# Patient Record
Sex: Female | Born: 1945
Health system: Southern US, Community
[De-identification: ages and names within clinical notes are randomized; demographics above are authoritative.]

## PROBLEM LIST (undated history)

## (undated) DIAGNOSIS — Z803 Family history of malignant neoplasm of breast: Secondary | ICD-10-CM

## (undated) DIAGNOSIS — A599 Trichomoniasis, unspecified: Secondary | ICD-10-CM

## (undated) DIAGNOSIS — N882 Stricture and stenosis of cervix uteri: Secondary | ICD-10-CM

## (undated) DIAGNOSIS — Z8619 Personal history of other infectious and parasitic diseases: Secondary | ICD-10-CM

## (undated) DIAGNOSIS — Z87898 Personal history of other specified conditions: Secondary | ICD-10-CM

## (undated) DIAGNOSIS — B379 Candidiasis, unspecified: Secondary | ICD-10-CM

## (undated) DIAGNOSIS — Z8042 Family history of malignant neoplasm of prostate: Secondary | ICD-10-CM

## (undated) DIAGNOSIS — F439 Reaction to severe stress, unspecified: Secondary | ICD-10-CM

## (undated) DIAGNOSIS — I10 Essential (primary) hypertension: Secondary | ICD-10-CM

## (undated) DIAGNOSIS — Z800 Family history of malignant neoplasm of digestive organs: Secondary | ICD-10-CM

## (undated) DIAGNOSIS — C801 Malignant (primary) neoplasm, unspecified: Secondary | ICD-10-CM

## (undated) DIAGNOSIS — D219 Benign neoplasm of connective and other soft tissue, unspecified: Secondary | ICD-10-CM

## (undated) DIAGNOSIS — N950 Postmenopausal bleeding: Secondary | ICD-10-CM

## (undated) HISTORY — PX: WISDOM TOOTH EXTRACTION: SHX21

## (undated) HISTORY — DX: Benign neoplasm of connective and other soft tissue, unspecified: D21.9

## (undated) HISTORY — DX: Postmenopausal bleeding: N95.0

## (undated) HISTORY — DX: Family history of malignant neoplasm of prostate: Z80.42

## (undated) HISTORY — DX: Malignant (primary) neoplasm, unspecified: C80.1

## (undated) HISTORY — DX: Reaction to severe stress, unspecified: F43.9

## (undated) HISTORY — DX: Family history of malignant neoplasm of breast: Z80.3

## (undated) HISTORY — DX: Personal history of other specified conditions: Z87.898

## (undated) HISTORY — DX: Candidiasis, unspecified: B37.9

## (undated) HISTORY — DX: Stricture and stenosis of cervix uteri: N88.2

## (undated) HISTORY — DX: Personal history of other infectious and parasitic diseases: Z86.19

## (undated) HISTORY — DX: Family history of malignant neoplasm of digestive organs: Z80.0

## (undated) HISTORY — PX: BREAST SURGERY: SHX581

## (undated) HISTORY — DX: Essential (primary) hypertension: I10

## (undated) HISTORY — DX: Trichomoniasis, unspecified: A59.9

---

## 1977-01-13 HISTORY — PX: TUBAL LIGATION: SHX77

## 1994-01-13 HISTORY — PX: INGUINAL HERNIA REPAIR: SUR1180

## 1999-05-15 ENCOUNTER — Other Ambulatory Visit: Admission: RE | Admit: 1999-05-15 | Discharge: 1999-05-15 | Payer: Self-pay | Admitting: Gynecology

## 2001-11-08 ENCOUNTER — Other Ambulatory Visit: Admission: RE | Admit: 2001-11-08 | Discharge: 2001-11-08 | Payer: Self-pay | Admitting: Gynecology

## 2002-06-08 ENCOUNTER — Encounter: Payer: Self-pay | Admitting: Obstetrics and Gynecology

## 2002-06-08 ENCOUNTER — Ambulatory Visit (HOSPITAL_COMMUNITY): Admission: RE | Admit: 2002-06-08 | Discharge: 2002-06-08 | Payer: Self-pay | Admitting: Obstetrics and Gynecology

## 2003-01-14 DIAGNOSIS — N950 Postmenopausal bleeding: Secondary | ICD-10-CM

## 2003-01-14 HISTORY — DX: Postmenopausal bleeding: N95.0

## 2003-04-18 ENCOUNTER — Other Ambulatory Visit: Admission: RE | Admit: 2003-04-18 | Discharge: 2003-04-18 | Payer: Self-pay | Admitting: Gynecology

## 2003-05-08 DIAGNOSIS — A599 Trichomoniasis, unspecified: Secondary | ICD-10-CM

## 2003-05-08 HISTORY — DX: Trichomoniasis, unspecified: A59.9

## 2003-07-08 ENCOUNTER — Encounter (INDEPENDENT_AMBULATORY_CARE_PROVIDER_SITE_OTHER): Payer: Self-pay | Admitting: Specialist

## 2003-07-08 ENCOUNTER — Ambulatory Visit (HOSPITAL_COMMUNITY): Admission: RE | Admit: 2003-07-08 | Discharge: 2003-07-08 | Payer: Self-pay | Admitting: Obstetrics and Gynecology

## 2003-07-08 DIAGNOSIS — N882 Stricture and stenosis of cervix uteri: Secondary | ICD-10-CM

## 2003-07-08 HISTORY — PX: HYSTEROSCOPY: SHX211

## 2003-07-08 HISTORY — PX: DILATION AND CURETTAGE OF UTERUS: SHX78

## 2003-07-08 HISTORY — DX: Stricture and stenosis of cervix uteri: N88.2

## 2004-12-03 ENCOUNTER — Other Ambulatory Visit: Admission: RE | Admit: 2004-12-03 | Discharge: 2004-12-03 | Payer: Self-pay | Admitting: Obstetrics and Gynecology

## 2004-12-09 ENCOUNTER — Ambulatory Visit (HOSPITAL_COMMUNITY): Admission: RE | Admit: 2004-12-09 | Discharge: 2004-12-09 | Payer: Self-pay | Admitting: Gastroenterology

## 2005-12-09 ENCOUNTER — Other Ambulatory Visit: Admission: RE | Admit: 2005-12-09 | Discharge: 2005-12-09 | Payer: Self-pay | Admitting: Obstetrics and Gynecology

## 2006-12-15 ENCOUNTER — Telehealth: Payer: Self-pay | Admitting: Family Medicine

## 2006-12-23 ENCOUNTER — Ambulatory Visit: Payer: Self-pay | Admitting: Family Medicine

## 2006-12-23 DIAGNOSIS — I10 Essential (primary) hypertension: Secondary | ICD-10-CM | POA: Insufficient documentation

## 2006-12-23 DIAGNOSIS — Z9189 Other specified personal risk factors, not elsewhere classified: Secondary | ICD-10-CM

## 2008-05-18 ENCOUNTER — Telehealth (INDEPENDENT_AMBULATORY_CARE_PROVIDER_SITE_OTHER): Payer: Self-pay | Admitting: *Deleted

## 2008-05-22 ENCOUNTER — Ambulatory Visit: Payer: Self-pay | Admitting: Family Medicine

## 2009-06-20 DIAGNOSIS — F439 Reaction to severe stress, unspecified: Secondary | ICD-10-CM

## 2009-06-20 HISTORY — DX: Reaction to severe stress, unspecified: F43.9

## 2010-02-14 NOTE — Assessment & Plan Note (Signed)
Summary: blood pressure/mhf/NEW ACUTE/CCM   Vital Signs:  Patient Profile:   65 Years Old Female Weight:      183 pounds Temp:     98.6 degrees F oral Pulse rate:   100 / minute BP sitting:   134 / 88  (left arm) Cuff size:   large  Vitals Entered By: Yolanda Green, CMA (December 23, 2006 4:17 PM)                 Chief Complaint:  bp check and renew meds.  History of Present Illness: 65 yr old female here to re- establish with [place] and to check her BP. Feels fine. Last BP check at work last month had asystolic reading in 140's. Sees Dr. Stefano Green for gyn exams.  Current Allergies: No known allergies   Past Medical History:    Reviewed history and no changes required:       Hypertension  Past Surgical History:    Reviewed history and no changes required:       umbilical hernia repair       colonoscopy 2008, clear per Dr. Elsie Green   Family History:    Reviewed history and no changes required:       Family History of Arthritis       Family History Hypertension  Social History:    Reviewed history and no changes required:       Married       Never Smoked       Alcohol use-yes   Risk Factors:  Tobacco use:  never Alcohol use:  yes    Physical Exam  General:     overweight-appearing.   Neck:     No deformities, masses, or tenderness noted. Lungs:     Normal respiratory effort, chest expands symmetrically. Lungs are clear to auscultation, no crackles or wheezes. Heart:     Normal rate and regular rhythm. S1 and S2 normal without gallop, murmur, click, rub or other extra sounds.    Impression & Recommendations:  Problem # 1:  HYPERTENSION (ICD-401.9)  Her updated medication list for this problem includes:    Hydrochlorothiazide 25 Mg Tabs (Hydrochlorothiazide) ..... Once daily   Complete Medication List: 1)  Hydrochlorothiazide 25 Mg Tabs (Hydrochlorothiazide) .... Once daily   Patient Instructions: 1)  After the holidays she will schedule a  full cpx, get fasting labs, etc.    Prescriptions: HYDROCHLOROTHIAZIDE 25 MG  TABS (HYDROCHLOROTHIAZIDE) once daily  #30 x 11   Entered and Authorized by:   Yolanda Salisbury MD   Signed by:   Yolanda Salisbury MD on 12/23/2006   Method used:   Elect[facility] ...       [id number]       [address]       [place], [place]  [address]       Ph: [phone] or [phone]       Fax: [phone]   RxID:   [id number]  ]

## 2010-02-14 NOTE — Progress Notes (Signed)
Summary: refill  Phone Note Call from Patient   Caller: Patient Call For: fry Summary of Call: patient has appt for Monday 12 8  please call in some  hydrochlorithizide [facility] [address] [phone] Initial call taken by: Roselle Locus,  December 15, 2006 4:05 PM  Follow-up for Phone Call        Phone Call Completed, Rx Called In Follow-up by: Alfred Levins, CMA,  December 16, 2006 1:15 PM    New/Updated Medications: HYDROCHLOROTHIAZIDE 25 MG  TABS (HYDROCHLOROTHIAZIDE) Take 1 tab by mouth every morning needs ov   Prescriptions: HYDROCHLOROTHIAZIDE 25 MG  TABS (HYDROCHLOROTHIAZIDE) Take 1 tab by mouth every morning needs ov  #30 x 0   Entered by:   Alfred Levins, CMA   Authorized by:   Nelwyn Salisbury MD   Signed by:   Alfred Levins, CMA on 12/16/2006   Method used:   Electronically sent to ...       [facility]  [address] [id number]       [address]       [place], [place]  [address]       Ph: [phone] or [phone]       Fax: [phone]   RxID:   [id number]

## 2010-02-14 NOTE — Letter (Signed)
Summary: patient history  patient history   Imported By: Kassie Mends 01/05/2007 08:24:44  _____________________________________________________________________  External Attachment:    Type:   Image     Comment:   patient history

## 2010-02-14 NOTE — Assessment & Plan Note (Signed)
Summary: fu on bp/nrj   Vital Signs:  Patient profile:   65 year old female Height:      66 inches Weight:      179 pounds BMI:     29.00 Temp:     97.7 degrees F oral Pulse rate:   95 / minute BP sitting:   142 / 72  (left arm) Cuff size:   regular  Vitals Entered By: Alfred Levins, CMA (May 22, 2008 11:09 AM) CC: bp check   History of Present Illness: Here to recheck HTN. Feels good, but has not seen [place] for over 18 months. She has been busy with family issues. Her husband just had a bone marrow transplant for multiple myeloma, and she is helping her sister care for their mother with dementia. Now she wants to focus more on herself again.   Allergies: No Known Drug Allergies  Past History:  Past Medical History:    Hypertension    sees Dr. Stefano Gaul for GYN exams  Past Surgical History:    Reviewed history from 12/23/2006 and no changes required:    umbilical hernia repair    colonoscopy 2008, clear per Dr. Elsie Amis  Review of Systems  The patient denies anorexia, fever, weight loss, weight gain, vision loss, decreased hearing, hoarseness, chest pain, syncope, dyspnea on exertion, peripheral edema, prolonged cough, headaches, hemoptysis, abdominal pain, melena, hematochezia, severe indigestion/heartburn, hematuria, incontinence, genital sores, muscle weakness, suspicious skin lesions, transient blindness, difficulty walking, depression, unusual weight change, abnormal bleeding, enlarged lymph nodes, angioedema, breast masses, and testicular masses.    Physical Exam  General:  Well-developed,well-nourished,in no acute distress; alert,appropriate and cooperative throughout examination Neck:  No deformities, masses, or tenderness noted. Lungs:  Normal respiratory effort, chest expands symmetrically. Lungs are clear to auscultation, no crackles or wheezes. Heart:  Normal rate and regular rhythm. S1 and S2 normal without gallop, murmur, click, rub or other extra  sounds.   Impression & Recommendations:  Problem # 1:  HYPERTENSION (ICD-401.9)  Her updated medication list for this problem includes:    Hydrochlorothiazide 25 Mg Tabs (Hydrochlorothiazide) ..... Once daily  Complete Medication List: 1)  Hydrochlorothiazide 25 Mg Tabs (Hydrochlorothiazide) .... Once daily  Patient Instructions: 1)  Refilled her meds. She will set up a cpx soon.  Prescriptions: HYDROCHLOROTHIAZIDE 25 MG  TABS (HYDROCHLOROTHIAZIDE) once daily  #30 x 11   Entered and Authorized by:   Nelwyn Salisbury MD   Signed by:   Nelwyn Salisbury MD on 05/22/2008   Method used:   [facility]ronically to        [phone][facility] (retail)       [address]       [place], [place]  [address]       Ph: [phone] or [phone]       Fax: [phone]   RxID:   [id number]

## 2010-02-14 NOTE — Progress Notes (Signed)
Summary: med refill today  Phone Note Call from Patient Call back at Home Phone [phone]   Caller: Patient Call For: Nelwyn Salisbury MD Summary of Call: pt needs refill on hctz [facility] [phone]. pt has ov sch for 05-22-2008 pt has been out of meds for 2 wk. Initial call taken by: Heron Sabins,  May 18, 2008 11:56 AM  Follow-up for Phone Call        Phone Call Completed, Rx Called In Follow-up by: Alfred Levins, CMA,  May 18, 2008 12:13 PM      Prescriptions: HYDROCHLOROTHIAZIDE 25 MG  TABS (HYDROCHLOROTHIAZIDE) once daily  #30 x 0   Entered by:   Alfred Levins, CMA   Authorized by:   Nelwyn Salisbury MD   Signed by:   Alfred Levins, CMA on 05/18/2008   Method used:   Electronically to        [facility] [phone]* (retail)       [address]       [place], [place]  [address]       Ph: [phone] or [phone]       Fax: [phone]   RxID:   [id number]

## 2010-04-26 ENCOUNTER — Other Ambulatory Visit: Payer: Self-pay

## 2010-04-26 NOTE — Telephone Encounter (Signed)
Refill hctz 25 mg Last OV 2010

## 2010-04-30 NOTE — Telephone Encounter (Signed)
rx request faxed back to [facility]

## 2010-04-30 NOTE — Telephone Encounter (Signed)
No, she needs an OV

## 2010-05-31 NOTE — H&P (Signed)
NAMEHARUE, PRIBBLE                      ACCOUNT NO.:  [id number]   MEDICAL RECORD NO.:  [id number]                   PATIENT TYPE:  AMB   LOCATION:  SDC                                  FACILITY:  [facility]   PHYSICIAN:  Janine Limbo, M.D.            DATE OF BIRTH:  1946/01/14   DATE OF ADMISSION:  07/09/2003  DATE OF DISCHARGE:                                HISTORY & PHYSICAL   HISTORY OF PRESENT ILLNESS:  Yolanda Green is a 65 year old female, para 2-0-  0-1, who presents for hysteroscopy with dilatation and curettage because of  postmenopausal bleeding.  The patient had an attempted hydrosonogram in the  office where she was found to have a stenotic cervix. The patient has a  known history of fibroids.   DRUG ALLERGIES:  None known.   PAST OBSTETRICAL HISTORY:  The patient has had two term vaginal deliveries.   PAST MEDICAL HISTORY:  1. History of hypertension, and she takes hydrochlorothiazide.  2. She has had wisdom teeth removed.   SOCIAL HISTORY:  The patient drinks alcohol socially.  She denies cigarette  use and recreational drug use.  She is married, and she works as a Public librarian.   REVIEW OF SYSTEMS:  Noncontributory.   FAMILY HISTORY:  The patient has a family history of heart disease, cancer,  hypertension, and diabetes.   PHYSICAL EXAMINATION:  VITAL SIGNS:  Weight 169 pounds.  HEENT:  Within normal limits.  CHEST: Clear.  HEART:  Regular rate and rhythm.  BREASTS:  Without masses.  ABDOMEN:  Nontender.  EXTREMITIES:  Within normal limits.  NEUROLOGIC:  Grossly normal.  PELVIC:  External genitalia is normal.  Vagina is normal except for  relaxation.  The cervix is nontender, but the os is stenotic.  The uterus is  normal size, shape, and consistency. Adnexa: No masses.  Rectovaginal exam  confirms.   IMPRESSION:  1. Postmenopausal bleeding.  2. Stenotic cervix.   PLAN:  The patient will undergo a hysteroscopy with  dilatation and  curettage.  She understands the indications for her procedure, and she  accepts the risks of, but not limited to, anesthetic complications,  bleeding, infection, and possible damage to the surrounding organs.                                               Janine Limbo, M.D.    AVS/MEDQ  D:  07/06/2003  T:  07/06/2003  Job:  [id number]

## 2010-05-31 NOTE — Op Note (Signed)
Yolanda Green, Yolanda Green            ACCOUNT NO.:  [id number]   MEDICAL RECORD NO.:  [id number]          PATIENT TYPE:  AMB   LOCATION:  ENDO                         FACILITY:  [facility]   PHYSICIAN:  Anselmo Rod, M.D.  DATE OF BIRTH:  [birth year]   DATE OF PROCEDURE:  12/09/2004  DATE OF DISCHARGE:                                 OPERATIVE REPORT   PROCEDURE PERFORMED:  Screening colonoscopy.   ENDOSCOPIST:  Anselmo Rod, M.D.   INSTRUMENT USED:  Olympus video colonoscope.   INDICATION FOR PROCEDURE:  [age]-year-old African American female  undergoing screening colonoscopy to rule out colon polyps, masses, etc.   PREPROCEDURE PREPARATION:  Informed consent was procured from the patient.  The patient was fasted for 4 hours prior to the patient and prepped with  Osmoprep as well as the night and the morning of the procedure.  Risks and  benefits of the procedure including a 10% missed rate of cancer in polyps  were discussed with the patient as well.   PREPROCEDURE PHYSICAL:  VITAL SIGNS:  The patient had stable vital signs.  NECK:  Supple.  CHEST:  Clear to auscultation.  CARDIAC:  S1 and S2 regular.  ABDOMEN:  Soft with normal bowel sounds.   DESCRIPTION OF PROCEDURE:  The patient was placed in the left lateral  decubitus position and sedated with 75 mg of Demerol and 6 mg of Versed in  slow incremental doses.  Once the patient was adequately sedated and  maintained on low-flow oxygen and continuous cardiac monitoring, the Olympus  video colonoscope was advanced from the rectum to the cecum.  The  appendiceal orifice and the ileocecal valve were clearly visualized and  photographed.  Small internal hemorrhoid were seen on retroflexion in the  rectum.  There were a couple of cecal diverticula present with stool in  them; the rest of the exam was unremarkable.  The patient tolerated the  procedure well without immediate complications.   IMPRESSION:  1.  Two small cecal  diverticula with stool noted, otherwise normal      colonoscopy up to the cecum.  2.  Small internal hemorrhoids seen on retroflexion.   RECOMMENDATION:  1.  Continue a high-fiber diet with liberal fluid intake.  Brochures on      diverticulosis have been given to the patient for education.  2.  Repeat colonoscopy has been recommended in the next 10 years, unless the      patient develops any abnormal symptoms in the interim.  3.  Outpatient followup has been advanced if needed.      Anselmo Rod, M.D.  Electronically Signed     JNM/MEDQ  D:  12/09/2004  T:  12/10/2004  Job:  [id number]   cc:   Janine Limbo, M.D.  Fax: [phone]

## 2010-05-31 NOTE — Op Note (Signed)
Yolanda Green, Yolanda Green                      ACCOUNT NO.:  [id number]   MEDICAL RECORD NO.:  [id number]                   PATIENT TYPE:  AMB   LOCATION:  SDC                                  [facility]   PHYSICIAN:  Janine Limbo, M.D.            DATE OF BIRTH:  1945-01-26   DATE OF PROCEDURE:  07/08/2003  DATE OF DISCHARGE:                                 OPERATIVE REPORT   PREOPERATIVE DIAGNOSES:  1. Postmenopausal bleeding.  2. Stenotic cervix.   POSTOPERATIVE DIAGNOSES:  1. Postmenopausal bleeding.  2. Stenotic cervix.   PROCEDURE:  1. Hysteroscopy.  2. Dilatation and curettage.   SURGEON:  Janine Limbo, M.D.   ANESTHESIA:  General.   DISPOSITION:  [name] is a 65 year old female, para 2-0-0-2, who  presents with postmenopausal bleeding.  Hydrosonogram and endometrial biopsy  were attempted in the office but we were unsuccessful because of a stenotic  cervix. The patient elected to proceed with operative hysteroscopy and  dilatation and curettage. The patient understands the indications for her  procedure and she accepts the associated risks.   FINDINGS:  The uterus was normal size.  No adnexal masses were appreciated  on exam.  On hysteroscopy, the endometrial cavity was noted to be clean and  no lesions were noted. The uterus sounded to 7.5 cm.   DESCRIPTION OF PROCEDURE:  The patient was taken to the operating room where  a general anesthetic was given. The patient's perineum and vagina were  prepped with multiple layers of Betadine.  The bladder was drained of urine.  The patient was then sterilely draped. A paracervical block was placed using  10 mL of 0.5% Marcaine with epinephrine.  An endocervical curettage was then  performed. The uterus was sounded to 7.5 cm. The cervix was gradually  dilated.  The uterine cavity was then evaluated using the diagnostic  hysteroscope.  No lesions were noted. The hysteroscope was removed and the  cavity  was sharply curetted.  The cavity was thought to be clean at the end  of our procedure.  Hemostasis was adequate.  The hysteroscope was reinserted  and the cavity was inspected. Again no lesions were noted.  No pathology or  injury was noted.  Pictures were taken.  All instruments were removed. The  patient was given 30 mg of IV Toradol.  The patient had her exam repeated  and the uterus was noted to be firm.  The patient was returned to the supine  position and then awakened from her anesthetic.  Sponge, needle and  instrument counts were correct on two occasions.  The estimated blood loss  was 5 mL.  The estimated fluid deficit was 20 mL.  The patient was taken to  the recovery room in stable condition.   FOLLOW UP:  The patient was given a prescription for Darvocet and she will  take 1 or 2 tablets every 4 hours as needed  for pain.  She will return to  see Dr. Stefano Gaul in 2-3 weeks for followup examination. She will call for  questions or concerns. She was given a copy of the postoperative instruction  sheet as prepared by the [facility] for patients who  have undergone a dilatation and curettage.                                               Janine Limbo, M.D.    AVS/MEDQ  D:  07/08/2003  T:  07/08/2003  Job:  [id number]

## 2010-07-12 ENCOUNTER — Other Ambulatory Visit: Payer: Self-pay | Admitting: Family Medicine

## 2011-06-13 ENCOUNTER — Other Ambulatory Visit: Payer: Self-pay | Admitting: Family Medicine

## 2011-06-16 NOTE — Telephone Encounter (Signed)
Can we refill this? 

## 2011-07-07 ENCOUNTER — Other Ambulatory Visit: Payer: Self-pay | Admitting: Family Medicine

## 2011-07-07 ENCOUNTER — Telehealth: Payer: Self-pay | Admitting: Family Medicine

## 2011-07-07 MED ORDER — HYDROCHLOROTHIAZIDE 25 MG PO TABS: 25.0000 mg | ORAL_TABLET | Freq: Every day | ORAL | Status: DC

## 2011-07-07 NOTE — Telephone Encounter (Signed)
Call in #30 only. She needs an OV soon  

## 2011-07-07 NOTE — Telephone Encounter (Signed)
Patient called stating that her pharmacy [facility], has been trying to get a refill of her hctz and she has been without her htn meds. Please assist.

## 2011-07-07 NOTE — Telephone Encounter (Signed)
I sent script e-scribe. 

## 2011-07-07 NOTE — Telephone Encounter (Signed)
Can we refill this? 

## 2011-07-21 ENCOUNTER — Encounter: Payer: Self-pay | Admitting: Obstetrics and Gynecology

## 2011-08-15 ENCOUNTER — Other Ambulatory Visit: Payer: Self-pay | Admitting: Family Medicine

## 2011-08-25 ENCOUNTER — Encounter: Payer: Self-pay | Admitting: Obstetrics and Gynecology

## 2011-08-25 ENCOUNTER — Ambulatory Visit (INDEPENDENT_AMBULATORY_CARE_PROVIDER_SITE_OTHER): Payer: Commercial Indemnity | Admitting: Obstetrics and Gynecology

## 2011-08-25 VITALS — BP 130/82 | HR 70 | Ht 66.0 in | Wt 178.0 lb

## 2011-08-25 DIAGNOSIS — Z124 Encounter for screening for malignant neoplasm of cervix: Secondary | ICD-10-CM

## 2011-08-25 DIAGNOSIS — I10 Essential (primary) hypertension: Secondary | ICD-10-CM

## 2011-08-25 DIAGNOSIS — Z01419 Encounter for gynecological examination (general) (routine) without abnormal findings: Secondary | ICD-10-CM

## 2011-08-25 NOTE — Progress Notes (Signed)
  Regular Periods: no Mammogram: yes  Monthly Breast Ex.: no Exercise: yes  Tetanus < 10 years: yes Seatbelts: yes  NI. Bladder Functn.: yes Abuse at home: no  Daily BM's: yes Stressful Work: no  Healthy Diet: yes Sigmoid-Colonoscopy: 5 years ago  Calcium: yes Medical problems this year: none   LAST PAP:06/20/09  Contraception: postmenopausal  Mammogram:  June 2013  PCP: none  PMH: none  FMH: none  Last Bone Scan: n/a  Pt requests Pap, needs refill for HCTZ. Pt is concerned about dense breast letter.  Subjective:    Yolanda Green is a 66 y.o. female G2P2002 who presents for annual exam. The patient has no complaints today. Her mammogram showed dense breast. The patient has a history of hypertension.  The following portions of the patient's history were reviewed and updated as appropriate: allergies, current medications, past family history, past medical history, past social history, past surgical history and problem list. See above.  Review of Systems Pertinent items are noted in HPI. Gastrointestinal:No change in bowel habits, no abdominal pain, no rectal bleeding Genitourinary:negative for dysuria, frequency, hematuria, nocturia and urinary incontinence    Objective:     BP 130/82  Pulse 70  Ht 5\' 6"  (1.676 m)  Wt 178 lb (80.74 kg)  BMI 28.73 kg/m2  Weight:  Wt Readings from Last 1 Encounters:  08/25/11 178 lb (80.74 kg)     BMI: Body mass index is 28.73 kg/(m^2). General Appearance: Alert, appropriate appearance for age. No acute distress HEENT: Grossly normal Neck / Thyroid: Supple, no masses, nodes or enlargement Lungs: clear to auscultation bilaterally Back: No CVA tenderness Breast Exam: No masses or nodes.No dimpling, nipple retraction or discharge. Cardiovascular: Regular rate and rhythm. S1, S2, no murmur Gastrointestinal: Soft, non-tender, no masses or organomegaly  ++++++++++++++++++++++++++++++++++++++++++++++++++++++++  Pelvic Exam: External  genitalia: normal general appearance Vaginal: normal without tenderness, induration or masses and relaxation noted Cervix: normal appearance Adnexa: normal bimanual exam Uterus: normal size shape and consistency Rectovaginal: normal rectal, no masses  ++++++++++++++++++++++++++++++++++++++++++++++++++++++++  Lymphatic Exam: Non-palpable nodes in neck, clavicular, axillary, or inguinal regions  Psychiatric: Alert and oriented, appropriate affect.    Urinalysis:Not done      Assessment:    Normal gyn exam   Overweight or obese: Yes  Pelvic relaxation: Yes  Menopausal symptoms: No. Severe: No.  Hypertension    Plan:    Pap smear.   Follow-up:  for annual exam  STD screen request: none  Dondra Spry model shows a 1.6% (normal) risk of breast cancer over the next 5 years.  I do not recommend ultrasound or MRI.  HCTZ 25 mg each day.  #60.  No refills.  The patient needs to see her family physician.  The updated Pap smear screening guidelines were discussed with the patient. The patient requested that I obtain a Pap smear: Yes.  Kegel exercises discussed: Yes.  Proper diet and regular exercise were reviewed.  Annual mammograms recommended starting at age 75. Proper breast care was discussed.  Screening colonoscopy is recommended beginning at age 40.  Regular health maintenance was reviewed.  Sleep hygiene was discussed.  Adequate calcium and vitamin D intake was emphasized.  Mylinda Latina.D.

## 2011-08-26 LAB — PAP IG W/ RFLX HPV ASCU

## 2011-08-26 MED ORDER — HYDROCHLOROTHIAZIDE 25 MG PO TABS: 25.0000 mg | ORAL_TABLET | Freq: Every day | ORAL | Status: DC

## 2012-06-19 ENCOUNTER — Ambulatory Visit (INDEPENDENT_AMBULATORY_CARE_PROVIDER_SITE_OTHER): Payer: Managed Care, Other (non HMO) | Admitting: Family Medicine

## 2012-06-19 VITALS — BP 154/80 | HR 98 | Temp 98.0°F | Resp 18 | Ht 66.5 in | Wt 177.0 lb

## 2012-06-19 DIAGNOSIS — R059 Cough, unspecified: Secondary | ICD-10-CM

## 2012-06-19 DIAGNOSIS — I10 Essential (primary) hypertension: Secondary | ICD-10-CM

## 2012-06-19 DIAGNOSIS — J069 Acute upper respiratory infection, unspecified: Secondary | ICD-10-CM

## 2012-06-19 DIAGNOSIS — R05 Cough: Secondary | ICD-10-CM

## 2012-06-19 DIAGNOSIS — J309 Allergic rhinitis, unspecified: Secondary | ICD-10-CM

## 2012-06-19 LAB — BASIC METABOLIC PANEL
BUN: 16 mg/dL (ref 6–23)
CO2: 25 mEq/L (ref 19–32)
Calcium: 9.6 mg/dL (ref 8.4–10.5)
Chloride: 103 mEq/L (ref 96–112)
Creat: 0.95 mg/dL (ref 0.50–1.10)
Glucose, Bld: 105 mg/dL — ABNORMAL HIGH (ref 70–99)
Potassium: 4.4 mEq/L (ref 3.5–5.3)
Sodium: 137 mEq/L (ref 135–145)

## 2012-06-19 MED ORDER — HYDROCODONE-HOMATROPINE 5-1.5 MG/5ML PO SYRP: 5.0000 mL | ORAL_SOLUTION | Freq: Every evening | ORAL | Status: DC | PRN

## 2012-06-19 MED ORDER — BENZONATATE 100 MG PO CAPS: 200.0000 mg | ORAL_CAPSULE | Freq: Two times a day (BID) | ORAL | Status: DC | PRN

## 2012-06-19 MED ORDER — HYDROCHLOROTHIAZIDE 25 MG PO TABS: 25.0000 mg | ORAL_TABLET | Freq: Every day | ORAL | Status: DC

## 2012-06-19 MED ORDER — FLUTICASONE PROPIONATE 50 MCG/ACT NA SUSP: 2.0000 | Freq: Every day | NASAL | Status: DC

## 2012-06-19 NOTE — Patient Instructions (Addendum)

## 2012-06-19 NOTE — Progress Notes (Signed)
[facility]:  Office Visit  Chief Complaint:  Chief Complaint  Patient presents with  . Cough    started last week- yellow sputum  . Sore Throat    HPI: Yolanda Green is a 67 y.o. female who complains of  1 week history of sore , scratchy throat, took Emergency-c without relief, was feelign better then Thursday night sxs came back. She has sore throat, yellow mucus, this AM coughing. No h/o allergies or asthma. No fevers or chills. She denies facial pain, Ear pain. Caught it from her mother. She was told that her mom had bronchitis. Does not have SOB, wheezing. Not as bad as last night.   HTN-has not been on meds for 1 year. She did not have any SEs from the medicine. She is "so so scared of needles". She does not come in for HTN checks because of blood work.   Past Medical History  Diagnosis Date  . Hypertension   . PMB (postmenopausal bleeding) 2005  . Stenotic cervical os 07/08/03  . Fibroids   . H/O varicella   . History of measles, mumps, or rubella   . Yeast infection   . Trichomonas 05/08/03  . Stress 06/20/09   Past Surgical History  Procedure Laterality Date  . Inguinal hernia repair  1996  . Tubal ligation  1979  . Dilation and curettage of uterus  07/08/03  . Hysteroscopy  07/08/03  . Wisdom tooth extraction     History   Social History  . Marital Status: Married    Spouse Name: N/A    Number of Children: N/A  . Years of Education: N/A   Social History Main Topics  . Smoking status: Never Smoker   . Smokeless tobacco: Never Used  . Alcohol Use: Yes     Comment: occasional glass of wine  . Drug Use: No  . Sexually Active: None     Comment: BTL   Other Topics Concern  . None   Social History Narrative  . None   Family History  Problem Relation Age of Onset  . Cancer Father     Prostate  . Diabetes Sister   . Hearing loss Son   . Cancer Paternal Aunt     3 aunts with breast cancer   No Known Allergies Prior to Admission  medications   Medication Sig Start Date End Date Taking? Authorizing Provider  hydrochlorothiazide (HYDRODIURIL) 25 MG tablet Take 1 tablet (25 mg total) by mouth daily. 08/26/11  Yes Kirkland Hun, MD  Multiple Vitamins-Minerals (MULTIVITAMIN PO) Take by mouth.   Yes Historical Provider, MD     ROS: The patient denies fevers, chills, night sweats, unintentional weight loss, chest pain, palpitations, wheezing, dyspnea on exertion, nausea, vomiting, abdominal pain, dysuria, hematuria, melena, numbness, weakness, or tingling.   All other systems have been reviewed and were otherwise negative with the exception of those mentioned in the HPI and as above.    PHYSICAL EXAM: Filed Vitals:   06/19/12 0923  BP: 154/80  Pulse: 98  Temp: 98 F (36.7 C)  Resp: 18   Filed Vitals:   06/19/12 0923  Height: 5' 6.5" (1.689 m)  Weight: 177 lb (80.287 kg)   Body mass index is 28.14 kg/(m^2).  General: Alert, no acute distress HEENT:  Normocephalic, atraumatic, oropharynx patent.  Cardiovascular:  Regular rate and rhythm, no rubs murmurs or gallops.  No Carotid bruits, radial pulse intact. No pedal edema.  Respiratory: Clear to auscultation  bilaterally.  No wheezes, rales, or rhonchi.  No cyanosis, no use of accessory musculature GI: No organomegaly, abdomen is soft and non-tender, positive bowel sounds.  No masses. Skin: No rashes. Neurologic: Facial musculature symmetric. Psychiatric: Patient is appropriate throughout our interaction. Lymphatic: No cervical lymphadenopathy Musculoskeletal: Gait intact.   LABS: Results for orders placed in visit on 08/25/11  PAP IG W/ RFLX HPV ASCU      Result Value Range   Specimen adequacy:       FINAL DIAGNOSIS:       COMMENTS:       Cytotechnologist:         EKG/XRAY:   Primary read interpreted by Dr. Conley Rolls at [facility].   ASSESSMENT/PLAN: Encounter Diagnoses  Name Primary?  . HTN (hypertension) Yes  . Allergic rhinitis   . Cough   . Viral URI     Refilled HCTZ for HTN Patient has been noncompliant with meds. Has not had them in 1 year. She is afraid of needles. We discussed changing her meds so she does not have to get bloodwork as frequently, she declined since she knows the HCTZ works. Viral cold. Sxs treatment for now Rx flonase, tussionex, and hycodan  F/u prn   LE, THAO PHUONG, DO 06/19/2012 11:00 AM

## 2012-12-30 DIAGNOSIS — Z1231 Encounter for screening mammogram for malignant neoplasm of breast: Secondary | ICD-10-CM | POA: Diagnosis not present

## 2013-05-07 ENCOUNTER — Other Ambulatory Visit: Payer: Self-pay | Admitting: Family Medicine

## 2013-08-19 ENCOUNTER — Ambulatory Visit (INDEPENDENT_AMBULATORY_CARE_PROVIDER_SITE_OTHER): Payer: Medicare Other | Admitting: Internal Medicine

## 2013-08-19 ENCOUNTER — Ambulatory Visit (INDEPENDENT_AMBULATORY_CARE_PROVIDER_SITE_OTHER): Payer: Medicare Other

## 2013-08-19 VITALS — BP 138/82 | HR 71 | Temp 98.0°F | Resp 18 | Ht 65.0 in | Wt 179.0 lb

## 2013-08-19 DIAGNOSIS — M25519 Pain in unspecified shoulder: Secondary | ICD-10-CM | POA: Diagnosis not present

## 2013-08-19 DIAGNOSIS — M25522 Pain in left elbow: Secondary | ICD-10-CM

## 2013-08-19 DIAGNOSIS — M25529 Pain in unspecified elbow: Secondary | ICD-10-CM | POA: Diagnosis not present

## 2013-08-19 DIAGNOSIS — M25512 Pain in left shoulder: Secondary | ICD-10-CM

## 2013-08-19 DIAGNOSIS — W010XXA Fall on same level from slipping, tripping and stumbling without subsequent striking against object, initial encounter: Secondary | ICD-10-CM

## 2013-08-19 DIAGNOSIS — M25469 Effusion, unspecified knee: Secondary | ICD-10-CM | POA: Diagnosis not present

## 2013-08-19 MED ORDER — HYDROCODONE-ACETAMINOPHEN 5-325 MG PO TABS: 1.0000 | ORAL_TABLET | Freq: Four times a day (QID) | ORAL | Status: DC | PRN

## 2013-08-19 NOTE — Patient Instructions (Addendum)
Humerus Fracture, Treated with Immobilization The humerus is the large bone in your upper arm. You have a broken (fractured) humerus. These fractures are easily diagnosed with X-rays. TREATMENT  Simple fractures which will heal without disability are treated with simple immobilization. Immobilization means you will wear a cast, splint, or sling. You have a fracture which will do well with immobilization. The fracture will heal well simply by being held in a good position until it is stable enough to begin range of motion exercises. Do not take part in activities which would further injure your arm.  HOME CARE INSTRUCTIONS   Put ice on the injured area.  Put ice in a plastic bag.  Place a towel between your skin and the bag.  Leave the ice on for 15-20 minutes, 03-04 times a day.  If you have a cast:  Do not scratch the skin under the cast using sharp or pointed objects.  Check the skin around the cast every day. You may put lotion on any red or sore areas.  Keep your cast dry and clean.  If you have a splint:  Wear the splint as directed.  Keep your splint dry and clean.  You may loosen the elastic around the splint if your fingers become numb, tingle, or turn cold or blue.  If you have a sling:  Wear the sling as directed.  Do not put pressure on any part of your cast or splint until it is fully hardened.  Your cast or splint can be protected during bathing with a plastic bag. Do not lower the cast or splint into water.  Only take over-the-counter or prescription medicines for pain, discomfort, or fever as directed by your caregiver.  Do range of motion exercises as instructed by your caregiver.  Follow up as directed by your caregiver. This is very important in order to avoid permanent injury or disability and chronic pain. SEEK IMMEDIATE MEDICAL CARE IF:   Your skin or nails in the injured arm turn blue or gray.  Your arm feels cold or numb.  You develop severe  pain in the injured arm.  You are having problems with the medicines you were given. MAKE SURE YOU:   Understand these instructions.  Will watch your condition.  Will get help right away if you are not doing well or get worse. Document Released: 04/07/2000 Document Revised: 03/24/2011 Document Reviewed: 02/13/2010 [facility] Patient Information 2015 [place], [place]. This information is not intended to replace advice given to you by your health care provider. Make sure you discuss any questions you have with your health care provider. Fall Prevention and Home Safety Falls cause injuries and can affect all age groups. It is possible to use preventive measures to significantly decrease the likelihood of falls. There are many simple measures which can make your home safer and prevent falls. OUTDOORS  Repair cracks and edges of walkways and driveways.  Remove high doorway thresholds.  Trim shrubbery on the main path into your home.  Have good outside lighting.  Clear walkways of tools, rocks, debris, and clutter.  Check that handrails are not broken and are securely fastened. Both sides of steps should have handrails.  Have leaves, snow, and ice cleared regularly.  Use sand or salt on walkways during winter months.  In the garage, clean up grease or oil spills. BATHROOM  Install night lights.  Install grab bars by the toilet and in the tub and shower.  Use non-skid mats or decals in the  tub or shower.  Place a plastic non-slip stool in the shower to sit on, if needed.  Keep floors dry and clean up all water on the floor immediately.  Remove soap buildup in the tub or shower on a regular basis.  Secure bath mats with non-slip, double-sided rug tape.  Remove throw rugs and tripping hazards from the floors. BEDROOMS  Install night lights.  Make sure a bedside light is easy to reach.  Do not use oversized bedding.  Keep a telephone by your bedside.  Have a firm chair  with side arms to use for getting dressed.  Remove throw rugs and tripping hazards from the floor. KITCHEN  Keep handles on pots and pans turned toward the center of the stove. Use back burners when possible.  Clean up spills quickly and allow time for drying.  Avoid walking on wet floors.  Avoid hot utensils and knives.  Position shelves so they are not too high or low.  Place commonly used objects within easy reach.  If necessary, use a sturdy step stool with a grab bar when reaching.  Keep electrical cables out of the way.  Do not use floor polish or wax that makes floors slippery. If you must use wax, use non-skid floor wax.  Remove throw rugs and tripping hazards from the floor. STAIRWAYS  Never leave objects on stairs.  Place handrails on both sides of stairways and use them. Fix any loose handrails. Make sure handrails on both sides of the stairways are as long as the stairs.  Check carpeting to make sure it is firmly attached along stairs. Make repairs to worn or loose carpet promptly.  Avoid placing throw rugs at the top or bottom of stairways, or properly secure the rug with carpet tape to prevent slippage. Get rid of throw rugs, if possible.  Have an electrician put in a light switch at the top and bottom of the stairs. OTHER FALL PREVENTION TIPS  Wear low-heel or rubber-soled shoes that are supportive and fit well. Wear closed toe shoes.  When using a stepladder, make sure it is fully opened and both spreaders are firmly locked. Do not climb a closed stepladder.  Add color or contrast paint or tape to grab bars and handrails in your home. Place contrasting color strips on first and last steps.  Learn and use mobility aids as needed. Install an electrical emergency response system.  Turn on lights to avoid dark areas. Replace light bulbs that burn out immediately. Get light switches that glow.  Arrange furniture to create clear pathways. Keep furniture in the  same place.  Firmly attach carpet with non-skid or double-sided tape.  Eliminate uneven floor surfaces.  Select a carpet pattern that does not visually hide the edge of steps.  Be aware of all pets. OTHER HOME SAFETY TIPS  Set the water temperature for 120 F (48.8 C).  Keep emergency numbers on or near the telephone.  Keep smoke detectors on every level of the home and near sleeping areas. Document Released: 12/20/2001 Document Revised: 07/01/2011 Document Reviewed: 03/21/2011 [facility] Patient Information 2015 [place], [place]. This information is not intended to replace advice given to you by your health care provider. Make sure you discuss any questions you have with your health care provider.   Your appointment is at [facility] We have given you a sheet with the directions attached to this notice.

## 2013-08-19 NOTE — Progress Notes (Signed)
   Subjective:    Patient ID: Yolanda Green, female    DOB: April 08, 1945, 68 y.o.   MRN: [id number]  HPI 68 year old female. Pt complains of left shoulder and upper arm pain. She fall getting out the shower trying to catch herself she used her arms to break her fall. This happened on 08/17/2013. Still no relief. She cannot lift or extend arm/shoulder. She has normal function in her hand and wrist. No previous in jury to this side and no phx of falls or fxs.    Review of Systems htn not taking her meds.    Objective:   Physical Exam  Vitals reviewed. Constitutional: She is oriented to person, place, and time. She appears well-developed and well-nourished. No distress.  HENT:  Head: Normocephalic and atraumatic.  Eyes: EOM are normal. Pupils are equal, round, and reactive to light.  Neck: Normal range of motion. Neck supple.  Pulmonary/Chest: Effort normal.  Musculoskeletal: She exhibits tenderness.       Left shoulder: She exhibits decreased range of motion, tenderness, bony tenderness, pain and spasm. She exhibits no swelling, no effusion, no crepitus, no deformity, no laceration and normal pulse.       Arms: Most painful, unable to test strength due to pain  Neurological: She is alert and oriented to person, place, and time. She exhibits normal muscle tone. Coordination normal.  Psychiatric: She has a normal mood and affect. Her behavior is normal. Thought content normal.  Unable to passively abduct or extend shoulder arm NMSV intact   [facility] reading (PRIMARY) by  Dr.Guest possible fx impacted humerus proximal, no dislocation seen  BP 138/82 much better     Assessment & Plan:  See your doctor about your BP and medicine Possible fx humerus impacted/or subluxed shoulder Refer to ortho/Sling/RICE/vicodinprn

## 2013-08-25 ENCOUNTER — Ambulatory Visit (INDEPENDENT_AMBULATORY_CARE_PROVIDER_SITE_OTHER): Payer: Medicare Other | Admitting: Family Medicine

## 2013-08-25 VITALS — BP 160/90 | HR 99 | Temp 98.0°F | Resp 16 | Ht 66.5 in | Wt 179.0 lb

## 2013-08-25 DIAGNOSIS — I10 Essential (primary) hypertension: Secondary | ICD-10-CM

## 2013-08-25 MED ORDER — AMLODIPINE BESYLATE 5 MG PO TABS: 5.0000 mg | ORAL_TABLET | Freq: Every day | ORAL | Status: DC

## 2013-08-25 NOTE — Patient Instructions (Signed)
DASH Eating Plan °DASH stands for "Dietary Approaches to Stop Hypertension." The DASH eating plan is a healthy eating plan that has been shown to reduce high blood pressure (hypertension). Additional health benefits may include reducing the risk of type 2 diabetes mellitus, heart disease, and stroke. The DASH eating plan may also help with weight loss. °WHAT DO I NEED TO KNOW ABOUT THE DASH EATING PLAN? °For the DASH eating plan, you will follow these general guidelines: °· Choose foods with a percent daily value for sodium of less than 5% (as listed on the food label). °· Use salt-free seasonings or herbs instead of table salt or sea salt. °· Check with your health care provider or pharmacist before using salt substitutes. °· Eat lower-sodium products, often labeled as "lower sodium" or "no salt added." °· Eat fresh foods. °· Eat more vegetables, fruits, and low-fat dairy products. °· Choose whole grains. Look for the word "whole" as the first word in the ingredient list. °· Choose fish and skinless chicken or turkey more often than red meat. Limit fish, poultry, and meat to 6 oz (170 g) each day. °· Limit sweets, desserts, sugars, and sugary drinks. °· Choose heart-healthy fats. °· Limit cheese to 1 oz (28 g) per day. °· Eat more home-cooked food and less restaurant, buffet, and fast food. °· Limit fried foods. °· Cook foods using methods other than frying. °· Limit canned vegetables. If you do use them, rinse them well to decrease the sodium. °· When eating at a restaurant, ask that your food be prepared with less salt, or no salt if possible. °WHAT FOODS CAN I EAT? °Seek help from a dietitian for individual calorie needs. °Grains °Whole grain or whole wheat bread. Brown rice. Whole grain or whole wheat pasta. Quinoa, bulgur, and whole grain cereals. Low-sodium cereals. Corn or whole wheat flour tortillas. Whole grain cornbread. Whole grain crackers. Low-sodium crackers. °Vegetables °Fresh or frozen vegetables  (raw, steamed, roasted, or grilled). Low-sodium or reduced-sodium tomato and vegetable juices. Low-sodium or reduced-sodium tomato sauce and paste. Low-sodium or reduced-sodium canned vegetables.  °Fruits °All fresh, canned (in natural juice), or frozen fruits. °Meat and Other Protein Products °Ground beef (85% or leaner), grass-fed beef, or beef trimmed of fat. Skinless chicken or turkey. Ground chicken or turkey. Pork trimmed of fat. All fish and seafood. Eggs. Dried beans, peas, or lentils. Unsalted nuts and seeds. Unsalted canned beans. °Dairy °Low-fat dairy products, such as skim or 1% milk, 2% or reduced-fat cheeses, low-fat ricotta or cottage cheese, or plain low-fat yogurt. Low-sodium or reduced-sodium cheeses. °Fats and Oils °Tub margarines without trans fats. Light or reduced-fat mayonnaise and salad dressings (reduced sodium). Avocado. Safflower, olive, or canola oils. Natural peanut or almond butter. °Other °Unsalted popcorn and pretzels. °The items listed above may not be a complete list of recommended foods or beverages. Contact your dietitian for more options. °WHAT FOODS ARE NOT RECOMMENDED? °Grains °White bread. White pasta. White rice. Refined cornbread. Bagels and croissants. Crackers that contain trans fat. °Vegetables °Creamed or fried vegetables. Vegetables in a cheese sauce. Regular canned vegetables. Regular canned tomato sauce and paste. Regular tomato and vegetable juices. °Fruits °Dried fruits. Canned fruit in light or heavy syrup. Fruit juice. °Meat and Other Protein Products °Fatty cuts of meat. Ribs, chicken wings, bacon, sausage, bologna, salami, chitterlings, fatback, hot dogs, bratwurst, and packaged luncheon meats. Salted nuts and seeds. Canned beans with salt. °Dairy °Whole or 2% milk, cream, half-and-half, and cream cheese. Whole-fat or sweetened yogurt. Full-fat   cheeses or blue cheese. Nondairy creamers and whipped toppings. Processed cheese, cheese spreads, or cheese  curds. °Condiments °Onion and garlic salt, seasoned salt, table salt, and sea salt. Canned and packaged gravies. Worcestershire sauce. Tartar sauce. Barbecue sauce. Teriyaki sauce. Soy sauce, including reduced sodium. Steak sauce. Fish sauce. Oyster sauce. Cocktail sauce. Horseradish. Ketchup and mustard. Meat flavorings and tenderizers. Bouillon cubes. Hot sauce. Tabasco sauce. Marinades. Taco seasonings. Relishes. °Fats and Oils °Butter, stick margarine, lard, shortening, ghee, and bacon fat. Coconut, palm kernel, or palm oils. Regular salad dressings. °Other °Pickles and olives. Salted popcorn and pretzels. °The items listed above may not be a complete list of foods and beverages to avoid. Contact your dietitian for more information. °WHERE CAN I FIND MORE INFORMATION? °National Heart, Lung, and Blood Institute: www.nhlbi.nih.gov/health/health-topics/topics/dash/ °Document Released: 12/19/2010 Document Revised: 05/16/2013 Document Reviewed: 11/03/2012 °Exit[employer]® Patient Information ©2015 [employer]. This information is not intended to replace advice given to you by your health care provider. Make sure you discuss any questions you have with your health care provider. ° °

## 2013-08-25 NOTE — Progress Notes (Signed)
Chief Complaint:  Chief Complaint  Patient presents with  . Medication Refill    hydrochlorothiazide    HPI: Yolanda Green is a 68 y.o. female who is here for  HTN meds  But she is afraid of blood draws HAs not had her meds for months, over 2 months She has no dizziness, no CP, SOB  Past Medical History  Diagnosis Date  . Hypertension   . PMB (postmenopausal bleeding) 2005  . Stenotic cervical os 07/08/03  . Fibroids   . H/O varicella   . History of measles, mumps, or rubella   . Yeast infection   . Trichomonas 05/08/03  . Stress 06/20/09   Past Surgical History  Procedure Laterality Date  . Inguinal hernia repair  1996  . Tubal ligation  1979  . Dilation and curettage of uterus  07/08/03  . Hysteroscopy  07/08/03  . Wisdom tooth extraction     History   Social History  . Marital Status: Married    Spouse Name: N/A    Number of Children: N/A  . Years of Education: N/A   Social History Main Topics  . Smoking status: Never Smoker   . Smokeless tobacco: Never Used  . Alcohol Use: Yes     Comment: occasional glass of wine  . Drug Use: No  . Sexual Activity: None     Comment: BTL   Other Topics Concern  . None   Social History Narrative  . None   Family History  Problem Relation Age of Onset  . Cancer Father     Prostate  . Diabetes Sister   . Hearing loss Son   . Cancer Paternal Aunt     3 aunts with breast cancer   No Known Allergies Prior to Admission medications   Medication Sig Start Date End Date Taking? Authorizing Provider  amLODipine (NORVASC) 5 MG tablet Take 1 tablet (5 mg total) by mouth daily. BP goals is less than 150/90. Follow-up in 2 weeks 08/25/13   Thao P Le, DO  HYDROcodone-acetaminophen (NORCO/VICODIN) 5-325 MG per tablet Take 1 tablet by mouth every 6 (six) hours as needed. 08/19/13   Orma Flaming, MD     ROS: The patient denies fevers, chills, night sweats, unintentional weight loss, chest pain, palpitations, wheezing,  dyspnea on exertion, nausea, vomiting, abdominal pain, dysuria, hematuria, melena, numbness, weakness, or tingling.   All other systems have been reviewed and were otherwise negative with the exception of those mentioned in the HPI and as above.    PHYSICAL EXAM: Filed Vitals:   08/25/13 1819  BP: 160/90  Pulse: 99  Temp: 98 F (36.7 C)  Resp: 16   Filed Vitals:   08/25/13 1819  Height: 5' 6.5" (1.689 m)  Weight: 179 lb (81.194 kg)   Body mass index is 28.46 kg/(m^2).  General: Alert, no acute distress HEENT:  Normocephalic, atraumatic, oropharynx patent. EOMI, PERRLA, fundo exam normal Cardiovascular:  Regular rate and rhythm, no rubs murmurs or gallops.  No Carotid bruits, radial pulse intact. No pedal edema.  Respiratory: Clear to auscultation bilaterally.  No wheezes, rales, or rhonchi.  No cyanosis, no use of accessory musculature GI: No organomegaly, abdomen is soft and non-tender, positive bowel sounds.  No masses. Skin: No rashes. Neurologic: Facial musculature symmetric. Psychiatric: Patient is appropriate throughout our interaction. Lymphatic: No cervical lymphadenopathy Musculoskeletal: Gait intact.   LABS: Results for orders placed in visit on 63/81/77  BASIC METABOLIC PANEL  Result Value Ref Range   Sodium 137  135 - 145 mEq/L   Potassium 4.4  3.5 - 5.3 mEq/L   Chloride 103  96 - 112 mEq/L   CO2 25  19 - 32 mEq/L   Glucose, Bld 105 (*) 70 - 99 mg/dL   BUN 16  6 - 23 mg/dL   Creat 0.95  0.50 - 1.10 mg/dL   Calcium 9.6  8.4 - 10.5 mg/dL     EKG/XRAY:   Primary read interpreted by Dr. Marin Comment at [facility]   ASSESSMENT/PLAN: Encounter Diagnosis  Name Primary?  . Essential hypertension Yes   Norvasc 5 mg daily She is afraid of blood draws and does not want to have to do it regular Currently asymptomatic : no cp, sob , palpitations, dizziness, HA, vision changes.  F/u in 2 weeks with BP logs, BP goal is less than 150/90   Gross sideeffects, risk and  benefits, and alternatives of medications d/w patient. Patient is aware that all medications have potential sideeffects and we are unable to predict every sideeffect or drug-drug interaction that may occur.  LE, [place], DO 08/25/2013 7:17 PM

## 2013-09-05 DIAGNOSIS — M25519 Pain in unspecified shoulder: Secondary | ICD-10-CM | POA: Diagnosis not present

## 2013-09-12 DIAGNOSIS — M25519 Pain in unspecified shoulder: Secondary | ICD-10-CM | POA: Diagnosis not present

## 2013-09-13 DIAGNOSIS — M25519 Pain in unspecified shoulder: Secondary | ICD-10-CM | POA: Diagnosis not present

## 2013-09-19 ENCOUNTER — Other Ambulatory Visit: Payer: Self-pay | Admitting: Family Medicine

## 2013-10-03 DIAGNOSIS — M25519 Pain in unspecified shoulder: Secondary | ICD-10-CM | POA: Diagnosis not present

## 2013-10-04 DIAGNOSIS — M25519 Pain in unspecified shoulder: Secondary | ICD-10-CM | POA: Diagnosis not present

## 2013-10-11 DIAGNOSIS — M25519 Pain in unspecified shoulder: Secondary | ICD-10-CM | POA: Diagnosis not present

## 2013-10-20 DIAGNOSIS — S40012D Contusion of left shoulder, subsequent encounter: Secondary | ICD-10-CM | POA: Diagnosis not present

## 2013-10-20 DIAGNOSIS — M25512 Pain in left shoulder: Secondary | ICD-10-CM | POA: Diagnosis not present

## 2013-10-20 DIAGNOSIS — M25612 Stiffness of left shoulder, not elsewhere classified: Secondary | ICD-10-CM | POA: Diagnosis not present

## 2013-10-21 DIAGNOSIS — M25512 Pain in left shoulder: Secondary | ICD-10-CM | POA: Diagnosis not present

## 2013-10-21 DIAGNOSIS — S40012D Contusion of left shoulder, subsequent encounter: Secondary | ICD-10-CM | POA: Diagnosis not present

## 2013-10-21 DIAGNOSIS — M25612 Stiffness of left shoulder, not elsewhere classified: Secondary | ICD-10-CM | POA: Diagnosis not present

## 2013-10-25 DIAGNOSIS — S40012D Contusion of left shoulder, subsequent encounter: Secondary | ICD-10-CM | POA: Diagnosis not present

## 2013-10-25 DIAGNOSIS — M25512 Pain in left shoulder: Secondary | ICD-10-CM | POA: Diagnosis not present

## 2013-10-25 DIAGNOSIS — M25612 Stiffness of left shoulder, not elsewhere classified: Secondary | ICD-10-CM | POA: Diagnosis not present

## 2013-10-26 DIAGNOSIS — M25512 Pain in left shoulder: Secondary | ICD-10-CM | POA: Diagnosis not present

## 2013-10-26 DIAGNOSIS — S40012D Contusion of left shoulder, subsequent encounter: Secondary | ICD-10-CM | POA: Diagnosis not present

## 2013-10-26 DIAGNOSIS — M25612 Stiffness of left shoulder, not elsewhere classified: Secondary | ICD-10-CM | POA: Diagnosis not present

## 2013-10-31 DIAGNOSIS — M25512 Pain in left shoulder: Secondary | ICD-10-CM | POA: Diagnosis not present

## 2013-10-31 DIAGNOSIS — M25612 Stiffness of left shoulder, not elsewhere classified: Secondary | ICD-10-CM | POA: Diagnosis not present

## 2013-10-31 DIAGNOSIS — S40012D Contusion of left shoulder, subsequent encounter: Secondary | ICD-10-CM | POA: Diagnosis not present

## 2013-11-01 ENCOUNTER — Other Ambulatory Visit: Payer: Self-pay | Admitting: Family Medicine

## 2013-11-02 DIAGNOSIS — M25512 Pain in left shoulder: Secondary | ICD-10-CM | POA: Diagnosis not present

## 2013-11-02 DIAGNOSIS — M25612 Stiffness of left shoulder, not elsewhere classified: Secondary | ICD-10-CM | POA: Diagnosis not present

## 2013-11-02 DIAGNOSIS — S40012D Contusion of left shoulder, subsequent encounter: Secondary | ICD-10-CM | POA: Diagnosis not present

## 2013-11-07 DIAGNOSIS — M25612 Stiffness of left shoulder, not elsewhere classified: Secondary | ICD-10-CM | POA: Diagnosis not present

## 2013-11-07 DIAGNOSIS — M25512 Pain in left shoulder: Secondary | ICD-10-CM | POA: Diagnosis not present

## 2013-11-07 DIAGNOSIS — S40012D Contusion of left shoulder, subsequent encounter: Secondary | ICD-10-CM | POA: Diagnosis not present

## 2013-11-10 DIAGNOSIS — M25512 Pain in left shoulder: Secondary | ICD-10-CM | POA: Diagnosis not present

## 2013-11-10 DIAGNOSIS — S40012D Contusion of left shoulder, subsequent encounter: Secondary | ICD-10-CM | POA: Diagnosis not present

## 2013-11-10 DIAGNOSIS — M25612 Stiffness of left shoulder, not elsewhere classified: Secondary | ICD-10-CM | POA: Diagnosis not present

## 2013-11-16 DIAGNOSIS — S40012D Contusion of left shoulder, subsequent encounter: Secondary | ICD-10-CM | POA: Diagnosis not present

## 2013-11-16 DIAGNOSIS — M25512 Pain in left shoulder: Secondary | ICD-10-CM | POA: Diagnosis not present

## 2013-11-16 DIAGNOSIS — M25612 Stiffness of left shoulder, not elsewhere classified: Secondary | ICD-10-CM | POA: Diagnosis not present

## 2013-11-18 DIAGNOSIS — M25512 Pain in left shoulder: Secondary | ICD-10-CM | POA: Diagnosis not present

## 2013-11-18 DIAGNOSIS — M25612 Stiffness of left shoulder, not elsewhere classified: Secondary | ICD-10-CM | POA: Diagnosis not present

## 2013-11-18 DIAGNOSIS — S40012D Contusion of left shoulder, subsequent encounter: Secondary | ICD-10-CM | POA: Diagnosis not present

## 2013-11-19 ENCOUNTER — Other Ambulatory Visit: Payer: Self-pay | Admitting: Family Medicine

## 2013-11-21 NOTE — Telephone Encounter (Signed)
LMOM for CB. Needs f/up plan. Pt was to have RTC for re-check 2 wks after Aug OV w/ BP log. What is status of BP?

## 2013-11-23 DIAGNOSIS — M25612 Stiffness of left shoulder, not elsewhere classified: Secondary | ICD-10-CM | POA: Diagnosis not present

## 2013-11-23 DIAGNOSIS — M25512 Pain in left shoulder: Secondary | ICD-10-CM | POA: Diagnosis not present

## 2013-11-23 DIAGNOSIS — S40012D Contusion of left shoulder, subsequent encounter: Secondary | ICD-10-CM | POA: Diagnosis not present

## 2013-11-24 DIAGNOSIS — M25512 Pain in left shoulder: Secondary | ICD-10-CM | POA: Diagnosis not present

## 2013-11-24 DIAGNOSIS — M25612 Stiffness of left shoulder, not elsewhere classified: Secondary | ICD-10-CM | POA: Diagnosis not present

## 2013-11-24 DIAGNOSIS — S40012D Contusion of left shoulder, subsequent encounter: Secondary | ICD-10-CM | POA: Diagnosis not present

## 2013-11-24 NOTE — Telephone Encounter (Signed)
Pharmacist called and I advised that pt needs to call us back w/f/up plan, and we can try to send her in some to give her a few days to get in. He stated that he will let the pt know.

## 2013-11-26 NOTE — Telephone Encounter (Signed)
Needs office visit.

## 2013-12-19 ENCOUNTER — Other Ambulatory Visit: Payer: Self-pay | Admitting: Family Medicine

## 2013-12-20 NOTE — Telephone Encounter (Signed)
I called pharm and asked them to verbally tell pt also when she p/up RF that she needs OV for more since we don't have a good ph # for her.

## 2013-12-20 NOTE — Telephone Encounter (Signed)
Dr Marin Comment, I am not able to reach pt to advise her she needs OV, her H # has been changed w/no new # listed and she did not give permission to call her at work. You had wanted her to RTC in 2 weeks when seen in Aug. We have given her 3 notices on her last 3 RFs, while reducing # given each time, but pt has not come in or set up appt. Do you want to deny w/ note to get in touch w/us?

## 2014-01-13 HISTORY — PX: COLONOSCOPY: SHX174

## 2014-03-03 ENCOUNTER — Ambulatory Visit (INDEPENDENT_AMBULATORY_CARE_PROVIDER_SITE_OTHER): Payer: Medicare Other | Admitting: Emergency Medicine

## 2014-03-03 VITALS — BP 140/88 | HR 82 | Temp 97.9°F | Resp 18 | Ht 65.5 in | Wt 175.0 lb

## 2014-03-03 DIAGNOSIS — Z760 Encounter for issue of repeat prescription: Secondary | ICD-10-CM | POA: Diagnosis not present

## 2014-03-03 DIAGNOSIS — Z131 Encounter for screening for diabetes mellitus: Secondary | ICD-10-CM

## 2014-03-03 DIAGNOSIS — I10 Essential (primary) hypertension: Secondary | ICD-10-CM

## 2014-03-03 LAB — POCT CBC
Granulocyte percent: 51.1 %G (ref 37–80)
HCT, POC: 45.9 % (ref 37.7–47.9)
Hemoglobin: 15.3 g/dL (ref 12.2–16.2)
Lymph, poc: 3.2 (ref 0.6–3.4)
MCH, POC: 32.4 pg — AB (ref 27–31.2)
MCHC: 33.3 g/dL (ref 31.8–35.4)
MCV: 97.4 fL — AB (ref 80–97)
MID (cbc): 0.5 (ref 0–0.9)
MPV: 6.9 fL (ref 0–99.8)
POC Granulocyte: 3.9 (ref 2–6.9)
POC LYMPH PERCENT: 41.9 %L (ref 10–50)
POC MID %: 7 %M (ref 0–12)
Platelet Count, POC: 243 10*3/uL (ref 142–424)
RBC: 4.71 M/uL (ref 4.04–5.48)
RDW, POC: 14.1 %
WBC: 7.7 10*3/uL (ref 4.6–10.2)

## 2014-03-03 LAB — COMPREHENSIVE METABOLIC PANEL
ALT: 20 U/L (ref 0–35)
AST: 19 U/L (ref 0–37)
Albumin: 4 g/dL (ref 3.5–5.2)
Alkaline Phosphatase: 101 U/L (ref 39–117)
BUN: 18 mg/dL (ref 6–23)
CO2: 25 mEq/L (ref 19–32)
Calcium: 9.4 mg/dL (ref 8.4–10.5)
Chloride: 105 mEq/L (ref 96–112)
Creat: 0.85 mg/dL (ref 0.50–1.10)
Glucose, Bld: 85 mg/dL (ref 70–99)
Potassium: 4.6 mEq/L (ref 3.5–5.3)
Sodium: 140 mEq/L (ref 135–145)
Total Bilirubin: 0.6 mg/dL (ref 0.2–1.2)
Total Protein: 7 g/dL (ref 6.0–8.3)

## 2014-03-03 LAB — LIPID PANEL
Cholesterol: 252 mg/dL — ABNORMAL HIGH (ref 0–200)
HDL: 56 mg/dL (ref >39)
LDL Cholesterol: 174 mg/dL — ABNORMAL HIGH (ref 0–99)
Total CHOL/HDL Ratio: 4.5 Ratio
Triglycerides: 112 mg/dL (ref <150)
VLDL: 22 mg/dL (ref 0–40)

## 2014-03-03 LAB — GLUCOSE, POCT (MANUAL RESULT ENTRY): POC Glucose: 81 mg/dl (ref 70–99)

## 2014-03-03 MED ORDER — AMLODIPINE BESYLATE 5 MG PO TABS: 5.0000 mg | ORAL_TABLET | Freq: Every day | ORAL | Status: DC

## 2014-03-03 NOTE — Progress Notes (Signed)
This chart was scribed for Nena Jordan, MD by Einar Pheasant, ED Scribe. This patient was seen in room 3 and the patient's care was started at 10:53 AM.  Subjective:    Patient ID: Yolanda Green, female    DOB: May 27, 1945, 69 y.o.   MRN: [id number]  Chief Complaint  Patient presents with   rx refills    norvasc    HPI Yolanda Green is a 69 y.o. female with a PMhx of HTN. She presents to the office today for a refill on her Norvasc. Pt states that she is generally feeling well. Pt denies fever, neck pain, sore throat, visual disturbance, CP, cough, SOB, abdominal pain, nausea, emesis, diarrhea, urinary symptoms, back pain, HA, weakness, numbness and rash as associated symptoms.   Pt declined the prevnar13  Vaccine.   Patient Active Problem List   Diagnosis Date Noted   HYPERTENSION 12/23/2006   CHICKENPOX, HX OF 12/23/2006   Past Medical History  Diagnosis Date   Hypertension    PMB (postmenopausal bleeding) 2005   Stenotic cervical os 07/08/03   Fibroids    H/O varicella    History of measles, mumps, or rubella    Yeast infection    Trichomonas 05/08/03   Stress 06/20/09   Past Surgical History  Procedure Laterality Date   Inguinal hernia repair  1996   Tubal ligation  1979   Dilation and curettage of uterus  07/08/03   Hysteroscopy  07/08/03   Wisdom tooth extraction     No Known Allergies Prior to Admission medications   Medication Sig Start Date End Date Taking? Authorizing Provider  amLODipine (NORVASC) 5 MG tablet Take 1 tablet (5 mg total) by mouth daily. NO MORE REFILLS WITHOUT OFFICE VISIT 3RD AND FINAL NOTICE 11/26/13  Yes Chelle S Jeffery, PA-C  HYDROcodone-acetaminophen (NORCO/VICODIN) 5-325 MG per tablet Take 1 tablet by mouth every 6 (six) hours as needed. Patient not taking: Reported on 03/03/2014 08/19/13   Orma Flaming, MD   History   Social History   Marital Status: Married    Spouse Name: N/A   Number of Children: N/A    Years of Education: N/A   Occupational History   Not on file.   Social History Main Topics   Smoking status: Never Smoker    Smokeless tobacco: Never Used   Alcohol Use: Yes     Comment: occasional glass of wine   Drug Use: No   Sexual Activity: Not on file     Comment: BTL   Other Topics Concern   Not on file   Social History Narrative     Review of Systems  Constitutional: Negative for fatigue and unexpected weight change.  Respiratory: Negative for chest tightness and shortness of breath.   Cardiovascular: Negative for chest pain, palpitations and leg swelling.  Gastrointestinal: Negative for abdominal pain and blood in stool.  Neurological: Negative for dizziness, syncope, light-headedness and headaches.   Objective:   Physical Exam  Constitutional: She appears well-developed and well-nourished. No distress.  HENT:  Head: Normocephalic and atraumatic.  Eyes: Conjunctivae are normal. Right eye exhibits no discharge. Left eye exhibits no discharge.  Neck: Neck supple.  Cardiovascular: Normal rate, regular rhythm and normal heart sounds.  Exam reveals no gallop and no friction rub.   No murmur heard. BP: 144/100  Pulmonary/Chest: Effort normal and breath sounds normal. No respiratory distress.  Abdominal: Soft. She exhibits no distension. There is no tenderness.  Musculoskeletal: She exhibits no edema  or tenderness.  Neurological: She is alert.  Skin: Skin is warm and dry.  Psychiatric: She has a normal mood and affect. Her behavior is normal. Thought content normal.  Nursing note and vitals reviewed.  Filed Vitals:   03/03/14 0959  BP: 140/88  Pulse: 82  Temp: 97.9 F (36.6 C)  TempSrc: Oral  Resp: 18  Height: 5' 5.5" (1.664 m)  Weight: 175 lb (79.379 kg)  SpO2: 95%   Results for orders placed or performed in visit on 03/03/14  POCT CBC  Result Value Ref Range   WBC 7.7 4.6 - 10.2 K/uL   Lymph, poc 3.2 0.6 - 3.4   POC LYMPH PERCENT 41.9 10 - 50  %L   MID (cbc) 0.5 0 - 0.9   POC MID % 7.0 0 - 12 %M   POC Granulocyte 3.9 2 - 6.9   Granulocyte percent 51.1 37 - 80 %G   RBC 4.71 4.04 - 5.48 M/uL   Hemoglobin 15.3 12.2 - 16.2 g/dL   HCT, POC 45.9 37.7 - 47.9 %   MCV 97.4 (A) 80 - 97 fL   MCH, POC 32.4 (A) 27 - 31.2 pg   MCHC 33.3 31.8 - 35.4 g/dL   RDW, POC 14.1 %   Platelet Count, POC 243 142 - 424 K/uL   MPV 6.9 0 - 99.8 fL  POCT glucose (manual entry)  Result Value Ref Range   POC Glucose 81 70 - 99 mg/dl         Assessment & Plan:

## 2014-03-03 NOTE — Progress Notes (Signed)
This chart was scribed for Nena Jordan, MD by Einar Pheasant, ED Scribe. This patient was seen in room 3 and the patient's care was started at 10:53 AM.  Subjective:    Patient ID: Yolanda Green, female    DOB: 08/24/1945, 69 y.o.   MRN: [id number]  Chief Complaint  Patient presents with  . rx refills    norvasc    HPI Yolanda Green is a 69 y.o. female with a PMhx of HTN. She presents to the office today for a refill on her Norvasc. Pt states that she is generally feeling well. Pt denies fever, neck pain, sore throat, visual disturbance, CP, cough, SOB, abdominal pain, nausea, emesis, diarrhea, urinary symptoms, back pain, HA, weakness, numbness and rash as associated symptoms.   Pt declined the prevnar13  Vaccine.   Patient Active Problem List   Diagnosis Date Noted  . HYPERTENSION 12/23/2006  . CHICKENPOX, HX OF 12/23/2006   Past Medical History  Diagnosis Date  . Hypertension   . PMB (postmenopausal bleeding) 2005  . Stenotic cervical os 07/08/03  . Fibroids   . H/O varicella   . History of measles, mumps, or rubella   . Yeast infection   . Trichomonas 05/08/03  . Stress 06/20/09   Past Surgical History  Procedure Laterality Date  . Inguinal hernia repair  1996  . Tubal ligation  1979  . Dilation and curettage of uterus  07/08/03  . Hysteroscopy  07/08/03  . Wisdom tooth extraction     No Known Allergies Prior to Admission medications   Medication Sig Start Date End Date Taking? Authorizing Provider  amLODipine (NORVASC) 5 MG tablet Take 1 tablet (5 mg total) by mouth daily. NO MORE REFILLS WITHOUT OFFICE VISIT 3RD AND FINAL NOTICE 11/26/13  Yes Chelle S Jeffery, PA-C  HYDROcodone-acetaminophen (NORCO/VICODIN) 5-325 MG per tablet Take 1 tablet by mouth every 6 (six) hours as needed. Patient not taking: Reported on 03/03/2014 08/19/13   Orma Flaming, MD   History   Social History  . Marital Status: Married    Spouse Name: N/A  . Number of Children: N/A  .  Years of Education: N/A   Occupational History  . Not on file.   Social History Main Topics  . Smoking status: Never Smoker   . Smokeless tobacco: Never Used  . Alcohol Use: Yes     Comment: occasional glass of wine  . Drug Use: No  . Sexual Activity: Not on file     Comment: BTL   Other Topics Concern  . Not on file   Social History Narrative     Review of Systems  Constitutional: Negative for fatigue and unexpected weight change.  Respiratory: Negative for chest tightness and shortness of breath.   Cardiovascular: Negative for chest pain, palpitations and leg swelling.  Gastrointestinal: Negative for abdominal pain and blood in stool.  Neurological: Negative for dizziness, syncope, light-headedness and headaches.   Objective:   Physical Exam  Constitutional: She appears well-developed and well-nourished. No distress.  HENT:  Head: Normocephalic and atraumatic.  Eyes: Conjunctivae are normal. Right eye exhibits no discharge. Left eye exhibits no discharge.  Neck: Neck supple.  Cardiovascular: Normal rate, regular rhythm and normal heart sounds.  Exam reveals no gallop and no friction rub.   No murmur heard. BP: 144/100  Pulmonary/Chest: Effort normal and breath sounds normal. No respiratory distress.  Abdominal: Soft. She exhibits no distension. There is no tenderness.  Musculoskeletal: She exhibits no edema  or tenderness.  Neurological: She is alert.  Skin: Skin is warm and dry.  Psychiatric: She has a normal mood and affect. Her behavior is normal. Thought content normal.  Nursing note and vitals reviewed.  Filed Vitals:   03/03/14 0959  BP: 140/88  Pulse: 82  Temp: 97.9 F (36.6 C)  TempSrc: Oral  Resp: 18  Height: 5' 5.5" (1.664 m)  Weight: 175 lb (79.379 kg)  SpO2: 95%   Results for orders placed or performed in visit on 03/03/14  POCT CBC  Result Value Ref Range   WBC 7.7 4.6 - 10.2 K/uL   Lymph, poc 3.2 0.6 - 3.4   POC LYMPH PERCENT 41.9 10 - 50  %L   MID (cbc) 0.5 0 - 0.9   POC MID % 7.0 0 - 12 %M   POC Granulocyte 3.9 2 - 6.9   Granulocyte percent 51.1 37 - 80 %G   RBC 4.71 4.04 - 5.48 M/uL   Hemoglobin 15.3 12.2 - 16.2 g/dL   HCT, POC 45.9 37.7 - 47.9 %   MCV 97.4 (A) 80 - 97 fL   MCH, POC 32.4 (A) 27 - 31.2 pg   MCHC 33.3 31.8 - 35.4 g/dL   RDW, POC 14.1 %   Platelet Count, POC 243 142 - 424 K/uL   MPV 6.9 0 - 99.8 fL  POCT glucose (manual entry)  Result Value Ref Range   POC Glucose 81 70 - 99 mg/dl         Assessment & Plan:  I did refill the Norvasc routine labs were done.I personally performed the services described in this documentation, which was scribed in my presence. The recorded information has been reviewed and is accurate.

## 2014-03-07 MED ORDER — ATORVASTATIN CALCIUM 40 MG PO TABS: 40.0000 mg | ORAL_TABLET | Freq: Every day | ORAL | Status: DC

## 2014-03-07 NOTE — Addendum Note (Signed)
Addended by: Constance Goltz on: 03/07/2014 02:17 PM   Modules accepted: Orders

## 2014-06-14 DIAGNOSIS — Z1231 Encounter for screening mammogram for malignant neoplasm of breast: Secondary | ICD-10-CM | POA: Diagnosis not present

## 2014-06-15 ENCOUNTER — Telehealth: Payer: Self-pay | Admitting: Family Medicine

## 2014-06-15 NOTE — Telephone Encounter (Signed)
LVM concerning mammogram. 

## 2014-06-16 NOTE — Telephone Encounter (Signed)
I updated chart.

## 2014-06-16 NOTE — Telephone Encounter (Signed)
Pt call and said her last mammpgram 06/14/14 [place]

## 2014-11-30 DIAGNOSIS — K5730 Diverticulosis of large intestine without perforation or abscess without bleeding: Secondary | ICD-10-CM | POA: Diagnosis not present

## 2014-11-30 DIAGNOSIS — Z1211 Encounter for screening for malignant neoplasm of colon: Secondary | ICD-10-CM | POA: Diagnosis not present

## 2014-12-15 DIAGNOSIS — D3615 Benign neoplasm of peripheral nerves and autonomic nervous system of abdomen: Secondary | ICD-10-CM | POA: Diagnosis not present

## 2014-12-15 DIAGNOSIS — K635 Polyp of colon: Secondary | ICD-10-CM | POA: Diagnosis not present

## 2014-12-15 DIAGNOSIS — K5730 Diverticulosis of large intestine without perforation or abscess without bleeding: Secondary | ICD-10-CM | POA: Diagnosis not present

## 2014-12-15 DIAGNOSIS — Z1211 Encounter for screening for malignant neoplasm of colon: Secondary | ICD-10-CM | POA: Diagnosis not present

## 2014-12-15 DIAGNOSIS — D122 Benign neoplasm of ascending colon: Secondary | ICD-10-CM | POA: Diagnosis not present

## 2015-03-29 ENCOUNTER — Other Ambulatory Visit: Payer: Self-pay | Admitting: Emergency Medicine

## 2015-04-05 ENCOUNTER — Ambulatory Visit (INDEPENDENT_AMBULATORY_CARE_PROVIDER_SITE_OTHER): Payer: Medicare Other | Admitting: Family Medicine

## 2015-04-05 VITALS — BP 158/98 | HR 97 | Temp 98.0°F | Resp 16 | Ht 66.0 in | Wt 174.0 lb

## 2015-04-05 DIAGNOSIS — I10 Essential (primary) hypertension: Secondary | ICD-10-CM | POA: Diagnosis not present

## 2015-04-05 DIAGNOSIS — R519 Headache, unspecified: Secondary | ICD-10-CM

## 2015-04-05 DIAGNOSIS — R51 Headache: Secondary | ICD-10-CM | POA: Diagnosis not present

## 2015-04-05 DIAGNOSIS — J069 Acute upper respiratory infection, unspecified: Secondary | ICD-10-CM

## 2015-04-05 DIAGNOSIS — E785 Hyperlipidemia, unspecified: Secondary | ICD-10-CM

## 2015-04-05 LAB — COMPLETE METABOLIC PANEL WITH GFR
ALT: 17 U/L (ref 6–29)
AST: 18 U/L (ref 10–35)
Albumin: 3.7 g/dL (ref 3.6–5.1)
Alkaline Phosphatase: 83 U/L (ref 33–130)
BUN: 10 mg/dL (ref 7–25)
CO2: 25 mmol/L (ref 20–31)
Calcium: 9.1 mg/dL (ref 8.6–10.4)
Chloride: 103 mmol/L (ref 98–110)
Creat: 0.87 mg/dL (ref 0.50–0.99)
GFR, Est African American: 80 mL/min (ref >=60)
GFR, Est Non African American: 69 mL/min (ref >=60)
Glucose, Bld: 94 mg/dL (ref 65–99)
Potassium: 4.5 mmol/L (ref 3.5–5.3)
Sodium: 137 mmol/L (ref 135–146)
Total Bilirubin: 0.6 mg/dL (ref 0.2–1.2)
Total Protein: 7 g/dL (ref 6.1–8.1)

## 2015-04-05 LAB — LIPID PANEL
Cholesterol: 191 mg/dL (ref 125–200)
HDL: 53 mg/dL (ref >=46)
LDL Cholesterol: 124 mg/dL (ref <130)
Total CHOL/HDL Ratio: 3.6 Ratio (ref <=5.0)
Triglycerides: 68 mg/dL (ref <150)
VLDL: 14 mg/dL (ref <30)

## 2015-04-05 MED ORDER — AMLODIPINE BESYLATE 5 MG PO TABS: 5.0000 mg | ORAL_TABLET | Freq: Every day | ORAL | Status: DC

## 2015-04-05 MED ORDER — ACETAMINOPHEN 325 MG PO TABS
1000.0000 mg | ORAL_TABLET | Freq: Once | ORAL | Status: AC
  Administered 2015-04-05: 975 mg via ORAL

## 2015-04-05 MED ORDER — ATORVASTATIN CALCIUM 40 MG PO TABS: 40.0000 mg | ORAL_TABLET | Freq: Every day | ORAL | Status: DC

## 2015-04-05 NOTE — Progress Notes (Signed)
Subjective:  By signing my name below, I, Moises Blood, attest that this documentation has been prepared under the direction and in the presence of Yolanda Ray, MD. Electronically Signed: Moises Blood, [place]. 04/05/2015 , 12:04 PM .  Patient was seen in Room 3 .   Patient ID: ZYRAH MORATH, female    DOB: 1945/06/25, 70 y.o.   MRN: [id number] Chief Complaint  Patient presents with  . Headache    X 3 DAYS  . Medication Refill    Amlodipine 5mg     HPI TIJANA TRUDGEON is a 70 y.o. female Here for complaint of headache for the past 3 days.  Has h/o HTN; last seen 03/03/14 by Dr. Everlene Farrier. She was feeling well at that time; BP in office was 140/88. Norvasc was refilled at that time with 30,11 refills. She had a CMP done, which was normal. She hasn't eaten since last night.   HLD Lab Results  Component Value Date   CHOL 252* 03/03/2014   HDL 56 03/03/2014   LDLCALC 174* 03/03/2014   TRIG 112 03/03/2014   CHOLHDL 4.5 03/03/2014   Recommended starting lipitor 40mg  qd. Planned to repeat lipid panel in 3 months.  She stopped taking lipitor after reading the side effects. She denies having any side effects.   HTN She also requests having medication refill of her amlodipine 5mg . She last took her medication 3 days ago.   Illness She also started getting nasal congestion with sinus pressure 3 days ago. She denies fever, slurred speech, vision changes, or chest pain. She denies any sick contact. She has a very rare cough.   Patient Active Problem List   Diagnosis Date Noted  . HYPERTENSION 12/23/2006  . CHICKENPOX, HX OF 12/23/2006   Past Medical History  Diagnosis Date  . Hypertension   . PMB (postmenopausal bleeding) 2005  . Stenotic cervical os 07/08/03  . Fibroids   . H/O varicella   . History of measles, mumps, or rubella   . Yeast infection   . Trichomonas 05/08/03  . Stress 06/20/09   Past Surgical History  Procedure Laterality Date  . Inguinal hernia repair   1996  . Tubal ligation  1979  . Dilation and curettage of uterus  07/08/03  . Hysteroscopy  07/08/03  . Wisdom tooth extraction     No Known Allergies Prior to Admission medications   Medication Sig Start Date End Date Taking? Authorizing Provider  amLODipine (NORVASC) 5 MG tablet Take 1 tablet (5 mg total) by mouth daily. NO MORE REFILLS WITHOUT OFFICE VISIT 3RD AND FINAL NOTICE 03/03/14  Yes Darlyne Russian, MD  atorvastatin (LIPITOR) 40 MG tablet Take 1 tablet (40 mg total) by mouth daily. 03/07/14  Yes Darlyne Russian, MD   Social History   Social History  . Marital Status: Married    Spouse Name: N/A  . Number of Children: N/A  . Years of Education: N/A   Occupational History  . Not on file.   Social History Main Topics  . Smoking status: Never Smoker   . Smokeless tobacco: Never Used  . Alcohol Use: Yes     Comment: occasional glass of wine  . Drug Use: No  . Sexual Activity: Not on file     Comment: BTL   Other Topics Concern  . Not on file   Social History Narrative   Review of Systems  Constitutional: Negative for fever, fatigue and unexpected weight change.  HENT: Positive for congestion and  sinus pressure.   Respiratory: Positive for cough. Negative for chest tightness and shortness of breath.   Cardiovascular: Negative for chest pain, palpitations and leg swelling.  Gastrointestinal: Negative for abdominal pain and blood in stool.  Neurological: Positive for headaches. Negative for dizziness, syncope, speech difficulty and light-headedness.       Objective:   Physical Exam  Constitutional: She is oriented to person, place, and time. She appears well-developed and well-nourished. No distress.  HENT:  Head: Normocephalic and atraumatic.  Right Ear: Hearing, tympanic membrane, external ear and ear canal normal.  Left Ear: Hearing, tympanic membrane, external ear and ear canal normal.  Nose: Nose normal.  Mouth/Throat: Oropharynx is clear and moist. No  oropharyngeal exudate.  Minimal cerumen in both canals, visualized tm was normal  Eyes: Conjunctivae and EOM are normal. Pupils are equal, round, and reactive to light. Right eye exhibits no nystagmus. Left eye exhibits no nystagmus.  Neck: Carotid bruit is not present.  Cardiovascular: Normal rate, regular rhythm, normal heart sounds and intact distal pulses.   No murmur heard. Pulmonary/Chest: Effort normal and breath sounds normal. No respiratory distress. She has no wheezes. She has no rhonchi.  Abdominal: Soft. She exhibits no pulsatile midline mass. There is no tenderness.  Neurological: She is alert and oriented to person, place, and time. She displays a negative Romberg sign.  Normal heel-toe gait, negative pronator drift, normal finger-nose test  Skin: Skin is warm and dry. No rash noted.  Psychiatric: She has a normal mood and affect. Her behavior is normal.  Vitals reviewed.  Filed Vitals:   04/05/15 1029 04/05/15 1057  BP: 172/102 158/98  Pulse: 97   Temp: 98 F (36.7 C)   TempSrc: Oral   Resp: 16   Height: 5\' 6"  (1.676 m)   Weight: 174 lb (78.926 kg)   SpO2: 95%       Assessment & Plan:   MAYLANIE SCHLUTER is a 69 y.o. female Headache, unspecified headache type - Plan: acetaminophen (TYLENOL) tablet 975 mg  - Likely combination of untreated hypertension, and recent upper respiratory infection. Nonfocal neurologic exam, no red flags on history or exam. Tylenol given in office. Restart antihypertensive, symptomatic care for upper respiratory infection, ER/RTC precautions if headache persists or any new/worsening symptoms. Understanding expressed.  Essential hypertension - Plan: amLODipine (NORVASC) 5 MG tablet, COMPLETE METABOLIC PANEL WITH GFR  -Uncontrolled as medication nonadherence, out of medicines past 3 days. Has not been seen since over 1 year ago. Importance of regular follow-up discussed. Restart amlodipine at 5 mg daily, monitor outside blood pressures, and if  remaining over 140/90, return for recheck. ER precautions.  Hyperlipidemia - Plan: atorvastatin (LIPITOR) 40 MG tablet, Lipid panel  -Medication nonadherence, only tried medication temporarily, worried about side effects, but she was not having any side effects. Discussed risks of cardiovascular disease with hypertension, hyperlipidemia, and age.    - restart Lipitor 40 mg daily, check CMP, lipids as a baseline today, but follow up in 3 months for repeat lipids and CMP. Understanding expressed.  Acute upper respiratory infection  -Symptomatic care, RTC precautions.  Meds ordered this encounter  Medications  . acetaminophen (TYLENOL) tablet 975 mg    Sig:   . atorvastatin (LIPITOR) 40 MG tablet    Sig: Take 1 tablet (40 mg total) by mouth daily.    Dispense:  30 tablet    Refill:  3  . amLODipine (NORVASC) 5 MG tablet    Sig: Take 1 tablet (5  mg total) by mouth daily.    Dispense:  90 tablet    Refill:  1   Patient Instructions       IF you received an x-Green today, you will receive an invoice from [facility]. Please contact [facility] at [phone] with questions or concerns regarding your invoice.   IF you received labwork today, you will receive an invoice from [employer]. Please contact [facility] at [phone] with questions or concerns regarding your invoice.   Our billing staff will not be able to assist you with questions regarding bills from these companies.  You will be contacted with the lab results as soon as they are available. The fastest way to get your results is to activate your My Chart account. Instructions are located on the last page of this paperwork. If you have not heard from [place] regarding the results in 2 weeks, please contact this office.    As we discussed, it is very important that we recheck it when you're on medication to make sure you're on the correct doses of medication. Can restart her amlodipine a  previous dose, but check your blood pressures at home once you have restarted medication. If those blood pressures remain over 140/90 on medication, return for recheck and discussion of stronger medication.  If your headache does not improve with treatment of blood pressure, or any worsening symptoms as we discussed, recheck here or the emergency room.  Restart Lipitor once per day for your cholesterol, and follow-up in the next 3 months to recheck the cholesterol test and liver tests on that medication.  Return to the clinic or go to the nearest emergency room if any of your symptoms worsen or new symptoms occur.  Upper Respiratory Infection, Adult Most upper respiratory infections (URIs) are a viral infection of the air passages leading to the lungs. A URI affects the nose, throat, and upper air passages. The most common type of URI is nasopharyngitis and is typically referred to as "the common cold." URIs run their course and usually go away on their own. Most of the time, a URI does not require medical attention, but sometimes a bacterial infection in the upper airways can follow a viral infection. This is called a secondary infection. Sinus and middle ear infections are common types of secondary upper respiratory infections. Bacterial pneumonia can also complicate a URI. A URI can worsen asthma and chronic obstructive pulmonary disease (COPD). Sometimes, these complications can require emergency medical care and may be life threatening.  CAUSES Almost all URIs are caused by viruses. A virus is a type of germ and can spread from one person to another.  RISKS FACTORS You may be at risk for a URI if:   You smoke.   You have chronic heart or lung disease.  You have a weakened defense (immune) system.   You are very young or very old.   You have nasal allergies or asthma.  You work in crowded or poorly ventilated areas.  You work in health care facilities or schools. SIGNS AND  SYMPTOMS  Symptoms typically develop 2-3 days after you come in contact with a cold virus. Most viral URIs last 7-10 days. However, viral URIs from the influenza virus (flu virus) can last 14-18 days and are typically more severe. Symptoms may include:   Runny or stuffy (congested) nose.   Sneezing.   Cough.   Sore throat.   Headache.   Fatigue.   Fever.   Loss  of appetite.   Pain in your forehead, behind your eyes, and over your cheekbones (sinus pain).  Muscle aches.  DIAGNOSIS  Your health care provider may diagnose a URI by:  Physical exam.  Tests to check that your symptoms are not due to another condition such as:  Strep throat.  Sinusitis.  Pneumonia.  Asthma. TREATMENT  A URI goes away on its own with time. It cannot be cured with medicines, but medicines may be prescribed or recommended to relieve symptoms. Medicines may help:  Reduce your fever.  Reduce your cough.  Relieve nasal congestion. HOME CARE INSTRUCTIONS   Take medicines only as directed by your health care provider.   Gargle warm saltwater or take cough drops to comfort your throat as directed by your health care provider.  Use a warm mist humidifier or inhale steam from a shower to increase air moisture. This may make it easier to breathe.  Drink enough fluid to keep your urine clear or pale yellow.   Eat soups and other clear broths and maintain good nutrition.   Rest as needed.   Return to work when your temperature has returned to normal or as your health care provider advises. You may need to stay home longer to avoid infecting others. You can also use a face mask and careful hand washing to prevent spread of the virus.  Increase the usage of your inhaler if you have asthma.   Do not use any tobacco products, including cigarettes, chewing tobacco, or electronic cigarettes. If you need help quitting, ask your health care provider. PREVENTION  The best way to  protect yourself from getting a cold is to practice good hygiene.   Avoid oral or hand contact with people with cold symptoms.   Wash your hands often if contact occurs.  There is no clear evidence that vitamin C, vitamin E, echinacea, or exercise reduces the chance of developing a cold. However, it is always recommended to get plenty of rest, exercise, and practice good nutrition.  SEEK MEDICAL CARE IF:   You are getting worse rather than better.   Your symptoms are not controlled by medicine.   You have chills.  You have worsening shortness of breath.  You have brown or red mucus.  You have yellow or brown nasal discharge.  You have pain in your face, especially when you bend forward.  You have a fever.  You have swollen neck glands.  You have pain while swallowing.  You have white areas in the back of your throat. SEEK IMMEDIATE MEDICAL CARE IF:   You have severe or persistent:  Headache.  Ear pain.  Sinus pain.  Chest pain.  You have chronic lung disease and any of the following:  Wheezing.  Prolonged cough.  Coughing up blood.  A change in your usual mucus.  You have a stiff neck.  You have changes in your:  Vision.  Hearing.  Thinking.  Mood. MAKE SURE YOU:   Understand these instructions.  Will watch your condition.  Will get help right away if you are not doing well or get worse.   This information is not intended to replace advice given to you by your health care provider. Make sure you discuss any questions you have with your health care provider.   Document Released: 06/25/2000 Document Revised: 05/16/2014 Document Reviewed: 04/06/2013 Elsevier Interactive Patient Education 2016 [employer].   Hypertension Hypertension, commonly called high blood pressure, is when the force of blood pumping through  your arteries is too strong. Your arteries are the blood vessels that carry blood from your heart throughout your body. A blood  pressure reading consists of a higher number over a lower number, such as 110/72. The higher number (systolic) is the pressure inside your arteries when your heart pumps. The lower number (diastolic) is the pressure inside your arteries when your heart relaxes. Ideally you want your blood pressure below 120/80. Hypertension forces your heart to work harder to pump blood. Your arteries may become narrow or stiff. Having untreated or uncontrolled hypertension can cause heart attack, stroke, kidney disease, and other problems. RISK FACTORS Some risk factors for high blood pressure are controllable. Others are not.  Risk factors you cannot control include:   Race. You may be at higher risk if you are African American.  Age. Risk increases with age.  Gender. Men are at higher risk than women before age 43 years. After age 80, women are at higher risk than men. Risk factors you can control include:  Not getting enough exercise or physical activity.  Being overweight.  Getting too much fat, sugar, calories, or salt in your diet.  Drinking too much alcohol. SIGNS AND SYMPTOMS Hypertension does not usually cause signs or symptoms. Extremely high blood pressure (hypertensive crisis) may cause headache, anxiety, shortness of breath, and nosebleed. DIAGNOSIS To check if you have hypertension, your health care provider will measure your blood pressure while you are seated, with your arm held at the level of your heart. It should be measured at least twice using the same arm. Certain conditions can cause a difference in blood pressure between your right and left arms. A blood pressure reading that is higher than normal on one occasion does not mean that you need treatment. If it is not clear whether you have high blood pressure, you may be asked to return on a different day to have your blood pressure checked again. Or, you may be asked to monitor your blood pressure at home for 1 or more  weeks. TREATMENT Treating high blood pressure includes making lifestyle changes and possibly taking medicine. Living a healthy lifestyle can help lower high blood pressure. You may need to change some of your habits. Lifestyle changes may include:  Following the DASH diet. This diet is high in fruits, vegetables, and whole grains. It is low in salt, red meat, and added sugars.  Keep your sodium intake below 2,300 mg per day.  Getting at least 30-45 minutes of aerobic exercise at least 4 times per week.  Losing weight if necessary.  Not smoking.  Limiting alcoholic beverages.  Learning ways to reduce stress. Your health care provider may prescribe medicine if lifestyle changes are not enough to get your blood pressure under control, and if one of the following is true:  You are 52-22 years of age and your systolic blood pressure is above 140.  You are 22 years of age or older, and your systolic blood pressure is above 150.  Your diastolic blood pressure is above 90.  You have diabetes, and your systolic blood pressure is over [id number] or your diastolic blood pressure is over 90.  You have kidney disease and your blood pressure is above 140/90.  You have heart disease and your blood pressure is above 140/90. Your personal target blood pressure may vary depending on your medical conditions, your age, and other factors. HOME CARE INSTRUCTIONS  Have your blood pressure rechecked as directed by your health care provider.  Take medicines only as directed by your health care provider. Follow the directions carefully. Blood pressure medicines must be taken as prescribed. The medicine does not work as well when you skip doses. Skipping doses also puts you at risk for problems.  Do not smoke.   Monitor your blood pressure at home as directed by your health care provider. SEEK MEDICAL CARE IF:   You think you are having a reaction to medicines taken.  You have recurrent headaches or  feel dizzy.  You have swelling in your ankles.  You have trouble with your vision. SEEK IMMEDIATE MEDICAL CARE IF:  You develop a severe headache or confusion.  You have unusual weakness, numbness, or feel faint.  You have severe chest or abdominal pain.  You vomit repeatedly.  You have trouble breathing. MAKE SURE YOU:   Understand these instructions.  Will watch your condition.  Will get help right away if you are not doing well or get worse.   This information is not intended to replace advice given to you by your health care provider. Make sure you discuss any questions you have with your health care provider.   Document Released: 12/30/2004 Document Revised: 05/16/2014 Document Reviewed: 10/22/2012 Elsevier Interactive Patient Education [employer].     I personally performed the services described in this documentation, which was scribed in my presence. The recorded information has been reviewed and considered, and addended by me as needed.

## 2015-04-05 NOTE — Patient Instructions (Addendum)
IF you received an x-ray today, you will receive an invoice from [facility]. Please contact [facility] at [phone] with questions or concerns regarding your invoice.   IF you received labwork today, you will receive an invoice from [employer]. Please contact [facility] at [phone] with questions or concerns regarding your invoice.   Our billing staff will not be able to assist you with questions regarding bills from these companies.  You will be contacted with the lab results as soon as they are available. The fastest way to get your results is to activate your My Chart account. Instructions are located on the last page of this paperwork. If you have not heard from [place] regarding the results in 2 weeks, please contact this office.    As we discussed, it is very important that we recheck it when you're on medication to make sure you're on the correct doses of medication. Can restart her amlodipine a previous dose, but check your blood pressures at home once you have restarted medication. If those blood pressures remain over 140/90 on medication, return for recheck and discussion of stronger medication.  If your headache does not improve with treatment of blood pressure, or any worsening symptoms as we discussed, recheck here or the emergency room.  Restart Lipitor once per day for your cholesterol, and follow-up in the next 3 months to recheck the cholesterol test and liver tests on that medication.  Return to the clinic or go to the nearest emergency room if any of your symptoms worsen or new symptoms occur.  Upper Respiratory Infection, Adult Most upper respiratory infections (URIs) are a viral infection of the air passages leading to the lungs. A URI affects the nose, throat, and upper air passages. The most common type of URI is nasopharyngitis and is typically referred to as "the common cold." URIs run their course and usually go away  on their own. Most of the time, a URI does not require medical attention, but sometimes a bacterial infection in the upper airways can follow a viral infection. This is called a secondary infection. Sinus and middle ear infections are common types of secondary upper respiratory infections. Bacterial pneumonia can also complicate a URI. A URI can worsen asthma and chronic obstructive pulmonary disease (COPD). Sometimes, these complications can require emergency medical care and may be life threatening.  CAUSES Almost all URIs are caused by viruses. A virus is a type of germ and can spread from one person to another.  RISKS FACTORS You may be at risk for a URI if:   You smoke.   You have chronic heart or lung disease.  You have a weakened defense (immune) system.   You are very young or very old.   You have nasal allergies or asthma.  You work in crowded or poorly ventilated areas.  You work in health care facilities or schools. SIGNS AND SYMPTOMS  Symptoms typically develop 2-3 days after you come in contact with a cold virus. Most viral URIs last 7-10 days. However, viral URIs from the influenza virus (flu virus) can last 14-18 days and are typically more severe. Symptoms may include:   Runny or stuffy (congested) nose.   Sneezing.   Cough.   Sore throat.   Headache.   Fatigue.   Fever.   Loss of appetite.   Pain in your forehead, behind your eyes, and over your cheekbones (sinus pain).  Muscle aches.  DIAGNOSIS  Your health care  provider may diagnose a URI by:  Physical exam.  Tests to check that your symptoms are not due to another condition such as:  Strep throat.  Sinusitis.  Pneumonia.  Asthma. TREATMENT  A URI goes away on its own with time. It cannot be cured with medicines, but medicines may be prescribed or recommended to relieve symptoms. Medicines may help:  Reduce your fever.  Reduce your cough.  Relieve nasal congestion. HOME  CARE INSTRUCTIONS   Take medicines only as directed by your health care provider.   Gargle warm saltwater or take cough drops to comfort your throat as directed by your health care provider.  Use a warm mist humidifier or inhale steam from a shower to increase air moisture. This may make it easier to breathe.  Drink enough fluid to keep your urine clear or pale yellow.   Eat soups and other clear broths and maintain good nutrition.   Rest as needed.   Return to work when your temperature has returned to normal or as your health care provider advises. You may need to stay home longer to avoid infecting others. You can also use a face mask and careful hand washing to prevent spread of the virus.  Increase the usage of your inhaler if you have asthma.   Do not use any tobacco products, including cigarettes, chewing tobacco, or electronic cigarettes. If you need help quitting, ask your health care provider. PREVENTION  The best way to protect yourself from getting a cold is to practice good hygiene.   Avoid oral or hand contact with people with cold symptoms.   Wash your hands often if contact occurs.  There is no clear evidence that vitamin C, vitamin E, echinacea, or exercise reduces the chance of developing a cold. However, it is always recommended to get plenty of rest, exercise, and practice good nutrition.  SEEK MEDICAL CARE IF:   You are getting worse rather than better.   Your symptoms are not controlled by medicine.   You have chills.  You have worsening shortness of breath.  You have brown or red mucus.  You have yellow or brown nasal discharge.  You have pain in your face, especially when you bend forward.  You have a fever.  You have swollen neck glands.  You have pain while swallowing.  You have white areas in the back of your throat. SEEK IMMEDIATE MEDICAL CARE IF:   You have severe or persistent:  Headache.  Ear pain.  Sinus pain.  Chest  pain.  You have chronic lung disease and any of the following:  Wheezing.  Prolonged cough.  Coughing up blood.  A change in your usual mucus.  You have a stiff neck.  You have changes in your:  Vision.  Hearing.  Thinking.  Mood. MAKE SURE YOU:   Understand these instructions.  Will watch your condition.  Will get help right away if you are not doing well or get worse.   This information is not intended to replace advice given to you by your health care provider. Make sure you discuss any questions you have with your health care provider.   Document Released: 06/25/2000 Document Revised: 05/16/2014 Document Reviewed: 04/06/2013 Elsevier Interactive Patient Education 2016 [employer].   Hypertension Hypertension, commonly called high blood pressure, is when the force of blood pumping through your arteries is too strong. Your arteries are the blood vessels that carry blood from your heart throughout your body. A blood pressure reading consists  of a higher number over a lower number, such as 110/72. The higher number (systolic) is the pressure inside your arteries when your heart pumps. The lower number (diastolic) is the pressure inside your arteries when your heart relaxes. Ideally you want your blood pressure below 120/80. Hypertension forces your heart to work harder to pump blood. Your arteries may become narrow or stiff. Having untreated or uncontrolled hypertension can cause heart attack, stroke, kidney disease, and other problems. RISK FACTORS Some risk factors for high blood pressure are controllable. Others are not.  Risk factors you cannot control include:   Race. You may be at higher risk if you are African American.  Age. Risk increases with age.  Gender. Men are at higher risk than women before age 73 years. After age 83, women are at higher risk than men. Risk factors you can control include:  Not getting enough exercise or physical activity.  Being  overweight.  Getting too much fat, sugar, calories, or salt in your diet.  Drinking too much alcohol. SIGNS AND SYMPTOMS Hypertension does not usually cause signs or symptoms. Extremely high blood pressure (hypertensive crisis) may cause headache, anxiety, shortness of breath, and nosebleed. DIAGNOSIS To check if you have hypertension, your health care provider will measure your blood pressure while you are seated, with your arm held at the level of your heart. It should be measured at least twice using the same arm. Certain conditions can cause a difference in blood pressure between your right and left arms. A blood pressure reading that is higher than normal on one occasion does not mean that you need treatment. If it is not clear whether you have high blood pressure, you may be asked to return on a different day to have your blood pressure checked again. Or, you may be asked to monitor your blood pressure at home for 1 or more weeks. TREATMENT Treating high blood pressure includes making lifestyle changes and possibly taking medicine. Living a healthy lifestyle can help lower high blood pressure. You may need to change some of your habits. Lifestyle changes may include:  Following the DASH diet. This diet is high in fruits, vegetables, and whole grains. It is low in salt, red meat, and added sugars.  Keep your sodium intake below 2,300 mg per day.  Getting at least 30-45 minutes of aerobic exercise at least 4 times per week.  Losing weight if necessary.  Not smoking.  Limiting alcoholic beverages.  Learning ways to reduce stress. Your health care provider may prescribe medicine if lifestyle changes are not enough to get your blood pressure under control, and if one of the following is true:  You are 59-23 years of age and your systolic blood pressure is above 140.  You are 1 years of age or older, and your systolic blood pressure is above 150.  Your diastolic blood pressure is  above 90.  You have diabetes, and your systolic blood pressure is over [id number] or your diastolic blood pressure is over 90.  You have kidney disease and your blood pressure is above 140/90.  You have heart disease and your blood pressure is above 140/90. Your personal target blood pressure may vary depending on your medical conditions, your age, and other factors. HOME CARE INSTRUCTIONS  Have your blood pressure rechecked as directed by your health care provider.   Take medicines only as directed by your health care provider. Follow the directions carefully. Blood pressure medicines must be taken as prescribed. The medicine  does not work as well when you skip doses. Skipping doses also puts you at risk for problems.  Do not smoke.   Monitor your blood pressure at home as directed by your health care provider. SEEK MEDICAL CARE IF:   You think you are having a reaction to medicines taken.  You have recurrent headaches or feel dizzy.  You have swelling in your ankles.  You have trouble with your vision. SEEK IMMEDIATE MEDICAL CARE IF:  You develop a severe headache or confusion.  You have unusual weakness, numbness, or feel faint.  You have severe chest or abdominal pain.  You vomit repeatedly.  You have trouble breathing. MAKE SURE YOU:   Understand these instructions.  Will watch your condition.  Will get help right away if you are not doing well or get worse.   This information is not intended to replace advice given to you by your health care provider. Make sure you discuss any questions you have with your health care provider.   Document Released: 12/30/2004 Document Revised: 05/16/2014 Document Reviewed: 10/22/2012 Elsevier Interactive Patient Education [employer].

## 2015-04-24 ENCOUNTER — Encounter: Payer: Self-pay | Admitting: *Deleted

## 2015-07-30 DIAGNOSIS — H1132 Conjunctival hemorrhage, left eye: Secondary | ICD-10-CM | POA: Diagnosis not present

## 2015-08-10 ENCOUNTER — Other Ambulatory Visit: Payer: Self-pay | Admitting: Family Medicine

## 2015-08-10 DIAGNOSIS — E785 Hyperlipidemia, unspecified: Secondary | ICD-10-CM

## 2015-10-08 ENCOUNTER — Other Ambulatory Visit: Payer: Self-pay | Admitting: Family Medicine

## 2015-10-08 DIAGNOSIS — E785 Hyperlipidemia, unspecified: Secondary | ICD-10-CM

## 2015-12-23 ENCOUNTER — Other Ambulatory Visit: Payer: Self-pay | Admitting: Family Medicine

## 2015-12-23 DIAGNOSIS — I10 Essential (primary) hypertension: Secondary | ICD-10-CM

## 2015-12-26 DIAGNOSIS — Z124 Encounter for screening for malignant neoplasm of cervix: Secondary | ICD-10-CM | POA: Diagnosis not present

## 2015-12-26 DIAGNOSIS — Z1231 Encounter for screening mammogram for malignant neoplasm of breast: Secondary | ICD-10-CM | POA: Diagnosis not present

## 2016-01-08 ENCOUNTER — Other Ambulatory Visit: Payer: Self-pay | Admitting: Family Medicine

## 2016-01-08 DIAGNOSIS — I10 Essential (primary) hypertension: Secondary | ICD-10-CM

## 2016-02-04 ENCOUNTER — Other Ambulatory Visit: Payer: Self-pay | Admitting: Family Medicine

## 2016-02-04 DIAGNOSIS — I10 Essential (primary) hypertension: Secondary | ICD-10-CM

## 2016-02-19 ENCOUNTER — Other Ambulatory Visit: Payer: Self-pay | Admitting: Family Medicine

## 2016-02-19 DIAGNOSIS — I10 Essential (primary) hypertension: Secondary | ICD-10-CM

## 2016-03-01 ENCOUNTER — Other Ambulatory Visit: Payer: Self-pay | Admitting: Family Medicine

## 2016-03-01 DIAGNOSIS — I10 Essential (primary) hypertension: Secondary | ICD-10-CM

## 2016-04-18 ENCOUNTER — Ambulatory Visit (INDEPENDENT_AMBULATORY_CARE_PROVIDER_SITE_OTHER): Payer: Medicare Other

## 2016-04-18 ENCOUNTER — Ambulatory Visit (INDEPENDENT_AMBULATORY_CARE_PROVIDER_SITE_OTHER): Payer: Medicare Other | Admitting: Family Medicine

## 2016-04-18 VITALS — BP 136/96 | HR 85 | Temp 97.7°F | Resp 18 | Ht 66.0 in | Wt 183.0 lb

## 2016-04-18 DIAGNOSIS — M1711 Unilateral primary osteoarthritis, right knee: Secondary | ICD-10-CM

## 2016-04-18 DIAGNOSIS — Z5181 Encounter for therapeutic drug level monitoring: Secondary | ICD-10-CM

## 2016-04-18 DIAGNOSIS — D7589 Other specified diseases of blood and blood-forming organs: Secondary | ICD-10-CM | POA: Diagnosis not present

## 2016-04-18 DIAGNOSIS — I10 Essential (primary) hypertension: Secondary | ICD-10-CM

## 2016-04-18 DIAGNOSIS — M25561 Pain in right knee: Secondary | ICD-10-CM

## 2016-04-18 DIAGNOSIS — E663 Overweight: Secondary | ICD-10-CM | POA: Diagnosis not present

## 2016-04-18 DIAGNOSIS — E785 Hyperlipidemia, unspecified: Secondary | ICD-10-CM | POA: Diagnosis not present

## 2016-04-18 LAB — POCT URINALYSIS DIP (MANUAL ENTRY)
Bilirubin, UA: NEGATIVE
Blood, UA: NEGATIVE
Glucose, UA: NEGATIVE
Ketones, POC UA: NEGATIVE
Nitrite, UA: NEGATIVE
Protein Ur, POC: NEGATIVE
Spec Grav, UA: 1.005 (ref 1.030–1.035)
Urobilinogen, UA: 0.2 (ref ?–2.0)
pH, UA: 6.5 (ref 5.0–8.0)

## 2016-04-18 MED ORDER — AMLODIPINE BESYLATE 10 MG PO TABS: 10.0000 mg | ORAL_TABLET | Freq: Every day | ORAL | 0 refills | Status: DC

## 2016-04-18 MED ORDER — ATORVASTATIN CALCIUM 40 MG PO TABS: 40.0000 mg | ORAL_TABLET | Freq: Every day | ORAL | 0 refills | Status: DC

## 2016-04-18 NOTE — Progress Notes (Signed)
Subjective:  By signing my name below, I, Yolanda Green, attest that this documentation has been prepared under the direction and in the presence of Yolanda Cheadle, MD Electronically Signed: Ladene Green, ED Scribe 04/18/2016 at 2:18 PM.   Patient ID: Yolanda Green, female    DOB: 1945/05/04, 71 y.o.   MRN: [id number]   Chief Complaint  Patient presents with  . Annual Exam   HPI [name] is a 71 y.o. female who presents to Primary Care at [facility] for a follow-up on HTN. Appears she has been off cholesterol medicine for over 6 months and off amlodipine for 2 weeks. Last seen 1 year prior for medication refills and BP was uncontrolled at that time and the visit prior. Pt was just started on Lipitor at her last visit 1 year prior but never followed up to have liver function rechecked after starting it as she was requested. At her last visit she was off Norvasc for several days. She is afraid off blood draws. We have never seen her while on her medications. Pt had a pap smear was done 5 years ago at [facility] and was negative without HPV testing.   Today, pt states that she has not checked her BP outside of the office within a few months. Triage BP: 157/93. She admits to being non-compliant to both her cholesterol and BP medications but states she restarted both this week. She states that she was on HCTZ for years in the past but switched to amlodipine due to her phobia of needles for the blood draws.   Right Knee Pain Pt reports gradually worsening medial right knee over the past few months. She describes pain as stiffness that is exacerbated with ambulating up stairs. Pt states that she experienced similar pain approximately 11 years ago that self-resolved. She has not tried ice, heat, a knee brace or any medications for pain. She denies joint swelling, color change, hip, ankle or back pain.   Past Medical History:  Diagnosis Date  . Fibroids   . H/O varicella   . History  of measles, mumps, or rubella   . Hypertension   . PMB (postmenopausal bleeding) 2005  . Stenotic cervical os 07/08/03  . Stress 06/20/09  . Trichomonas 05/08/03  . Yeast infection    Current Outpatient Prescriptions on File Prior to Visit  Medication Sig Dispense Refill  . amLODipine (NORVASC) 5 MG tablet TAKE 1 TABLET BY MOUTH EVERY DAY 30 tablet 0  . atorvastatin (LIPITOR) 40 MG tablet TAKE 1 TABLET BY MOUTH EVERY DAY 30 tablet 0   No current facility-administered medications on file prior to visit.    No Known Allergies  Past Surgical History:  Procedure Laterality Date  . DILATION AND CURETTAGE OF UTERUS  07/08/03  . HYSTEROSCOPY  07/08/03  . INGUINAL HERNIA REPAIR  1996  . TUBAL LIGATION  1979  . WISDOM TOOTH EXTRACTION     Family History  Problem Relation Age of Onset  . Cancer Father     Prostate  . Diabetes Sister   . Hearing loss Son   . Cancer Paternal Aunt     3 aunts with breast cancer   Social History   Social History  . Marital status: Married    Spouse name: N/A  . Number of children: N/A  . Years of education: N/A   Social History Main Topics  . Smoking status: Never Smoker  . Smokeless tobacco: Never Used  . Alcohol use  Yes     Comment: occasional glass of wine  . Drug use: No  . Sexual activity: Not Asked     Comment: BTL   Other Topics Concern  . None   Social History Narrative  . None    Review of Systems  Musculoskeletal: Positive for arthralgias. Negative for back pain and joint swelling.  Skin: Negative for color change.  All other systems reviewed and are negative.     Objective:   Physical Exam  Constitutional: She is oriented to person, place, and time. She appears well-developed and well-nourished. No distress.  HENT:  Head: Normocephalic and atraumatic.  Eyes: Conjunctivae and EOM are normal.  Neck: Neck supple. No tracheal deviation present.  Cardiovascular: Normal rate.   Pulmonary/Chest: Effort normal. No respiratory  distress.  Musculoskeletal: Normal range of motion. She exhibits edema.  No tenderness over medial or lateral joint line. Patellar in place. No baker's cyst palpable. 1+ pitting edema on L, 2+ on R. Severe crepitus in bilateral knees. Negative Homan's. FROM. Negative anterior and posterior drawer. No pain with varus or valgus stress. Negative McMurray's. 5/5 hip flexors. Normal quad and hamstring strength 5/5. Plantar and dorsal flexion 5/5. No lumbar process or paraspinal tenderness. Both legs are equal in circumference   Neurological: She is alert and oriented to person, place, and time.  Reflex Scores:      Patellar reflexes are 2+ on the right side and 2+ on the left side.      Achilles reflexes are 1+ on the right side and 1+ on the left side. Skin: Skin is warm and dry.  Psychiatric: Her speech is normal. Her affect is labile. She is agitated. She expresses impulsivity.  Nursing note and vitals reviewed.  BP (!) 157/93   Pulse 85   Temp 97.7 F (36.5 C) (Oral)   Resp 18   Ht 5\' 6"  (1.676 m)   Wt 183 lb (83 kg)   SpO2 94%   BMI 29.54 kg/m    Dg Knee 1-2 Views Right  Result Date: 04/18/2016 CLINICAL DATA:  Medial knee pain with muscle spasm. EXAM: RIGHT KNEE - 1-2 VIEW COMPARISON:  None. FINDINGS: No evidence of fracture, dislocation, or joint effusion. Medial joint space narrowing both knees. Mild patellofemoral spurring on the RIGHT. IMPRESSION: RIGHT knee osteoarthritis. Electronically Signed   By: Staci Righter M.D.   On: 04/18/2016 14:47      Assessment & Plan:   1. Right medial knee pain   2. Essential hypertension   3. Hyperlipidemia, unspecified hyperlipidemia type   4. Medication monitoring encounter   5. Overweight (BMI 25.0-29.9)   6. Primary osteoarthritis of right knee   7. Macrocytosis    Suspect OA as a cause of knee pain. Rec ice, brace, home exercises and nsaids (or perhaps other joint supplements). I told pt this but she did not want to discuss details, did  not want to be rx'ed any medication, try a brace, and was not interested in exercise instruction at this time. She may be willing to discuss this on her return visit.  Advised pt to proceed with labs today to evaluate her chronic medical problems and HTN, HLD, and their medication use. Pt was not fasting but no need to recheck fasting lipids at this time as she has not changed her lifestyle in any way nor started medication since her last fasting lipids so advised ok to proceed with other labs today despite non-fasting state.  Pt agreed. However, when  lab went in to draw her blood she became agitated, refused, and left the office.  PT DOES HAVE A WELL-DOCUMENTED HX OF NEEDLE PHOBIA. Has been able to do blood draws at other times but perhaps today was just to much for her. . . .  Labs cancelled.   Discussed concern for pt's poor hx of med compliance and that she never follows-up as requested waiting until she has ran out and off meds for wks to mos.  Tried to find out reasons behind, not scold, so we can better meet pt at where she is for her healthcare goals but pt would not provide answers but states she does want to be on meds for her chronic conditions and resolves to increase compliance.  MEDICARE PHYSICAL WAS NOT DONE TODAY. Due to the busy schedule at the same day clinic on a Fridayand the amount of time I've already spent with her, time does not permit me to address these routine health maintanence issues at today's visit. I've requested another appointment to review these additional issues.   Orders Placed This Encounter  Procedures  . DG Knee 1-2 Views Right    Standing Status:   Future    Number of Occurrences:   1    Standing Expiration Date:   04/18/2017    Order Specific Question:   Reason for Exam (SYMPTOM  OR DIAGNOSIS REQUIRED)    Answer:   medial knee pain intermittently with muscle spasm prox and distal when walking, suspect OA    Order Specific Question:   Preferred imaging  location?    Answer:   External  . Care order/instruction:    AVS printed - let patient go!  Marland Kitchen POCT urinalysis dipstick    Meds ordered this encounter  Medications  . amLODipine (NORVASC) 10 MG tablet    Sig: Take 1 tablet (10 mg total) by mouth daily.    Dispense:  90 tablet    Refill:  0    Needs office visit for b/p check and refills  . atorvastatin (LIPITOR) 40 MG tablet    Sig: Take 1 tablet (40 mg total) by mouth daily.    Dispense:  90 tablet    Refill:  0    Labs/needed for refills    I personally performed the services described in this documentation, which was scribed in my presence. The recorded information has been reviewed and considered, and addended by me as needed.   Yolanda Green, M.D.  Primary Care at [facility] [address] [place], [place] [address] [phone] phone [phone] fax  04/20/16 11:40 AM

## 2016-04-18 NOTE — Patient Instructions (Addendum)
Managing Your Hypertension Hypertension is commonly called high blood pressure. This is when the force of your blood pressing against the walls of your arteries is too strong. Arteries are blood vessels that carry blood from your heart throughout your body. Hypertension forces the heart to work harder to pump blood, and may cause the arteries to become narrow or stiff. Having untreated or uncontrolled hypertension can cause heart attack, stroke, kidney disease, and other problems. What are blood pressure readings? A blood pressure reading consists of a higher number over a lower number. Ideally, your blood pressure should be below 120/80. The first ("top") number is called the systolic pressure. It is a measure of the pressure in your arteries as your heart beats. The second ("bottom") number is called the diastolic pressure. It is a measure of the pressure in your arteries as the heart relaxes. What does my blood pressure reading mean? Blood pressure is classified into four stages. Based on your blood pressure reading, your health care provider may use the following stages to determine what type of treatment you need, if any. Systolic pressure and diastolic pressure are measured in a unit called mm Hg. Normal   Systolic pressure: below 120.  Diastolic pressure: below 80. Elevated   Systolic pressure: 120-129.  Diastolic pressure: below 80. Hypertension stage 1     Diastolic pressure: 80-89. Hypertension stage 2   Systolic pressure: 140 or above.  Diastolic pressure: 90 or above. What health risks are associated with hypertension? Managing your hypertension is an important responsibility. Uncontrolled hypertension can lead to:  A heart attack.  A stroke.  A weakened blood vessel (aneurysm).  Heart failure.  Kidney damage.  Eye damage.  Metabolic syndrome.  Memory and concentration problems. What changes can I make to manage my hypertension? Eating and drinking   Eat a  diet that is high in fiber and potassium, and low in salt (sodium), added sugar, and fat. An example eating plan is called the DASH (Dietary Approaches to Stop Hypertension) diet. To eat this way:  Eat plenty of fresh fruits and vegetables. Try to fill half of your plate at each meal with fruits and vegetables.  Eat whole grains, such as whole wheat pasta, brown rice, or whole grain bread. Fill about one quarter of your plate with whole grains.  Eat low-fat diary products.  Avoid fatty cuts of meat, processed or cured meats, and poultry with skin. Fill about one quarter of your plate with lean proteins such as fish, chicken without skin, beans, eggs, and tofu.  Avoid premade and processed foods. These tend to be higher in sodium, added sugar, and fat.     Lifestyle   Work with your health care provider to maintain a healthy body weight, or to lose weight. Ask what an ideal weight is for you.  Get at least 30 minutes of exercise that causes your heart to beat faster (aerobic exercise) most days of the week. Activities may include walking, swimming, or biking.       Monitoring   Monitor your blood pressure at home as told by your health care provider. Your personal target blood pressure may vary depending on your medical conditions, your age, and other factors.  Have your blood pressure checked regularly, as often as told by your health care provider. Working with your health care provider   Review all the medicines you take with your health care provider because there may be side effects or interactions.  Talk with your health   care provider about your diet, exercise habits, and other lifestyle factors that may be contributing to hypertension.  Visit your health care provider regularly. Your health care provider can help you create and adjust your plan for managing hypertension. Will I need medicine to control my blood pressure? Your health care provider may prescribe medicine if  lifestyle changes are not enough to get your blood pressure under control, and if:  Your systolic blood pressure is 130 or higher.  Your diastolic blood pressure is 80 or higher. Take medicines only as told by your health care provider. Follow the directions carefully. Blood pressure medicines must be taken as prescribed. The medicine does not work as well when you skip doses. Skipping doses also puts you at risk for problems. Contact a health care provider if:  You think you are having a reaction to medicines you have taken.  You have repeated (recurrent) headaches.  You feel dizzy.  You have swelling in your ankles.  You have trouble with your vision. Get help right away if:  You develop a severe headache or confusion.  You have unusual weakness or numbness, or you feel faint.  You have severe pain in your chest or abdomen.  You vomit repeatedly.  You have trouble breathing. Summary  Hypertension is when the force of blood pumping through your arteries is too strong. If this condition is not controlled, it may put you at risk for serious complications.  Your personal target blood pressure may vary depending on your medical conditions, your age, and other factors. For most people, a normal blood pressure is less than 120/80.  Hypertension is managed by lifestyle changes, medicines, or both. Lifestyle changes include weight loss, eating a healthy, low-sodium diet, exercising more, and limiting alcohol. This information is not intended to replace advice given to you by your health care provider. Make sure you discuss any questions you have with your health care provider. Document Released: 09/24/2011 Document Revised: 11/28/2015 Document Reviewed: 11/28/2015 Elsevier Interactive Patient Education  2017 [employer].  Patellofemoral Pain Syndrome Patellofemoral pain syndrome is a condition that involves a softening or breakdown of the tissue (cartilage) on the underside of your  kneecap (patella). This causes pain in the front of the knee. The condition is also called runner's knee or chondromalacia patella. Patellofemoral pain syndrome is most common in young adults who are active in sports. Your knee is the largest joint in your body. The patella covers the front of your knee and is attached to muscles above and below your knee. The underside of the patella is covered with a smooth type of cartilage (synovium). The smooth surface helps the patella glide easily when you move your knee. Patellofemoral pain syndrome causes swelling in the joint linings and bone surfaces in your knee. What are the causes? Patellofemoral pain syndrome can be caused by:  Overuse.  Poor alignment of your knee joints.  Weak leg muscles.  A direct blow to your kneecap. What increases the risk? You may be at risk for patellofemoral pain syndrome if you:  Do a lot of activities that can wear down your kneecap. These include:  Running.  Squatting.  Climbing stairs.  Start a new physical activity or exercise program.  Wear shoes that do not fit well.  Do not have good leg strength.  Are overweight. What are the signs or symptoms? Knee pain is the most common symptom of patellofemoral pain syndrome. This may feel like a dull, aching pain underneath your patella,  in the front of your knee. There may be a popping or cracking sound when you move your knee. Pain may get worse with:  Exercise.  Climbing stairs.  Running.  Jumping.  Squatting.  Kneeling.  Sitting for a long time.  Moving or pushing on your patella. How is this diagnosed? Your health care provider may be able to diagnose patellofemoral pain syndrome from your symptoms and medical history. You may be asked about your recent physical activities and which ones cause knee pain. Your health care provider may do a physical exam with certain tests to confirm the diagnosis. These may include:  Moving your patella  back and forth.  Checking your range of knee motion.  Having you squat or jump to see if you have pain.  Checking the strength of your leg muscles. An MRI of the knee may also be done. How is this treated? Patellofemoral pain syndrome can usually be treated at home with rest, ice, compression, and elevation (RICE). Other treatments may include:  Nonsteroidal anti-inflammatory drugs (NSAIDs).  Physical therapy to stretch and strengthen your leg muscles.  Shoe inserts (orthotics) to take stress off your knee.  A knee brace or knee support.  Surgery to remove damaged cartilage or move the patella to a better position. The need for surgery is rare. Follow these instructions at home:  Take medicines only as directed by your health care provider.  Rest your knee.  When resting, keep your knee raised above the level of your heart.  Avoid activities that cause knee pain.  Apply ice to the injured area:  Put ice in a plastic bag.  Place a towel between your skin and the bag.  Leave the ice on for 20 minutes, 2-3 times a day.  Use splints, braces, knee supports, or walking aids as directed by your health care provider.  Perform stretching and strengthening exercises as directed by your health care provider or physical therapist.  Keep all follow-up visits as directed by your health care provider. This is important. Contact a health care provider if:  Your symptoms get worse.  You are not improving with home care. This information is not intended to replace advice given to you by your health care provider. Make sure you discuss any questions you have with your health care provider. Document Released: 12/18/2008 Document Revised: 06/07/2015 Document Reviewed: 03/21/2013 Elsevier Interactive Patient Education  2017 [employer].

## 2016-04-18 NOTE — Progress Notes (Deleted)
Subjective:    Yolanda Green is a 71 y.o. female who presents for Medicare Initial preventive examination.  Preventive Screening-Counseling & Management  Tobacco History  Smoking Status  . Never Smoker  Smokeless Tobacco  . Never Used     Problems Prior to Visit 1. Pt is medically non-compliant. She stopped the cholesterol and her blood pressure medication for sev mos and restarted last wk.  2. Severe needle phobia.   Current Problems (verified) Patient Active Problem List   Diagnosis Date Noted  . HYPERTENSION 12/23/2006  . CHICKENPOX, HX OF 12/23/2006    Medications Prior to Visit Current Outpatient Prescriptions on File Prior to Visit  Medication Sig Dispense Refill  . amLODipine (NORVASC) 5 MG tablet TAKE 1 TABLET BY MOUTH EVERY DAY 30 tablet 0  . atorvastatin (LIPITOR) 40 MG tablet TAKE 1 TABLET BY MOUTH EVERY DAY 30 tablet 0   No current facility-administered medications on file prior to visit.     Current Medications (verified) Current Outpatient Prescriptions  Medication Sig Dispense Refill  . amLODipine (NORVASC) 5 MG tablet TAKE 1 TABLET BY MOUTH EVERY DAY 30 tablet 0  . atorvastatin (LIPITOR) 40 MG tablet TAKE 1 TABLET BY MOUTH EVERY DAY 30 tablet 0   No current facility-administered medications for this visit.      Allergies (verified) Patient has no known allergies.   PAST HISTORY  Family History Family History  Problem Relation Age of Onset  . Cancer Father     Prostate  . Diabetes Sister   . Hearing loss Son   . Cancer Paternal Aunt     3 aunts with breast cancer    Social History Social History  Substance Use Topics  . Smoking status: Never Smoker  . Smokeless tobacco: Never Used  . Alcohol use Yes     Comment: occasional glass of wine     Are there smokers in your home (other than you)? {yes/no:20286}  Risk Factors Current exercise habits: {exercise:19826}  Dietary issues discussed: ***   Cardiac risk factors: {risk  factors:510}.  Depression Screen (Note: if answer to either of the following is "Yes", a more complete depression screening is indicated)   Over the past 2 weeks, have you felt down, depressed or hopeless? {yes/no:20286}  Over the past 2 weeks, have you felt little interest or pleasure in doing things? {yes/no:20286}  Have you lost interest or pleasure in daily life? {yes/no:20286}  Do you often feel hopeless? {yes/no:20286}  Do you cry easily over simple problems? {yes/no:20286}  Activities of Daily Living In your present state of health, do you have any difficulty performing the following activities?:  Driving? {yes/no:20286} Managing money?  {Responses; yes/no (default no):140031::"No"} Feeding yourself? {yes/no:20286} Getting from bed to chair? {yes/no:20286}{exam, Complete:18323} Climbing a flight of stairs? {yes/no:20286} Preparing food and eating?: {yes/no (default no):140031::"No"} Bathing or showering? {Responses; yes/no (default no):140031::"No"} Getting dressed: {yes/no (default no):140031::"No"} Getting to the toilet? {Responses; yes/no (default no):140031::"No"} Using the toilet:{yes/no (default no):140031::"No"} Moving around from place to place: {yes/no (default no):140031::"No"} In the past year have you fallen or had a near fall?:{yes/no (default no):140031::"No"}   Are you sexually active?  {yes/no:20286}  Do you have more than one partner?  {Responses; yes/no (default no):140031::"No"}  Hearing Difficulties: {yes/no (default no):140031::"No"} Do you often ask people to speak up or repeat themselves? {Responses; yes/no (default no):140031::"No"} Do you experience ringing or noises in your ears? {Responses; yes/no (default no):140031::"No"} Do you have difficulty understanding soft or whispered voices? {Responses; yes/no (  default no):140031::"No"}   Do you feel that you have a problem with memory? {yes/no:20286}  Do you often misplace items? {yes/no:20286}  Do you  feel safe at home?  {yes/no:20286}  Cognitive Testing  Alert? {yes/no:20286}  Normal Appearance?{yes/no:20286}  Oriented to person? {yes/no:20286}  Place? {yes/no:20286}   Time? {yes/no:20286}  Recall of three objects?  {yes/no:20286}  Can perform simple calculations? {yes/no:20286}  Displays appropriate judgment?{yes/no:20286}  Can read the correct time from a watch face?{yes/no:20286}   Advanced Directives have been discussed with the patient? {yes/no:20286}  List the Names of Other Physician/Practitioners you currently use: 1.    Indicate any recent Medical Services you may have received from other than [facility] providers in the past year (date may be approximate).   There is no immunization history on file for this patient.  Screening Tests Health Maintenance  Topic Date Due  . Hepatitis C Screening  [birth year]  . TETANUS/TDAP  09/30/1966  . COLONOSCOPY  09/29/1997  . DEXA SCAN  09/29/2012  . PNA vac Low Risk Adult (1 of 2 - PCV13) 09/29/2012  . MAMMOGRAM  06/13/2016  . INFLUENZA VACCINE  08/13/2016    All answers were reviewed with the patient and necessary referrals were made:  SHAW,EVA, MD   04/18/2016   History reviewed: {history reviewed:20406::"allergies","current medications","past family history","past medical history","past social history","past surgical history","problem list"}  Review of Systems {ros; complete:30496}    Objective:     Vision by Snellen chart: right eye:{vision:19455::"20/20"}, left eye:{vision:19455::"20/20"}  Body mass index is 29.54 kg/m. BP (!) 157/93   Pulse 85   Temp 97.7 F (36.5 C) (Oral)   Resp 18   Ht 5\' 6"  (1.676 m)   Wt 183 lb (83 kg)   SpO2 94%   BMI 29.54 kg/m   {Exam, female:18323}     Assessment:     ***     Plan:     During the course of the visit the patient was educated and counseled about appropriate screening and preventive services including:    {plan:19836}  Diet review for nutrition referral?  Yes ____  Not Indicated ____   Patient Instructions (the written plan) was given to the patient.  Medicare Attestation I have personally reviewed: The patient's medical and social history Their use of alcohol, tobacco or illicit drugs Their current medications and supplements The patient's functional ability including ADLs,fall risks, home safety risks, cognitive, and hearing and visual impairment Diet and physical activities Evidence for depression or mood disorders  The patient's weight, height, BMI, and visual acuity have been recorded in the chart.  I have made referrals, counseling, and provided education to the patient based on review of the above and I have provided the patient with a written personalized care plan for preventive services.     Delman Cheadle, MD   04/18/2016

## 2016-04-26 ENCOUNTER — Ambulatory Visit (INDEPENDENT_AMBULATORY_CARE_PROVIDER_SITE_OTHER): Payer: Medicare Other | Admitting: Family Medicine

## 2016-04-26 VITALS — BP 133/83 | HR 88 | Temp 98.2°F | Resp 24 | Ht 64.5 in | Wt 180.2 lb

## 2016-04-26 DIAGNOSIS — E785 Hyperlipidemia, unspecified: Secondary | ICD-10-CM | POA: Diagnosis not present

## 2016-04-26 DIAGNOSIS — I10 Essential (primary) hypertension: Secondary | ICD-10-CM | POA: Diagnosis not present

## 2016-04-26 DIAGNOSIS — M1711 Unilateral primary osteoarthritis, right knee: Secondary | ICD-10-CM | POA: Diagnosis not present

## 2016-04-26 DIAGNOSIS — M79604 Pain in right leg: Secondary | ICD-10-CM | POA: Diagnosis not present

## 2016-04-26 DIAGNOSIS — Z0000 Encounter for general adult medical examination without abnormal findings: Secondary | ICD-10-CM

## 2016-04-26 NOTE — Patient Instructions (Addendum)
Continue taking amlodipine daily for the blood pressure  Encourage getting some more regular exercise.  Take acetaminophen (Tylenol) 500 mg extra strength tablets 2 pills maximum of 3 times daily. You might file to 2 every evening and to every morning it would help you do not feel so much discomfort.  We will let you know the results of your labs sometime during the next week.    IF you received an x-ray today, you will receive an invoice from [facility]. Please contact [facility] at [phone] with questions or concerns regarding your invoice.   IF you received labwork today, you will receive an invoice from [facility]. Please contact [facility] at [phone] with questions or concerns regarding your invoice.   Our billing staff will not be able to assist you with questions regarding bills from these companies.  You will be contacted with the lab results as soon as they are available. The fastest way to get your results is to activate your My Chart account. Instructions are located on the last page of this paperwork. If you have not heard from [place] regarding the results in 2 weeks, please contact this office.

## 2016-04-26 NOTE — Progress Notes (Signed)
I. Hyperlipidemia: Patient is here for a evaluation of her problems. She is not currently taking any medication.  Objective: Old labs show that she has had elevations in the past.  Assessment: Hyperlipidemia  Plan: Check lipids   II. Osteoarthritis Chronic right knee pain, especially below the  Objective: Crepitance of knees. DJD on x-ray  Assessment: DJD of knees with knee pain  Plan: Symptomatic therapy at this time unless he gets to where its intolerable.

## 2016-04-26 NOTE — Progress Notes (Signed)
Patient ID: Yolanda Green, female    DOB: 1945/08/26  Age: 71 y.o. MRN: [id number]  Chief Complaint  Patient presents with  . Annual Exam    Subjective:   Physical examination:  History: This is a 71 year old lady who is here wanting a physical examination. She felt like she was here recently some of her concerns were ignored on her lipids and other issues. She just wanted a good going over. She has been quite healthy.  Past history: Operations: She has had a ventral hernia repair. She had 2 children. Medical illnesses: None. Has a history of hypertension which is managed with medication, and has had a history of hyperlipidemia for which she is not on medications. Medications: Amlodipine 10 mg daily. Rarely takes OTC medications. Medication allergies: None known  Social history: She was widowed several years ago when her husband died at age 87 with multiple myeloma. She still is working through a [employer], couple days a week. She lives alone, is not sexually involved. She is a member of [employer]. She has 1 daughter in [place] and one in [place]. She has 2 grandchildren. She is not getting current regular exercise.  Family history: Unremarkable  View systems: Constitutional: Unremarkable HEENT: Unremarkable Cardiovascular: Unremarkable Respiratory: Unremarkable GI: Unremarkable. Had a colonoscopy a year ago by Dr. Collene Mares GU: Unremarkable Musculoskeletal: Has pain in the right knee and pain distal to the right knee anteriorly. No injury. She had an x-ray a few weeks ago. This was reviewed and reveals osteoarthritis with medial narrowing of the joint space. Dermatologic: Unremarkable Neurologic: Unremarkable Psychiatric: Unremarkable Endocrine: Unremarkable   Current allergies, medications, problem list, past/family and social histories reviewed.  Objective:  BP 133/83   Pulse 88   Temp 98.2 F (36.8 C) (Oral)   Resp (!) 24   Ht 5' 4.5" (1.638 m)   Wt  180 lb 4 oz (81.8 kg)   SpO2 94%   BMI 30.46 kg/m   Physical exam: Pleasant lady, fully alert and oriented, no acute distress. Wears a hair piece but she says her hair is not thinned out. Her eyes PERRLA. TMs normal. Throat clear. Neck supple without nodes or thyromegaly. No carotid bruits. Chest is clear to auscultation. Heart regular without murmurs gallops or arrhythmias. Breasts are symmetrical. Any masses. No axillary nodes. Himself mass or tenderness. Pelvic exam not done. Extremities without edema. No gross deformities. The knees have a little bit of crepitance in them right more than the left. The right has a little popping in it. Pedal pulses are adequate. She has some hammertoe formation and calluses.  Assessment & Plan:   Assessment: 1. Annual physical exam   2. Hyperlipidemia, unspecified hyperlipidemia type   3. Anterior leg pain, right   4. Primary osteoarthritis of right knee   5. Essential hypertension       Plan: Appears good eye examination. We will check some labs get back to her on those results. In the meanwhile it is appropriate for her to continue on amlodipine which is controlling her blood pressure well. See instructions.  Orders Placed This Encounter  Procedures  . CBC  . Comprehensive metabolic panel  . Lipid panel    No orders of the defined types were placed in this encounter.        Patient Instructions   Continue taking amlodipine daily for the blood pressure  Encourage getting some more regular exercise.  Take acetaminophen (Tylenol) 500 mg extra strength tablets 2 pills  maximum of 3 times daily. You might file to 2 every evening and to every morning it would help you do not feel so much discomfort.  We will let you know the results of your labs sometime during the next week.    IF you received an x-ray today, you will receive an invoice from [facility]. Please contact [facility] at [phone] with questions or  concerns regarding your invoice.   IF you received labwork today, you will receive an invoice from [place]. Please contact [facility] at [phone] with questions or concerns regarding your invoice.   Our billing staff will not be able to assist you with questions regarding bills from these companies.  You will be contacted with the lab results as soon as they are available. The fastest way to get your results is to activate your My Chart account. Instructions are located on the last page of this paperwork. If you have not heard from [place] regarding the results in 2 weeks, please contact this office.        No Follow-up on file.   HOPPER,DAVID, MD 04/26/2016

## 2016-04-27 LAB — COMPREHENSIVE METABOLIC PANEL
ALT: 16 IU/L (ref 0–32)
AST: 21 IU/L (ref 0–40)
Albumin/Globulin Ratio: 1.4 (ref 1.2–2.2)
Albumin: 4.1 g/dL (ref 3.6–4.8)
Alkaline Phosphatase: 92 IU/L (ref 39–117)
BUN/Creatinine Ratio: 18 (ref 12–28)
BUN: 19 mg/dL (ref 8–27)
Bilirubin Total: 0.4 mg/dL (ref 0.0–1.2)
CO2: 25 mmol/L (ref 18–29)
Calcium: 9.4 mg/dL (ref 8.7–10.3)
Chloride: 101 mmol/L (ref 96–106)
Creatinine, Ser: 1.03 mg/dL — ABNORMAL HIGH (ref 0.57–1.00)
GFR calc Af Amer: 65 mL/min/{1.73_m2} (ref >59)
GFR calc non Af Amer: 56 mL/min/{1.73_m2} — ABNORMAL LOW (ref >59)
Globulin, Total: 3 g/dL (ref 1.5–4.5)
Glucose: 84 mg/dL (ref 65–99)
Potassium: 4.9 mmol/L (ref 3.5–5.2)
Sodium: 141 mmol/L (ref 134–144)
Total Protein: 7.1 g/dL (ref 6.0–8.5)

## 2016-04-27 LAB — LIPID PANEL
Chol/HDL Ratio: 3.4 ratio (ref 0.0–4.4)
Cholesterol, Total: 177 mg/dL (ref 100–199)
HDL: 52 mg/dL (ref >39)
LDL Calculated: 109 mg/dL — ABNORMAL HIGH (ref 0–99)
Triglycerides: 81 mg/dL (ref 0–149)
VLDL Cholesterol Cal: 16 mg/dL (ref 5–40)

## 2016-04-27 LAB — CBC
Hematocrit: 45.3 % (ref 34.0–46.6)
Hemoglobin: 15.5 g/dL (ref 11.1–15.9)
MCH: 33.4 pg — ABNORMAL HIGH (ref 26.6–33.0)
MCHC: 34.2 g/dL (ref 31.5–35.7)
MCV: 98 fL — ABNORMAL HIGH (ref 79–97)
Platelets: 244 10*3/uL (ref 150–379)
RBC: 4.64 x10E6/uL (ref 3.77–5.28)
RDW: 13.7 % (ref 12.3–15.4)
WBC: 8.2 10*3/uL (ref 3.4–10.8)

## 2016-05-01 ENCOUNTER — Encounter: Payer: Self-pay | Admitting: Radiology

## 2016-06-06 DIAGNOSIS — Z7189 Other specified counseling: Secondary | ICD-10-CM | POA: Diagnosis not present

## 2016-06-06 DIAGNOSIS — Z23 Encounter for immunization: Secondary | ICD-10-CM | POA: Diagnosis not present

## 2016-07-03 DIAGNOSIS — H2513 Age-related nuclear cataract, bilateral: Secondary | ICD-10-CM | POA: Diagnosis not present

## 2016-08-02 ENCOUNTER — Telehealth: Payer: Self-pay | Admitting: Family Medicine

## 2016-08-02 DIAGNOSIS — E785 Hyperlipidemia, unspecified: Secondary | ICD-10-CM

## 2016-08-02 DIAGNOSIS — I10 Essential (primary) hypertension: Secondary | ICD-10-CM

## 2016-08-07 ENCOUNTER — Other Ambulatory Visit: Payer: Self-pay | Admitting: Family Medicine

## 2016-08-07 ENCOUNTER — Telehealth: Payer: Self-pay | Admitting: Family Medicine

## 2016-08-07 DIAGNOSIS — I10 Essential (primary) hypertension: Secondary | ICD-10-CM

## 2016-08-07 NOTE — Telephone Encounter (Signed)
LMOM FOR PT TO CALL TO SCHEDULE AN OV WITH SHAW FOR MED REFILL ON LIPITIOR AND NORVASC

## 2016-08-15 ENCOUNTER — Ambulatory Visit: Payer: Medicare Other | Admitting: Family Medicine

## 2016-09-17 NOTE — Addendum Note (Signed)
Addended by: Tenna Delaine D on: 09/17/2016 06:32 PM   Modules accepted: Level of Service

## 2017-01-17 ENCOUNTER — Encounter: Payer: Self-pay | Admitting: Family Medicine

## 2017-01-17 ENCOUNTER — Other Ambulatory Visit: Payer: Self-pay

## 2017-01-17 ENCOUNTER — Ambulatory Visit (INDEPENDENT_AMBULATORY_CARE_PROVIDER_SITE_OTHER): Payer: Medicare Other | Admitting: Family Medicine

## 2017-01-17 VITALS — BP 148/88 | HR 92 | Temp 98.6°F | Resp 16 | Ht 66.14 in | Wt 181.0 lb

## 2017-01-17 DIAGNOSIS — R51 Headache: Secondary | ICD-10-CM

## 2017-01-17 DIAGNOSIS — E785 Hyperlipidemia, unspecified: Secondary | ICD-10-CM | POA: Diagnosis not present

## 2017-01-17 DIAGNOSIS — I10 Essential (primary) hypertension: Secondary | ICD-10-CM | POA: Diagnosis not present

## 2017-01-17 DIAGNOSIS — R519 Headache, unspecified: Secondary | ICD-10-CM

## 2017-01-17 DIAGNOSIS — Z1159 Encounter for screening for other viral diseases: Secondary | ICD-10-CM | POA: Diagnosis not present

## 2017-01-17 MED ORDER — ATORVASTATIN CALCIUM 40 MG PO TABS: 40.0000 mg | ORAL_TABLET | Freq: Every day | ORAL | 1 refills | Status: DC

## 2017-01-17 MED ORDER — AMLODIPINE BESYLATE 10 MG PO TABS: 10.0000 mg | ORAL_TABLET | Freq: Every day | ORAL | 1 refills | Status: DC

## 2017-01-17 NOTE — Patient Instructions (Addendum)
I restarted same dose of meds. If headache does not improve - return for recheck.Return to the clinic or go to the nearest emergency room if any of your symptoms worsen or new symptoms occur.  Please select a primary provider here and follow up in next 2 months for a physical to recheck blood pressure on meds, and can check your cholesterol ON meds at that time. I do recommend tetanus, and pneumonia vaccines as discussed today.   We can check into status of colonoscopy at your physical, but if you can check into date of your last colonoscopy and let me know sooner. Check into date of your last bone density test and let me know. Keep upcoming appointment for mammogram.   Thank you for coming in today.   IF you received an x-ray today, you will receive an invoice from [facility]. Please contact [facility] at [phone] with questions or concerns regarding your invoice.   IF you received labwork today, you will receive an invoice from [place]. Please contact [facility] at [phone] with questions or concerns regarding your invoice.   Our billing staff will not be able to assist you with questions regarding bills from these companies.  You will be contacted with the lab results as soon as they are available. The fastest way to get your results is to activate your My Chart account. Instructions are located on the last page of this paperwork. If you have not heard from [place] regarding the results in 2 weeks, please contact this office.

## 2017-01-17 NOTE — Progress Notes (Signed)
Subjective:  By signing my name below, I, Moises Blood, attest that this documentation has been prepared under the direction and in the presence of Merri Ray, MD. Electronically Signed: Moises Blood, [place]. 01/17/2017 , 9:38 AM .  Patient was seen in Room 11 .   Patient ID: Yolanda Green, female    DOB: 09-10-1945, 72 y.o.   MRN: [id number] Chief Complaint  Patient presents with  . Hypertension    pt states she has not had her B/P medication x 2 months and now having minor headache   . Medication Refill    both medications   HPI Yolanda Green is a 72 y.o. female  She complains of a minor headache. She has a history of HTN, and hyperlipidemia. She was last seen in April for wellness visit. Recommended 6 month follow up. Her PCP is listed as Laurey Morale, MD, but she's been coming here for her regular care for past 5 years.   She mentions she hasn't been in to be seen due to fear of needles for blood work. She isn't fasting today, ate breakfast (cereal + coffee) 2 hours prior to office visit.   HTN She is taking amlodipine 10mg  QD previously. She's been out of this medication for 2 months.   Lab Results  Component Value Date   CREATININE 1.03 (H) 04/26/2016   Patient states she's been out of her medication for a couple of months, and has been having minor headaches after eating whole bag of chips, spicy foods or any increased salt in diet. Her headaches improve after drinking water. She denied complications with her amlodipine. She denies any weakness, lightheadedness, unsteadiness, shortness of breath, chest pain or chest tightness.   Hyperlipidemia Lab Results  Component Value Date   CHOL 177 04/26/2016   HDL 52 04/26/2016   LDLCALC 109 (H) 04/26/2016   TRIG 81 04/26/2016   CHOLHDL 3.4 04/26/2016   Lab Results  Component Value Date   ALT 16 04/26/2016   AST 21 04/26/2016   ALKPHOS 92 04/26/2016   BILITOT 0.4 04/26/2016   She takes Lipitor 40mg  QD.  She is also out of this medication recently. She denies any complications with her medication; denies any new muscle aches.   Health maintenance She is due for Hep C screening, colonoscopy, DEXA scan, mammogram, pneumonia vaccine and tetanus vaccine.   She agrees to Hep C screening today.  Immunizations: She wants to defer her vaccinations today.  Colonoscopy: Reports last done by Dr. Collene Mares about 3 years ago; repeat in 5 years.  Mammogram: Last done Dec 2017; scheduled on Jan 14th.  DEXA scan: Believes she's had this done; will bring in copy of results at later visit.   Patient Active Problem List   Diagnosis Date Noted  . Essential hypertension 12/23/2006  . CHICKENPOX, HX OF 12/23/2006   Past Medical History:  Diagnosis Date  . Fibroids   . H/O varicella   . History of measles, mumps, or rubella   . Hypertension   . PMB (postmenopausal bleeding) 2005  . Stenotic cervical os 07/08/03  . Stress 06/20/09  . Trichomonas 05/08/03  . Yeast infection    Past Surgical History:  Procedure Laterality Date  . DILATION AND CURETTAGE OF UTERUS  07/08/03  . HYSTEROSCOPY  07/08/03  . INGUINAL HERNIA REPAIR  1996  . TUBAL LIGATION  1979  . WISDOM TOOTH EXTRACTION     No Known Allergies Prior to Admission medications   Medication Sig  Start Date End Date Taking? Authorizing Provider  amLODipine (NORVASC) 10 MG tablet Take 1 tablet (10 mg total) by mouth daily. 04/18/16   Shawnee Knapp, MD  atorvastatin (LIPITOR) 40 MG tablet Take 1 tablet (40 mg total) by mouth daily. 04/18/16   Shawnee Knapp, MD   Social History   Socioeconomic History  . Marital status: Married    Spouse name: Not on file  . Number of children: Not on file  . Years of education: Not on file  . Highest education level: Not on file  Social Needs  . Financial resource strain: Not on file  . Food insecurity - worry: Not on file  . Food insecurity - inability: Not on file  . Transportation needs - medical: Not on file  .  Transportation needs - non-medical: Not on file  Occupational History  . Not on file  Tobacco Use  . Smoking status: Never Smoker  . Smokeless tobacco: Never Used  Substance and Sexual Activity  . Alcohol use: Yes    Comment: occasional glass of wine  . Drug use: No  . Sexual activity: Not on file    Comment: BTL  Other Topics Concern  . Not on file  Social History Narrative  . Not on file   Review of Systems  Constitutional: Negative for fatigue and unexpected weight change.  Respiratory: Negative for chest tightness and shortness of breath.   Cardiovascular: Negative for chest pain, palpitations and leg swelling.  Gastrointestinal: Negative for abdominal pain and blood in stool.  Neurological: Positive for headaches. Negative for dizziness, syncope and light-headedness.       Objective:   Physical Exam  Constitutional: She is oriented to person, place, and time. She appears well-developed and well-nourished.  HENT:  Head: Normocephalic and atraumatic.  Eyes: Conjunctivae and EOM are normal. Pupils are equal, round, and reactive to light.  Neck: Carotid bruit is not present.  Cardiovascular: Normal rate, regular rhythm, normal heart sounds and intact distal pulses.  Pulmonary/Chest: Effort normal and breath sounds normal.  Abdominal: Soft. She exhibits no pulsatile midline mass. There is no tenderness.  Neurological: She is alert and oriented to person, place, and time. She displays a negative Romberg sign. Coordination and gait normal.  Non focal neuro exam, no pronator drift  Skin: Skin is warm and dry.  Psychiatric: She has a normal mood and affect. Her behavior is normal.  Vitals reviewed.   Vitals:   01/17/17 0914  BP: (!) 148/88  Pulse: 92  Resp: 16  Temp: 98.6 F (37 C)  TempSrc: Oral  SpO2: 95%  Weight: 181 lb (82.1 kg)  Height: 5' 6.14" (1.68 m)      Assessment & Plan:   Yolanda Green is a 72 y.o. female Essential hypertension - Plan:  Comprehensive metabolic panel, amLODipine (NORVASC) 10 MG tablet. Nonintractable episodic headache, unspecified headache type    -uncontrolled off medications. Stressed importance of routine follow-up including on medications, and medication adherence  -Restart amlodipine at prior dose of 10 mg, recheck in 2 months. RTC precautions of headaches or not improving. Nonfocal exam of present  Hyperlipidemia, unspecified hyperlipidemia type - Plan: Comprehensive metabolic panel, atorvastatin (LIPITOR) 40 MG tablet  -Testing deferred as off medications as well as not fasting at this visit. Was tolerating Lipitor when taking. Restart Lipitor same dose, return for repeat exam and lipid panel in 2 months  Need for hepatitis C screening test - Plan: Hepatitis C antibody  Other health  maintenance items addressed as above. Has scheduled upcoming mammogram, she will check into date of colonoscopy as well as bone density testing. She declined pneumonia vaccination or tetanus today, but may agree to this next visit Meds ordered this encounter  Medications  . atorvastatin (LIPITOR) 40 MG tablet    Sig: Take 1 tablet (40 mg total) by mouth daily.    Dispense:  90 tablet    Refill:  1  . amLODipine (NORVASC) 10 MG tablet    Sig: Take 1 tablet (10 mg total) by mouth daily.    Dispense:  90 tablet    Refill:  1   Patient Instructions   I restarted same dose of meds. If headache does not improve - return for recheck.Return to the clinic or go to the nearest emergency room if any of your symptoms worsen or new symptoms occur.  Please select a primary provider here and follow up in next 2 months for a physical to recheck blood pressure on meds, and can check your cholesterol ON meds at that time. I do recommend tetanus, and pneumonia vaccines as discussed today.   We can check into status of colonoscopy at your physical, but if you can check into date of your last colonoscopy and let me know sooner. Check into  date of your last bone density test and let me know. Keep upcoming appointment for mammogram.   Thank you for coming in today.   IF you received an x-ray today, you will receive an invoice from [facility]. Please contact [facility] at [phone] with questions or concerns regarding your invoice.   IF you received labwork today, you will receive an invoice from [facility]. Please contact [facility] at [phone] with questions or concerns regarding your invoice.   Our billing staff will not be able to assist you with questions regarding bills from these companies.  You will be contacted with the lab results as soon as they are available. The fastest way to get your results is to activate your My Chart account. Instructions are located on the last page of this paperwork. If you have not heard from [place] regarding the results in 2 weeks, please contact this office.       I personally performed the services described in this documentation, which was scribed in my presence. The recorded information has been reviewed and considered for accuracy and completeness, addended by me as needed, and agree with information above.  Signed,   Merri Ray, MD Primary [facility] at [place].  01/17/17 9:42 AM

## 2017-01-18 LAB — COMPREHENSIVE METABOLIC PANEL
ALT: 19 IU/L (ref 0–32)
AST: 18 IU/L (ref 0–40)
Albumin/Globulin Ratio: 1.3 (ref 1.2–2.2)
Albumin: 3.9 g/dL (ref 3.6–4.8)
Alkaline Phosphatase: 94 IU/L (ref 39–117)
BUN/Creatinine Ratio: 19 (ref 12–28)
BUN: 18 mg/dL (ref 8–27)
Bilirubin Total: 0.3 mg/dL (ref 0.0–1.2)
CO2: 21 mmol/L (ref 20–29)
Calcium: 9.8 mg/dL (ref 8.7–10.3)
Chloride: 107 mmol/L — ABNORMAL HIGH (ref 96–106)
Creatinine, Ser: 0.96 mg/dL (ref 0.57–1.00)
GFR calc Af Amer: 70 mL/min/{1.73_m2} (ref >59)
GFR calc non Af Amer: 61 mL/min/{1.73_m2} (ref >59)
Globulin, Total: 3.1 g/dL (ref 1.5–4.5)
Glucose: 76 mg/dL (ref 65–99)
Potassium: 4.9 mmol/L (ref 3.5–5.2)
Sodium: 142 mmol/L (ref 134–144)
Total Protein: 7 g/dL (ref 6.0–8.5)

## 2017-01-18 LAB — HEPATITIS C ANTIBODY: Hep C Virus Ab: <0.1 s/co ratio (ref 0.0–0.9)

## 2017-01-27 DIAGNOSIS — Z6828 Body mass index (BMI) 28.0-28.9, adult: Secondary | ICD-10-CM | POA: Diagnosis not present

## 2017-01-27 DIAGNOSIS — Z01411 Encounter for gynecological examination (general) (routine) with abnormal findings: Secondary | ICD-10-CM | POA: Diagnosis not present

## 2017-01-27 DIAGNOSIS — Z124 Encounter for screening for malignant neoplasm of cervix: Secondary | ICD-10-CM | POA: Diagnosis not present

## 2017-01-27 DIAGNOSIS — N951 Menopausal and female climacteric states: Secondary | ICD-10-CM | POA: Diagnosis not present

## 2017-01-27 DIAGNOSIS — Z1231 Encounter for screening mammogram for malignant neoplasm of breast: Secondary | ICD-10-CM | POA: Diagnosis not present

## 2017-01-29 ENCOUNTER — Encounter: Payer: Self-pay | Admitting: *Deleted

## 2017-01-29 ENCOUNTER — Telehealth: Payer: Self-pay | Admitting: Family Medicine

## 2017-01-29 NOTE — Telephone Encounter (Signed)
Copied from [place] [id number]. Topic: General - Other >> Jan 29, 2017 10:03 AM Yvette Rack wrote: Reason for CRM: patient calling to let Dr, Carlota Raspberry know when she has had these last appointments   Colonoscopy 2017 Dr Collene Mares Bone density 2017 Mammogram 02-27-2017   12-2016 OBGYN PAP 2018   03-04-17 Dr [facility]

## 2017-02-02 NOTE — Telephone Encounter (Signed)
fyi

## 2017-02-02 NOTE — Telephone Encounter (Signed)
Noted. Health maintenance was updated.

## 2017-03-17 ENCOUNTER — Ambulatory Visit: Payer: Medicare Other | Admitting: Family Medicine

## 2017-04-02 ENCOUNTER — Ambulatory Visit: Payer: Medicare Other | Admitting: Family Medicine

## 2017-04-20 ENCOUNTER — Ambulatory Visit: Payer: Medicare Other | Admitting: Family Medicine

## 2017-04-20 ENCOUNTER — Ambulatory Visit (INDEPENDENT_AMBULATORY_CARE_PROVIDER_SITE_OTHER): Payer: Medicare Other | Admitting: Family Medicine

## 2017-04-20 ENCOUNTER — Encounter: Payer: Self-pay | Admitting: Family Medicine

## 2017-04-20 ENCOUNTER — Other Ambulatory Visit: Payer: Self-pay

## 2017-04-20 DIAGNOSIS — E785 Hyperlipidemia, unspecified: Secondary | ICD-10-CM

## 2017-04-20 DIAGNOSIS — I10 Essential (primary) hypertension: Secondary | ICD-10-CM

## 2017-04-20 LAB — LIPID PANEL
Chol/HDL Ratio: 3.8 ratio (ref 0.0–4.4)
Cholesterol, Total: 223 mg/dL — ABNORMAL HIGH (ref 100–199)
HDL: 59 mg/dL (ref >39)
LDL Calculated: 147 mg/dL — ABNORMAL HIGH (ref 0–99)
Triglycerides: 83 mg/dL (ref 0–149)
VLDL Cholesterol Cal: 17 mg/dL (ref 5–40)

## 2017-04-20 LAB — COMPREHENSIVE METABOLIC PANEL
ALT: 19 IU/L (ref 0–32)
AST: 16 IU/L (ref 0–40)
Albumin/Globulin Ratio: 1.3 (ref 1.2–2.2)
Albumin: 4.1 g/dL (ref 3.6–4.8)
Alkaline Phosphatase: 113 IU/L (ref 39–117)
BUN/Creatinine Ratio: 18 (ref 12–28)
BUN: 17 mg/dL (ref 8–27)
Bilirubin Total: 0.5 mg/dL (ref 0.0–1.2)
CO2: 26 mmol/L (ref 20–29)
Calcium: 9.8 mg/dL (ref 8.7–10.3)
Chloride: 104 mmol/L (ref 96–106)
Creatinine, Ser: 0.92 mg/dL (ref 0.57–1.00)
GFR calc Af Amer: 73 mL/min/{1.73_m2} (ref >59)
GFR calc non Af Amer: 64 mL/min/{1.73_m2} (ref >59)
Globulin, Total: 3.1 g/dL (ref 1.5–4.5)
Glucose: 85 mg/dL (ref 65–99)
Potassium: 5.1 mmol/L (ref 3.5–5.2)
Sodium: 144 mmol/L (ref 134–144)
Total Protein: 7.2 g/dL (ref 6.0–8.5)

## 2017-04-20 MED ORDER — AMLODIPINE BESYLATE 10 MG PO TABS: 10.0000 mg | ORAL_TABLET | Freq: Every day | ORAL | 1 refills | Status: DC

## 2017-04-20 MED ORDER — ATORVASTATIN CALCIUM 40 MG PO TABS: 40.0000 mg | ORAL_TABLET | Freq: Every day | ORAL | 1 refills | Status: DC

## 2017-04-20 NOTE — Progress Notes (Signed)
Subjective:  By signing my name below, I, [name], attest that this documentation has been prepared under the direction and in the presence of [name], MD Electronically Signed: [name], ED Scribe 04/20/2017 at 11:15 AM.   Patient ID: Yolanda Green, female    DOB: September 11, 1945, 72 y.o.   MRN: [id number]  Chief Complaint  Patient presents with  . Hyperlipidemia   HPI Yolanda Green is a 72 y.o. female who presents to Primary Care at [facility] for f/u of HTN, hyperlipidemia. Last seen in Jan.  HTN BP Readings from Last 3 Encounters:  04/20/17 130/70  01/17/17 (!) 148/88  04/26/16 133/83   Lab Results  Component Value Date   CREATININE 0.96 01/17/2017  She had been off meds for 2 months at last visit. Slight HA at that time. Restarted Norvasc 10 mg qd. Pt states HAs have resolved since restarting BP medications. Denies any other symptoms or side-effects. She has not been monitoring her BP outside of the office.  Hyperlipidemia Lab Results  Component Value Date   CHOL 177 04/26/2016   HDL 52 04/26/2016   LDLCALC 109 (H) 04/26/2016   TRIG 81 04/26/2016   CHOLHDL 3.4 04/26/2016   Lab Results  Component Value Date   ALT 19 01/17/2017   AST 18 01/17/2017   ALKPHOS 94 01/17/2017   BILITOT 0.3 01/17/2017  She had also been out of Lipitor at last visit but had tolerated previously. Restarted same dose 40 mg. Planned for fasting labs today. Pt is fasting at this visit. Denies any side-effects.  Feeling Down/Lonely Pt does report episodic difficulty sleeping which she attributes to her female companion being away. She is unsure when he is coming back. Denies depression, just feeling a little lonely at times. Does report that her husband passed ~51yrs ago in Sept and this is the first person she has dated since.  Patient Active Problem List   Diagnosis Date Noted  . Essential hypertension 12/23/2006  . CHICKENPOX, HX OF 12/23/2006   Past Medical History:    Diagnosis Date  . Fibroids   . H/O varicella   . History of measles, mumps, or rubella   . Hypertension   . PMB (postmenopausal bleeding) 2005  . Stenotic cervical os 07/08/03  . Stress 06/20/09  . Trichomonas 05/08/03  . Yeast infection    Past Surgical History:  Procedure Laterality Date  . DILATION AND CURETTAGE OF UTERUS  07/08/03  . HYSTEROSCOPY  07/08/03  . INGUINAL HERNIA REPAIR  1996  . TUBAL LIGATION  1979  . WISDOM TOOTH EXTRACTION     No Known Allergies Prior to Admission medications   Medication Sig Start Date End Date Taking? Authorizing Provider  amLODipine (NORVASC) 10 MG tablet Take 1 tablet (10 mg total) by mouth daily. 01/17/17   [name], MD  atorvastatin (LIPITOR) 40 MG tablet Take 1 tablet (40 mg total) by mouth daily. 01/17/17   [name], MD   Social History   Socioeconomic History  . Marital status: Married    Spouse name: Not on file  . Number of children: Not on file  . Years of education: Not on file  . Highest education level: Not on file  Occupational History  . Not on file  Social Needs  . Financial resource strain: Not on file  . Food insecurity:    Worry: Not on file    Inability: Not on file  . Transportation needs:  Medical: Not on file    Non-medical: Not on file  Tobacco Use  . Smoking status: Never Smoker  . Smokeless tobacco: Never Used  Substance and Sexual Activity  . Alcohol use: Yes    Comment: occasional glass of wine  . Drug use: No  . Sexual activity: Not on file    Comment: BTL  Lifestyle  . Physical activity:    Days per week: Not on file    Minutes per session: Not on file  . Stress: Not on file  Relationships  . Social connections:    Talks on phone: Not on file    Gets together: Not on file    Attends religious service: Not on file    Active member of club or organization: Not on file    Attends meetings of clubs or organizations: Not on file    Relationship status: Not on file  . Intimate  partner violence:    Fear of current or ex partner: Not on file    Emotionally abused: Not on file    Physically abused: Not on file    Forced sexual activity: Not on file  Other Topics Concern  . Not on file  Social History Narrative  . Not on file   Review of Systems  Constitutional: Negative for fatigue and unexpected weight change.  Respiratory: Negative for chest tightness and shortness of breath.   Cardiovascular: Negative for chest pain, palpitations and leg swelling.  Gastrointestinal: Negative for abdominal pain and blood in stool.  Neurological: Negative for dizziness, syncope, light-headedness and headaches.  Psychiatric/Behavioral: Negative for dysphoric mood.      Objective:   Physical Exam  Constitutional: She is oriented to person, place, and time. She appears well-developed and well-nourished.  HENT:  Head: Normocephalic and atraumatic.  Eyes: Pupils are equal, round, and reactive to light. Conjunctivae and EOM are normal.  Neck: Carotid bruit is not present.  Cardiovascular: Normal rate, regular rhythm, normal heart sounds and intact distal pulses.  Pulmonary/Chest: Effort normal and breath sounds normal.  Abdominal: Soft. She exhibits no pulsatile midline mass. There is no tenderness.  Neurological: She is alert and oriented to person, place, and time.  Skin: Skin is warm and dry.  Psychiatric: She has a normal mood and affect. Her behavior is normal.  Vitals reviewed.  Vitals:   04/20/17 1044  BP: 130/70  Pulse: 79  Temp: 97.8 F (36.6 C)  TempSrc: Oral  SpO2: 93%  Weight: 178 lb 3.2 oz (80.8 kg)  Height: 5\' 6"  (1.676 m)      Assessment & Plan:   Yolanda Green is a 72 y.o. female Hyperlipidemia, unspecified hyperlipidemia type - Plan: atorvastatin (LIPITOR) 40 MG tablet, Lipid panel  -Tolerating Lipitor, check fasting lab work.  Plan on recheck in 6 months at a physical.  Essential hypertension - Plan: amLODipine (NORVASC) 10 MG tablet,  Comprehensive metabolic panel  -Improved control, headaches have resolved, tolerating current dose of amlodipine.  Labs pending.  Follow-up in 6 months  Noted some difficulty sleeping last night, episodic stress with loneliness from her time away from partner.  Denies depression, advised to return to discuss further if persistent down/depression symptoms or persistent insomnia.  Understanding expressed.  Meds ordered this encounter  Medications  . atorvastatin (LIPITOR) 40 MG tablet    Sig: Take 1 tablet (40 mg total) by mouth daily.    Dispense:  90 tablet    Refill:  1  . amLODipine (NORVASC) 10 MG  tablet    Sig: Take 1 tablet (10 mg total) by mouth daily.    Dispense:  90 tablet    Refill:  1   Patient Instructions    Blood pressure looks better today. No change in blood pressure med for now.  I will check your labs but no other changes to cholesterol medicine at this time.  Please follow-up in 6 months for a physical.  If you are feeling down/depressed, or have continued difficulty with sleep, please return to discuss further.  Thank you for coming in today.     IF you received an x-ray today, you will receive an invoice from [facility]. Please contact [facility] at [phone] with questions or concerns regarding your invoice.   IF you received labwork today, you will receive an invoice from [place]. Please contact [facility] at [phone] with questions or concerns regarding your invoice.   Our billing staff will not be able to assist you with questions regarding bills from these companies.  You will be contacted with the lab results as soon as they are available. The fastest way to get your results is to activate your My Chart account. Instructions are located on the last page of this paperwork. If you have not heard from [place] regarding the results in 2 weeks, please contact this office.       I personally performed the services described in this  documentation, which was scribed in my presence. The recorded information has been reviewed and considered for accuracy and completeness, addended by me as needed, and agree with information above.  Signed,   Merri Ray, MD Primary Care at [place].  04/20/17 11:24 AM

## 2017-04-20 NOTE — Patient Instructions (Addendum)
  Blood pressure looks better today. No change in blood pressure med for now.  I will check your labs but no other changes to cholesterol medicine at this time.  Please follow-up in 6 months for a physical.  If you are feeling down/depressed, or have continued difficulty with sleep, please return to discuss further.  Thank you for coming in today.     IF you received an x-ray today, you will receive an invoice from [facility]. Please contact [facility] at [phone] with questions or concerns regarding your invoice.   IF you received labwork today, you will receive an invoice from [place]. Please contact [facility] at [phone] with questions or concerns regarding your invoice.   Our billing staff will not be able to assist you with questions regarding bills from these companies.  You will be contacted with the lab results as soon as they are available. The fastest way to get your results is to activate your My Chart account. Instructions are located on the last page of this paperwork. If you have not heard from [place] regarding the results in 2 weeks, please contact this office.

## 2017-04-28 ENCOUNTER — Encounter: Payer: Self-pay | Admitting: Radiology

## 2017-05-08 ENCOUNTER — Telehealth: Payer: Self-pay

## 2017-05-08 NOTE — Telephone Encounter (Signed)
Called pt. Per pt she was not taking cholesterol medication as she should so she does not believe there is a need for an increase at this time. She wanted to send her apologies to Dr. Carlota Raspberry and to let him know that she is going to do better.   Copied from [place] [id number]. Topic: Quick Communication - See Telephone Encounter >> May 07, 2017  3:02 PM Corie Chiquito, [place] wrote: CRM for notification. Patient calling because she would like to speak with Dr.Green about her lab results. She can be reached at [phone]

## 2017-05-13 NOTE — Telephone Encounter (Signed)
Noted. Thanks.

## 2017-09-23 ENCOUNTER — Ambulatory Visit (INDEPENDENT_AMBULATORY_CARE_PROVIDER_SITE_OTHER): Payer: Medicare Other | Admitting: Family Medicine

## 2017-09-23 ENCOUNTER — Other Ambulatory Visit: Payer: Self-pay

## 2017-09-23 ENCOUNTER — Encounter: Payer: Self-pay | Admitting: Family Medicine

## 2017-09-23 VITALS — BP 138/78 | HR 95 | Temp 98.5°F | Ht 66.0 in | Wt 178.2 lb

## 2017-09-23 DIAGNOSIS — M79601 Pain in right arm: Secondary | ICD-10-CM

## 2017-09-23 DIAGNOSIS — M791 Myalgia, unspecified site: Secondary | ICD-10-CM | POA: Diagnosis not present

## 2017-09-23 NOTE — Patient Instructions (Addendum)
   Your arm symptoms appear to be delayed onset muscle soreness, and reassuring that it is improving today.  Less likely cause would be muscle breakdown or injury to the muscle but as improving can wait on blood test at this time.  If you are not continuing to improve tonight and tomorrow, follow-up with me first thing Friday morning and we can repeat your exam and check that blood test.  Make sure to drink plenty of fluids, Tylenol as needed.    Return to the clinic or go to the nearest emergency room if any of your symptoms worsen or new symptoms occur.   If you have lab work done today you will be contacted with your lab results within the next 2 weeks.  If you have not heard from [place] then please contact us. The fastest way to get your results is to register for My Chart.   IF you received an x-ray today, you will receive an invoice from [facility]. Please contact [facility] at [phone] with questions or concerns regarding your invoice.   IF you received labwork today, you will receive an invoice from [place]. Please contact [facility] at [phone] with questions or concerns regarding your invoice.   Our billing staff will not be able to assist you with questions regarding bills from these companies.  You will be contacted with the lab results as soon as they are available. The fastest way to get your results is to activate your My Chart account. Instructions are located on the last page of this paperwork. If you have not heard from [place] regarding the results in 2 weeks, please contact this office.

## 2017-09-23 NOTE — Progress Notes (Signed)
Subjective:  By signing my name below, I, Yolanda Green, attest that this documentation has been prepared under the direction and in the presence of Yolanda Agreste, MD Electronically Signed: [name], ED Scribe 09/23/2017 at 5:15 PM.   Patient ID: Yolanda Green, female    DOB: 1945-06-24, 72 y.o.   MRN: [id number]  Chief Complaint  Patient presents with  . right arm pain    last night it hurts so much she could not sleep. Today it is more sore   HPI Yolanda Green is a 72 y.o. female who presents to Primary Care at [facility] complaining of R upper arm pain radiating to R shoulder since last night, improved some today. Pt states she was doing a lap pull down with 80 lbs weight 2 days ago on both sides and suspects that she lifted too much too soon. Reports soreness that began at work yesterday, worse last night. States pain kept her up all night but reports some improvement today. Denies changes in urine color, neck pain.  Patient Active Problem List   Diagnosis Date Noted  . Essential hypertension 12/23/2006  . CHICKENPOX, HX OF 12/23/2006   Past Medical History:  Diagnosis Date  . Fibroids   . H/O varicella   . History of measles, mumps, or rubella   . Hypertension   . PMB (postmenopausal bleeding) 2005  . Stenotic cervical os 07/08/03  . Stress 06/20/09  . Trichomonas 05/08/03  . Yeast infection    Past Surgical History:  Procedure Laterality Date  . DILATION AND CURETTAGE OF UTERUS  07/08/03  . HYSTEROSCOPY  07/08/03  . INGUINAL HERNIA REPAIR  1996  . TUBAL LIGATION  1979  . WISDOM TOOTH EXTRACTION     No Known Allergies Prior to Admission medications   Medication Sig Start Date End Date Taking? Authorizing Provider  amLODipine (NORVASC) 10 MG tablet Take 1 tablet (10 mg total) by mouth daily. 04/20/17   Yolanda Agreste, MD  atorvastatin (LIPITOR) 40 MG tablet Take 1 tablet (40 mg total) by mouth daily. 04/20/17   Yolanda Agreste, MD   Social History    Socioeconomic History  . Marital status: Married    Spouse name: Not on file  . Number of children: Not on file  . Years of education: Not on file  . Highest education level: Not on file  Occupational History  . Not on file  Social Needs  . Financial resource strain: Not on file  . Food insecurity:    Worry: Not on file    Inability: Not on file  . Transportation needs:    Medical: Not on file    Non-medical: Not on file  Tobacco Use  . Smoking status: Never Smoker  . Smokeless tobacco: Never Used  Substance and Sexual Activity  . Alcohol use: Yes    Comment: occasional glass of wine  . Drug use: No  . Sexual activity: Not on file    Comment: BTL  Lifestyle  . Physical activity:    Days per week: Not on file    Minutes per session: Not on file  . Stress: Not on file  Relationships  . Social connections:    Talks on phone: Not on file    Gets together: Not on file    Attends religious service: Not on file    Active member of club or organization: Not on file    Attends meetings of clubs or organizations: Not on file  Relationship status: Not on file  . Intimate partner violence:    Fear of current or ex partner: Not on file    Emotionally abused: Not on file    Physically abused: Not on file    Forced sexual activity: Not on file  Other Topics Concern  . Not on file  Social History Narrative  . Not on file   Review of Systems  Musculoskeletal: Positive for arthralgias and myalgias. Negative for neck pain.      Objective:   Physical Exam  Constitutional: She is oriented to person, place, and time. She appears well-developed and well-nourished. No distress.  HENT:  Head: Normocephalic and atraumatic.  Eyes: Conjunctivae and EOM are normal.  Neck: Neck supple. No tracheal deviation present.  Cardiovascular: Normal rate.  Pulmonary/Chest: Effort normal. No respiratory distress.  Musculoskeletal:  R elbow: Pain free ROM. No bony tenderness. NVI distally.  [place], clavicle, AC nontender. Trapezius is nontender. R UE: Complains of pain diffusely in R upper arm, distally to shoulder with arm elevation. Pain free bicep, tricep resistance. Some discomfort in UE with abduction. FRTC and pain free. Equal ROM with internal rotation. No bony tenderness.  Neurological: She is alert and oriented to person, place, and time.  Skin: Skin is warm and dry.  Psychiatric: She has a normal mood and affect. Her behavior is normal.  Nursing note and vitals reviewed.  Vitals:   09/23/17 1641 09/23/17 1644  BP: (!) 168/70 138/78  Pulse: 95   Temp: 98.5 F (36.9 C)   TempSrc: Oral   SpO2: 95%   Weight: 178 lb 3.2 oz (80.8 kg)   Height: 5\' 6"  (1.676 m)       Assessment & Plan:  Yolanda Green is a 72 y.o. female Right arm pain - Plan: CANCELED: CK  Myalgia - Plan: CANCELED: CK   -Suspected overuse, delayed onset muscle soreness.  Initially discussed CPK to rule out rhabdomyolysis, but as symptoms are improving she decided she would like to defer blood testing at this time.  RTC precautions if not continuing to improve in the next 24 to 48 hours and at that time would check CPK.  RTC/ER precautions given to be seen sooner if worse.  No orders of the defined types were placed in this encounter.  Patient Instructions     Your arm symptoms appear to be delayed onset muscle soreness, and reassuring that it is improving today.  Less likely cause would be muscle breakdown or injury to the muscle but as improving can wait on blood test at this time.  If you are not continuing to improve tonight and tomorrow, follow-up with me first thing Friday morning and we can repeat your exam and check that blood test.  Make sure to drink plenty of fluids, Tylenol as needed.    Return to the clinic or go to the nearest emergency room if any of your symptoms worsen or new symptoms occur.   If you have lab work done today you will be contacted with your lab results within the  next 2 weeks.  If you have not heard from [place] then please contact us. The fastest way to get your results is to register for My Chart.   IF you received an x-ray today, you will receive an invoice from [facility]. Please contact [facility] at [phone] with questions or concerns regarding your invoice.   IF you received labwork today, you will receive an invoice from [employer] [facility]. Please contact [facility]  at [phone] with questions or concerns regarding your invoice.   Our billing staff will not be able to assist you with questions regarding bills from these companies.  You will be contacted with the lab results as soon as they are available. The fastest way to get your results is to activate your My Chart account. Instructions are located on the last page of this paperwork. If you have not heard from [place] regarding the results in 2 weeks, please contact this office.       I personally performed the services described in this documentation, which was scribed in my presence. The recorded information has been reviewed and considered for accuracy and completeness, addended by me as needed, and agree with information above.  Signed,   Merri Ray, MD Primary [facility] at [place].  09/24/17 10:15 PM

## 2017-10-19 ENCOUNTER — Ambulatory Visit: Payer: Medicare Other | Admitting: Family Medicine

## 2017-11-16 ENCOUNTER — Other Ambulatory Visit: Payer: Self-pay

## 2017-11-16 ENCOUNTER — Emergency Department (HOSPITAL_BASED_OUTPATIENT_CLINIC_OR_DEPARTMENT_OTHER)
Admission: EM | Admit: 2017-11-16 | Discharge: 2017-11-16 | Disposition: A | Discharge status: Home / Self Care | Payer: Medicare Other | Attending: Emergency Medicine | Admitting: Emergency Medicine

## 2017-11-16 ENCOUNTER — Emergency Department (HOSPITAL_BASED_OUTPATIENT_CLINIC_OR_DEPARTMENT_OTHER): Payer: Medicare Other

## 2017-11-16 ENCOUNTER — Encounter (HOSPITAL_BASED_OUTPATIENT_CLINIC_OR_DEPARTMENT_OTHER): Payer: Self-pay

## 2017-11-16 DIAGNOSIS — R079 Chest pain, unspecified: Secondary | ICD-10-CM | POA: Diagnosis not present

## 2017-11-16 DIAGNOSIS — I10 Essential (primary) hypertension: Secondary | ICD-10-CM | POA: Insufficient documentation

## 2017-11-16 DIAGNOSIS — Z79899 Other long term (current) drug therapy: Secondary | ICD-10-CM | POA: Diagnosis not present

## 2017-11-16 DIAGNOSIS — R0789 Other chest pain: Secondary | ICD-10-CM | POA: Diagnosis not present

## 2017-11-16 LAB — COMPREHENSIVE METABOLIC PANEL
ALT: 20 U/L (ref 0–44)
AST: 19 U/L (ref 15–41)
Albumin: 3.8 g/dL (ref 3.5–5.0)
Alkaline Phosphatase: 82 U/L (ref 38–126)
Anion gap: 9 (ref 5–15)
BUN: 15 mg/dL (ref 8–23)
CO2: 26 mmol/L (ref 22–32)
Calcium: 9.4 mg/dL (ref 8.9–10.3)
Chloride: 103 mmol/L (ref 98–111)
Creatinine, Ser: 0.82 mg/dL (ref 0.44–1.00)
GFR calc Af Amer: >60 mL/min (ref >60)
GFR calc non Af Amer: >60 mL/min (ref >60)
Glucose, Bld: 93 mg/dL (ref 70–99)
Potassium: 4.1 mmol/L (ref 3.5–5.1)
Sodium: 138 mmol/L (ref 135–145)
Total Bilirubin: 0.8 mg/dL (ref 0.3–1.2)
Total Protein: 7.4 g/dL (ref 6.5–8.1)

## 2017-11-16 LAB — CBC WITH DIFFERENTIAL/PLATELET
Abs Immature Granulocytes: 0.02 10*3/uL (ref 0.00–0.07)
Basophils Absolute: 0 10*3/uL (ref 0.0–0.1)
Basophils Relative: 0 %
Eosinophils Absolute: 0.2 10*3/uL (ref 0.0–0.5)
Eosinophils Relative: 2 %
HCT: 45.2 % (ref 36.0–46.0)
Hemoglobin: 14.9 g/dL (ref 12.0–15.0)
Immature Granulocytes: 0 %
Lymphocytes Relative: 35 %
Lymphs Abs: 3.2 10*3/uL (ref 0.7–4.0)
MCH: 31.9 pg (ref 26.0–34.0)
MCHC: 33 g/dL (ref 30.0–36.0)
MCV: 96.8 fL (ref 80.0–100.0)
Monocytes Absolute: 0.5 10*3/uL (ref 0.1–1.0)
Monocytes Relative: 6 %
Neutro Abs: 5.2 10*3/uL (ref 1.7–7.7)
Neutrophils Relative %: 57 %
Platelets: 210 10*3/uL (ref 150–400)
RBC: 4.67 MIL/uL (ref 3.87–5.11)
RDW: 12.6 % (ref 11.5–15.5)
WBC: 9.1 10*3/uL (ref 4.0–10.5)
nRBC: 0 % (ref 0.0–0.2)

## 2017-11-16 LAB — TROPONIN I
Troponin I: <0.03 ng/mL (ref <0.03)
Troponin I: <0.03 ng/mL (ref <0.03)

## 2017-11-16 MED ORDER — ASPIRIN 81 MG PO CHEW
324.0000 mg | CHEWABLE_TABLET | Freq: Once | ORAL | Status: AC
  Administered 2017-11-16: 324 mg via ORAL
  Filled 2017-11-16: qty 4

## 2017-11-16 NOTE — ED Triage Notes (Signed)
Pt states she woke up Friday night and noticed tingling in her R arm. Sensation became better throughout the day. Tingling has returned each night before bed. Pt stating today she is having some intermittent heaviness on the R side of her chest. Denies CP at the present.

## 2017-11-16 NOTE — Discharge Instructions (Addendum)
Return to ED if have chest pain.

## 2017-11-16 NOTE — ED Notes (Signed)
ED Provider at bedside. 

## 2017-11-16 NOTE — ED Provider Notes (Signed)
[facility] EMERGENCY DEPARTMENT Provider Note   CSN: [id number] Arrival date & time: 11/16/17  1753     History   Chief Complaint Chief Complaint  Patient presents with  . Chest Pain    HPI Yolanda Green is a 72 y.o. female.  The history is provided by the patient.  Chest Pain   This is a new problem. The current episode started 12 to 24 hours ago. The problem occurs rarely. The problem has been resolved. The pain is associated with rest. The pain is present in the lateral region. The pain is at a severity of 2/10. The pain is mild. The quality of the pain is described as brief and pressure-like. The pain does not radiate. Associated symptoms include numbness (tip of right middle finger). Pertinent negatives include no abdominal pain, no back pain, no claudication, no cough, no dizziness, no exertional chest pressure, no fever, no headaches, no irregular heartbeat, no leg pain, no malaise/fatigue, no nausea, no palpitations, no PND, no shortness of breath, no syncope and no vomiting. She has tried nothing for the symptoms. The treatment provided no relief.  Her past medical history is significant for hyperlipidemia and hypertension.  Pertinent negatives for past medical history include no PE and no seizures.  Pertinent negatives for family medical history include: no CAD.  Procedure history is negative for cardiac catheterization and echocardiogram.    Past Medical History:  Diagnosis Date  . Fibroids   . H/O varicella   . History of measles, mumps, or rubella   . Hypertension   . PMB (postmenopausal bleeding) 2005  . Stenotic cervical os 07/08/03  . Stress 06/20/09  . Trichomonas 05/08/03  . Yeast infection     Patient Active Problem List   Diagnosis Date Noted  . Essential hypertension 12/23/2006  . CHICKENPOX, HX OF 12/23/2006    Past Surgical History:  Procedure Laterality Date  . DILATION AND CURETTAGE OF UTERUS  07/08/03  . HYSTEROSCOPY  07/08/03  .  INGUINAL HERNIA REPAIR  1996  . TUBAL LIGATION  1979  . WISDOM TOOTH EXTRACTION       OB History    Gravida  2   Para  2   Term  2   Preterm      AB      Living  2     SAB      TAB      Ectopic      Multiple      Live Births  2            Home Medications    Prior to Admission medications   Medication Sig Start Date End Date Taking? Authorizing Provider  amLODipine (NORVASC) 10 MG tablet Take 1 tablet (10 mg total) by mouth daily. 04/20/17   [name], MD  atorvastatin (LIPITOR) 40 MG tablet Take 1 tablet (40 mg total) by mouth daily. 04/20/17   [name], MD    Family History Family History  Problem Relation Age of Onset  . Cancer Father        Prostate  . Diabetes Sister   . Hearing loss Son   . Cancer Paternal Aunt        3 aunts with breast cancer    Social History Social History   Tobacco Use  . Smoking status: Never Smoker  . Smokeless tobacco: Never Used  Substance Use Topics  . Alcohol use: Yes    Comment: occasional glass of  wine  . Drug use: No     Allergies   Patient has no known allergies.   Review of Systems Review of Systems  Constitutional: Negative for chills, fever and malaise/fatigue.  HENT: Negative for ear pain and sore throat.   Eyes: Negative for pain and visual disturbance.  Respiratory: Negative for cough and shortness of breath.   Cardiovascular: Positive for chest pain. Negative for palpitations, claudication, syncope and PND.  Gastrointestinal: Negative for abdominal pain, nausea and vomiting.  Genitourinary: Negative for dysuria and hematuria.  Musculoskeletal: Negative for arthralgias and back pain.  Skin: Negative for color change and rash.  Neurological: Positive for numbness (tip of right middle finger). Negative for dizziness, seizures, syncope and headaches.  All other systems reviewed and are negative.    Physical Exam Updated Vital Signs BP 129/75   Pulse 74   Temp 98.1 F (36.7  C) (Oral)   Resp 17   Ht 5\' 6"  (1.676 m)   Wt 80.3 kg   SpO2 92%   BMI 28.57 kg/m   Physical Exam  Constitutional: She is oriented to person, place, and time. She appears well-developed and well-nourished. No distress.  HENT:  Head: Normocephalic and atraumatic.  Eyes: Pupils are equal, round, and reactive to light. Conjunctivae and EOM are normal.  Neck: Normal range of motion. Neck supple.  Cardiovascular: Normal rate, regular rhythm, intact distal pulses and normal pulses.  No murmur heard. Pulmonary/Chest: Effort normal and breath sounds normal. No respiratory distress. She has no decreased breath sounds. She has no wheezes. She has no rhonchi. She has no rales.  Abdominal: Soft. There is no tenderness.  Musculoskeletal: She exhibits no edema.       Right lower leg: She exhibits no edema.       Left lower leg: She exhibits no edema.  Neurological: She is alert and oriented to person, place, and time.  5+ out of 5 strength, normal sensation, no drift, normal finger-to-nose finger  Skin: Skin is warm and dry.  Psychiatric: She has a normal mood and affect.  Nursing note and vitals reviewed.    ED Treatments / Results  Labs (all labs ordered are listed, but only abnormal results are displayed) Labs Reviewed  TROPONIN I  TROPONIN I  COMPREHENSIVE METABOLIC PANEL  CBC WITH DIFFERENTIAL/PLATELET    EKG EKG Interpretation  Date/Time:  Monday November 16 2017 18:03:10 EST Ventricular Rate:  93 PR Interval:  176 QRS Duration: 80 QT Interval:  342 QTC Calculation: 425 R Axis:   38 Text Interpretation:  Normal sinus rhythm Cannot rule out Anterior infarct , age undetermined Abnormal ECG Confirmed by Lennice Sites [phone]) on 11/16/2017 7:01:41 PM   Radiology Dg Chest Portable 1 View  Result Date: 11/16/2017 CLINICAL DATA:  Three-day history of intermittent RIGHT UPPER extremity tingling and acute onset of RIGHT-sided chest pain and heaviness earlier today. Current  history of hypertension. EXAM: PORTABLE CHEST 1 VIEW COMPARISON:  None. FINDINGS: Suboptimal inspiration accounts for crowded bronchovascular markings diffusely and atelectasis in the bases, and accentuates the cardiac silhouette. Taking this into account, cardiac silhouette upper normal in size. Lungs otherwise clear. Pulmonary vascularity normal without evidence of pulmonary edema. No visible pleural effusions. IMPRESSION: Suboptimal inspiration which accounts for bibasilar atelectasis. No acute cardiopulmonary disease otherwise. Electronically Signed   By: Evangeline Dakin M.D.   On: 11/16/2017 19:34    Procedures Procedures (including critical care time)  Medications Ordered in ED Medications  aspirin chewable tablet 324 mg (324  mg Oral Given 11/16/17 1923)     Initial Impression / Assessment and Plan / ED Course  I have reviewed the triage vital signs and the nursing notes.  Pertinent labs & imaging results that were available during my care of the patient were reviewed by me and considered in my medical decision making (see chart for details).     Yolanda Green is a 72 year old female history of hypertension, high cholesterol who presents to the ED with chest pain.  Patient with normal vitals.  No fever.  Patient with right-sided chest pain that lasted several minutes earlier this morning.  Pain is now resolved.  Patient mostly concerned about some pain and numbness in the distal tip of her right middle finger.  Patient states that she has been doing a lot of writing recently using her right hand.  She denies any other trauma or repetitive use.  Patient currently asymptomatic.  Normal neurological exam.  Suspect likely peripheral neuropathy.  No concern for stroke.  Patient had EKG that showed sinus rhythm.  No signs of ischemic changes.  Patient with overall unremarkable exam.  Clear breath sounds.  No signs of pneumonia, pneumothorax, pleural effusion on chest x-ray.  Patient with  serial troponins within normal limits.  Patient with no significant anemia, electrolyte abnormality, kidney injury.  Patient is Wells criteria 0 and doubt PE.  Patient with history and physical not consistent with dissection.  Discussed with patient about her chest pain and my concern for possible ACS.  However, patient would like to follow-up outpatient with primary care doctor.  Chest pain was atypical in nature however she does have some cardiac risk factors and given her age would benefit from inpatient stay with troponins and possible stress test.  However she has capacity to decline admission and would rather follow-up with primary physician.  Recommend that she return to the ED if she has another episode of chest pain and she understands the risks and benefits of staying vs leaving and elects to leave.  She was discharged from ED in good condition.  This chart was dictated using voice recognition software.  Despite best efforts to proofread,  errors can occur which can change the documentation meaning.   Final Clinical Impressions(s) / ED Diagnoses   Final diagnoses:  Chest pain, unspecified type    ED Discharge Orders    None       Lennice Sites, DO 11/16/17 2308

## 2017-11-20 ENCOUNTER — Other Ambulatory Visit: Payer: Self-pay | Admitting: Family Medicine

## 2017-11-20 ENCOUNTER — Ambulatory Visit: Payer: Medicare Other | Admitting: Family Medicine

## 2017-11-20 DIAGNOSIS — I10 Essential (primary) hypertension: Secondary | ICD-10-CM

## 2017-11-20 NOTE — Telephone Encounter (Signed)
courtesy refill

## 2017-11-23 ENCOUNTER — Other Ambulatory Visit: Payer: Self-pay

## 2017-11-23 ENCOUNTER — Encounter

## 2017-11-23 ENCOUNTER — Encounter: Payer: Self-pay | Admitting: Family Medicine

## 2017-11-23 ENCOUNTER — Ambulatory Visit (INDEPENDENT_AMBULATORY_CARE_PROVIDER_SITE_OTHER): Payer: Medicare Other | Admitting: Family Medicine

## 2017-11-23 VITALS — BP 124/80 | HR 76 | Temp 98.6°F | Ht 66.0 in | Wt 179.0 lb

## 2017-11-23 DIAGNOSIS — G5621 Lesion of ulnar nerve, right upper limb: Secondary | ICD-10-CM

## 2017-11-23 DIAGNOSIS — I10 Essential (primary) hypertension: Secondary | ICD-10-CM | POA: Diagnosis not present

## 2017-11-23 NOTE — Patient Instructions (Signed)
° ° ° °  If you have lab work done today you will be contacted with your lab results within the next 2 weeks.  If you have not heard from us then please contact us. The fastest way to get your results is to register for My Chart. ° ° °IF you received an x-ray today, you will receive an invoice from [facility]. Please contact [facility] at [phone] with questions or concerns regarding your invoice.  ° °IF you received labwork today, you will receive an invoice from [facility]. Please contact [facility] at [phone] with questions or concerns regarding your invoice.  ° °Our billing staff will not be able to assist you with questions regarding bills from these companies. ° °You will be contacted with the lab results as soon as they are available. The fastest way to get your results is to activate your My Chart account. Instructions are located on the last page of this paperwork. If you have not heard from us regarding the results in 2 weeks, please contact this office. °  ° ° ° °

## 2017-11-23 NOTE — Progress Notes (Signed)
11/11/20192:45 PM  Yolanda Green [birth year], 72 y.o. female [id number]  Chief Complaint  Patient presents with  . Numbness    follow up from ER visit. Really concerned about the numbness in the right hand. Soreness happens more so at night. No longer having any discomfort on the right side of th chest    HPI:   Patient is a 72 y.o. female with past medical history significant for HLP and HTN who presents today for ER followup  PCP Dr Carlota Raspberry Seen in ED on 11/16/17 - neg chest pain workup About a week and half ago felt a non painful sensation that started in her right arm went down to her hand, 3rd and 4th fingers, sensitive, tingling, no numbness, fingers felt sore, could not comb her hair, etc.  As day progressed all sx resolved  Then it continued to come back every night 3rd and 4th fingers would become sore, tingling  However then it continued with some mild right upper chest tightness so went to ER  This is the first time she ever happened  She spends long hours writing by hand, right handed Denies any neck pain Denies any grip weakness Handing her arm over the bed helps a lot   Fall Risk  11/23/2017 09/23/2017 04/20/2017 01/17/2017 04/26/2016  Falls in the past year? 0 No No No No     Depression screen [facility] 2/9 09/23/2017 04/20/2017 01/17/2017  Decreased Interest 0 0 0  Down, Depressed, Hopeless 0 0 0  PHQ - 2 Score 0 0 0    No Known Allergies  Prior to Admission medications   Medication Sig Start Date End Date Taking? Authorizing Provider  amLODipine (NORVASC) 10 MG tablet TAKE 1 TABLET BY MOUTH EVERY DAY 11/20/17  Yes [name], MD  atorvastatin (LIPITOR) 40 MG tablet Take 1 tablet (40 mg total) by mouth daily. 04/20/17  Yes [name], MD    Past Medical History:  Diagnosis Date  . Fibroids   . H/O varicella   . History of measles, mumps, or rubella   . Hypertension   . PMB (postmenopausal bleeding) 2005  . Stenotic cervical os 07/08/03  .  Stress 06/20/09  . Trichomonas 05/08/03  . Yeast infection     Past Surgical History:  Procedure Laterality Date  . DILATION AND CURETTAGE OF UTERUS  07/08/03  . HYSTEROSCOPY  07/08/03  . INGUINAL HERNIA REPAIR  1996  . TUBAL LIGATION  1979  . WISDOM TOOTH EXTRACTION      Social History   Tobacco Use  . Smoking status: Never Smoker  . Smokeless tobacco: Never Used  Substance Use Topics  . Alcohol use: Yes    Comment: occasional glass of wine    Family History  Problem Relation Age of Onset  . Cancer Father        Prostate  . Diabetes Sister   . Hearing loss Son   . Cancer Paternal Aunt        3 aunts with breast cancer    ROS Per hpi  OBJECTIVE:  Blood pressure 124/80, pulse 76, temperature 98.6 F (37 C), temperature source Oral, height 5\' 6"  (1.676 m), weight 179 lb (81.2 kg), SpO2 94 %. Body mass index is 28.89 kg/m.   Physical Exam  Constitutional: She is oriented to person, place, and time. She appears well-developed and well-nourished.  HENT:  Head: Normocephalic and atraumatic.  Mouth/Throat: Oropharynx is clear and moist. No oropharyngeal exudate.  Eyes: Pupils  are equal, round, and reactive to light. Conjunctivae and EOM are normal. No scleral icterus.  Neck: Neck supple.  Cardiovascular: Normal rate, regular rhythm and normal heart sounds. Exam reveals no gallop and no friction rub.  No murmur heard. Pulmonary/Chest: Effort normal and breath sounds normal. She has no wheezes. She has no rales.  Musculoskeletal: She exhibits no edema.  BUE normal strength, normal grip, normal reflexes Neg tinel and phalen + RUE elbow flexion test, normal left elbow Neg B elbow tinel Neck non tender, neg FROM, neg spurlings  Neurological: She is alert and oriented to person, place, and time.  Skin: Skin is warm and dry.  Psychiatric: She has a normal mood and affect.  Nursing note and vitals reviewed.    ASSESSMENT and PLAN  1. Ulnar neuropathy of right upper  extremity Discussed supportive measures. Patient declined medications at this time as symptoms are minimal at this point. Patient educational handout given.  2. Essential hypertension Controlled. Continue current regime.  - Care order/instruction:   Return for Dr Carlota Raspberry.    Rutherford Guys, MD Primary Care at [place], [place] [address] Ph.  [phone] Fax [phone]

## 2017-12-18 ENCOUNTER — Encounter: Payer: Self-pay | Admitting: Family Medicine

## 2017-12-18 ENCOUNTER — Other Ambulatory Visit: Payer: Self-pay

## 2017-12-18 ENCOUNTER — Ambulatory Visit (INDEPENDENT_AMBULATORY_CARE_PROVIDER_SITE_OTHER): Payer: Medicare Other | Admitting: Family Medicine

## 2017-12-18 VITALS — BP 128/76 | HR 66 | Temp 97.7°F | Resp 18 | Ht 64.33 in | Wt 174.2 lb

## 2017-12-18 DIAGNOSIS — I10 Essential (primary) hypertension: Secondary | ICD-10-CM | POA: Diagnosis not present

## 2017-12-18 DIAGNOSIS — M79641 Pain in right hand: Secondary | ICD-10-CM

## 2017-12-18 DIAGNOSIS — E785 Hyperlipidemia, unspecified: Secondary | ICD-10-CM

## 2017-12-18 DIAGNOSIS — Z87898 Personal history of other specified conditions: Secondary | ICD-10-CM | POA: Diagnosis not present

## 2017-12-18 MED ORDER — AMLODIPINE BESYLATE 10 MG PO TABS: 10.0000 mg | ORAL_TABLET | Freq: Every day | ORAL | 1 refills | Status: DC

## 2017-12-18 MED ORDER — ATORVASTATIN CALCIUM 40 MG PO TABS: 40.0000 mg | ORAL_TABLET | Freq: Every day | ORAL | 1 refills | Status: DC

## 2017-12-18 NOTE — Progress Notes (Signed)
Subjective:    Patient ID: Yolanda Green, female    DOB: 12/18/1945, 72 y.o.   MRN: [id number]  HPI Yolanda Green is a 72 y.o. female Presents today for: Chief Complaint  Patient presents with  . Hypertension    f/u pt would like to check BP and cholesterol  . Hand Pain    f/u- on right hand pain    Hypertension: BP Readings from Last 3 Encounters:  12/18/17 128/76  11/23/17 124/80  11/16/17 129/75   Lab Results  Component Value Date   CREATININE 0.82 11/16/2017  Takes Norvasc 10 mg daily  Hyperlipidemia:  Lab Results  Component Value Date   CHOL 223 (H) 04/20/2017   HDL 59 04/20/2017   LDLCALC 147 (H) 04/20/2017   TRIG 83 04/20/2017   CHOLHDL 3.8 04/20/2017   Lab Results  Component Value Date   ALT 20 11/16/2017   AST 19 11/16/2017   ALKPHOS 82 11/16/2017   BILITOT 0.8 11/16/2017  Takes Lipitor 40 mg daily. Had been missing some doses of lipitor.  Has also missed doses recently.  Takes 3-4 days per week on average. Afraid of possible side effects, but not having side effects.  The 10-year ASCVD risk score [name], et al., 2013) is: 13.3%   Values used to calculate the score:     Age: 63 years     Sex: Female     Is Non-Hispanic African American: Yes     Diabetic: No     Tobacco smoker: No     Systolic Blood Pressure: 245 mmHg     Is BP treated: Yes     HDL Cholesterol: 59 mg/dL     Total Cholesterol: 223 mg/dL   Right arm/hand numbness. Seen November 11 by Dr. Pamella Pert.  Third and fourth fingers with soreness and tingling, numbness in the right arm down to the hand third and fourth fingers at that visit.  Noticed symptoms at night.  Denies any neck pain or grip weakness at that time.  Thought to have ulnar neuropathy.  Supportive measures discussed  Symptoms have improved. Has tried some exercises. Sensitive to 3rd-4th finger. Notices with bending 3rd-4th fingers. Sensitive in those fingertips at times, but better. Hurts to use at times or  with writing. Sx's greater than a month. Better, but not resolved and would like to meet with hand specialist.   History of chest pain, right-sided, seen in the ER November 4.  Denies recurrence of chest pain or shortness of breath, but is been recommended to follow-up with cardiology outpatient.  She would like to follow-up at this time although no new symptoms.  On review of ER note she did have resolution of pain in the ER, suspected possible peripheral neuropathy symptoms as right-sided.  EKG was sinus rhythm and no sign of ischemic changes.  Wells criteria of 0.  Given some of her cardiac risk factors and age had discussed inpatient stay with troponins and possible stress test, but she had declined admission and planned outpatient follow-up.  Troponins in ER were negative.   Patient Active Problem List   Diagnosis Date Noted  . Essential hypertension 12/23/2006  . CHICKENPOX, HX OF 12/23/2006   Past Medical History:  Diagnosis Date  . Fibroids   . H/O varicella   . History of measles, mumps, or rubella   . Hypertension   . PMB (postmenopausal bleeding) 2005  . Stenotic cervical os 07/08/03  . Stress 06/20/09  . Trichomonas 05/08/03  .  Yeast infection    Past Surgical History:  Procedure Laterality Date  . DILATION AND CURETTAGE OF UTERUS  07/08/03  . HYSTEROSCOPY  07/08/03  . INGUINAL HERNIA REPAIR  1996  . TUBAL LIGATION  1979  . WISDOM TOOTH EXTRACTION     No Known Allergies Prior to Admission medications   Medication Sig Start Date End Date Taking? Authorizing Provider  amLODipine (NORVASC) 10 MG tablet TAKE 1 TABLET BY MOUTH EVERY DAY 11/20/17  Yes [name], MD  atorvastatin (LIPITOR) 40 MG tablet Take 1 tablet (40 mg total) by mouth daily. 04/20/17  Yes [name], MD   Social History   Socioeconomic History  . Marital status: Widowed    Spouse name: Not on file  . Number of children: 2  . Years of education: Not on file  . Highest education level: Not on  file  Occupational History  . Not on file  Social Needs  . Financial resource strain: Not on file  . Food insecurity:    Worry: Not on file    Inability: Not on file  . Transportation needs:    Medical: Not on file    Non-medical: Not on file  Tobacco Use  . Smoking status: Never Smoker  . Smokeless tobacco: Never Used  Substance and Sexual Activity  . Alcohol use: Yes    Comment: occasional glass of wine  . Drug use: No  . Sexual activity: Not Currently    Birth control/protection: Surgical, Post-menopausal    Comment: BTL  Lifestyle  . Physical activity:    Days per week: Not on file    Minutes per session: Not on file  . Stress: Not on file  Relationships  . Social connections:    Talks on phone: Not on file    Gets together: Not on file    Attends religious service: Not on file    Active member of club or organization: Not on file    Attends meetings of clubs or organizations: Not on file    Relationship status: Not on file  . Intimate partner violence:    Fear of current or ex partner: Not on file    Emotionally abused: Not on file    Physically abused: Not on file    Forced sexual activity: Not on file  Other Topics Concern  . Not on file  Social History Narrative  . Not on file    Review of Systems  Constitutional: Negative for fatigue and unexpected weight change.  Respiratory: Negative for chest tightness and shortness of breath.   Cardiovascular: Negative for chest pain, palpitations and leg swelling.  Gastrointestinal: Negative for abdominal pain and blood in stool.  Neurological: Positive for numbness (with hand pain in fingers at times. ). Negative for dizziness, syncope, light-headedness and headaches.       Objective:   Physical Exam  Constitutional: She is oriented to person, place, and time. She appears well-developed and well-nourished.  HENT:  Head: Normocephalic and atraumatic.  Eyes: Pupils are equal, round, and reactive to light.  Conjunctivae and EOM are normal.  Neck: Carotid bruit is not present.  Cardiovascular: Normal rate, regular rhythm, normal heart sounds and intact distal pulses.  Pulmonary/Chest: Effort normal and breath sounds normal.  Abdominal: Soft. She exhibits no pulsatile midline mass. There is no tenderness.  Musculoskeletal:       Right hand: She exhibits decreased range of motion (Slight decreased flexion at third greater than fourth DIP.  Does  have flexion and extension strength at all joints). Normal sensation noted. Normal strength noted.  Negative Tinel, negative Phalen  Neurological: She is alert and oriented to person, place, and time.  Skin: Skin is warm and dry.  Psychiatric: She has a normal mood and affect. Her behavior is normal.  Vitals reviewed.  Vitals:   12/18/17 0915 12/18/17 0923  BP: (!) 149/81 128/76  Pulse: 66   Resp: 18   Temp: 97.7 F (36.5 C)   TempSrc: Oral   SpO2: 95%   Weight: 174 lb 3.2 oz (79 kg)   Height: 5' 4.33" (1.634 m)        Assessment & Plan:   Yolanda Green is a 72 y.o. female Right hand pain - Plan: Ambulatory referral to Hand Surgery  -Initially thought to be neuropathy, now locates more to third and fourth fingers with slight decreased flexion.  Will refer to hand surgery for further evaluation, but is improving.  History of chest pain - Plan: Ambulatory referral to Cardiology, CANCELED: Ambulatory referral to Cardiology  -Asymptomatic at present, previous evaluation was reassuring but outpatient follow-up recommended.  Refer to cardiology  Essential hypertension - Plan: amLODipine (NORVASC) 10 MG tablet  -Stable on recheck, continue same dose of Norvasc.  Hyperlipidemia, unspecified hyperlipidemia type - Plan: atorvastatin (LIPITOR) 40 MG tablet  -Recommended daily use of Lipitor, then if new side effects can discuss further.  Check lipid panel after 6 weeks of persistent use.  Meds ordered this encounter  Medications  . amLODipine  (NORVASC) 10 MG tablet    Sig: Take 1 tablet (10 mg total) by mouth daily.    Dispense:  90 tablet    Refill:  1  . atorvastatin (LIPITOR) 40 MG tablet    Sig: Take 1 tablet (40 mg total) by mouth daily.    Dispense:  90 tablet    Refill:  1   Patient Instructions   Try taking the atorvastatin daily for the next 6 weeks then follow-up for some cholesterol testing.  If you have any side effects please let me know.  No change in blood pressure medicines for now as your readings today looked okay.   I am glad to hear the hand symptoms are improved but will refer you to hand specialist for further evaluation.  I will also refer you to cardiology as we discussed but if any return of chest pain or new symptoms be seen right away here or emergency room.  Thank you for coming in today  Return to the clinic or go to the nearest emergency room if any of your symptoms worsen or new symptoms occur.    If you have lab work done today you will be contacted with your lab results within the next 2 weeks.  If you have not heard from [place] then please contact us. The fastest way to get your results is to register for My Chart.   IF you received an x-ray today, you will receive an invoice from [facility]. Please contact [facility] at [phone] with questions or concerns regarding your invoice.   IF you received labwork today, you will receive an invoice from [place]. Please contact [facility] at [phone] with questions or concerns regarding your invoice.   Our billing staff will not be able to assist you with questions regarding bills from these companies.  You will be contacted with the lab results as soon as they are available. The fastest way to get your results is to  activate your My Chart account. Instructions are located on the last page of this paperwork. If you have not heard from [place] regarding the results in 2 weeks, please contact this office.         If you have lab  work done today you will be contacted with your lab results within the next 2 weeks.  If you have not heard from [place] then please contact us. The fastest way to get your results is to register for My Chart.   IF you received an x-ray today, you will receive an invoice from [facility]. Please contact [facility] at [phone] with questions or concerns regarding your invoice.   IF you received labwork today, you will receive an invoice from [place]. Please contact [facility] at [phone] with questions or concerns regarding your invoice.   Our billing staff will not be able to assist you with questions regarding bills from these companies.  You will be contacted with the lab results as soon as they are available. The fastest way to get your results is to activate your My Chart account. Instructions are located on the last page of this paperwork. If you have not heard from [place] regarding the results in 2 weeks, please contact this office.      Signed,   Merri Ray, MD [facility] [place].  12/20/17 1:42 PM

## 2017-12-18 NOTE — Patient Instructions (Addendum)
Try taking the atorvastatin daily for the next 6 weeks then follow-up for some cholesterol testing.  If you have any side effects please let me know.  No change in blood pressure medicines for now as your readings today looked okay.   I am glad to hear the hand symptoms are improved but will refer you to hand specialist for further evaluation.  I will also refer you to cardiology as we discussed but if any return of chest pain or new symptoms be seen right away here or emergency room.  Thank you for coming in today  Return to the clinic or go to the nearest emergency room if any of your symptoms worsen or new symptoms occur.    If you have lab work done today you will be contacted with your lab results within the next 2 weeks.  If you have not heard from [place] then please contact us. The fastest way to get your results is to register for My Chart.   IF you received an x-ray today, you will receive an invoice from [facility]. Please contact [facility] at [phone] with questions or concerns regarding your invoice.   IF you received labwork today, you will receive an invoice from [place]. Please contact [facility] at [phone] with questions or concerns regarding your invoice.   Our billing staff will not be able to assist you with questions regarding bills from these companies.  You will be contacted with the lab results as soon as they are available. The fastest way to get your results is to activate your My Chart account. Instructions are located on the last page of this paperwork. If you have not heard from [place] regarding the results in 2 weeks, please contact this office.         If you have lab work done today you will be contacted with your lab results within the next 2 weeks.  If you have not heard from [place] then please contact us. The fastest way to get your results is to register for My Chart.   IF you received an x-ray today, you will receive an invoice from  [facility]. Please contact [facility] at [phone] with questions or concerns regarding your invoice.   IF you received labwork today, you will receive an invoice from [place]. Please contact [facility] at [phone] with questions or concerns regarding your invoice.   Our billing staff will not be able to assist you with questions regarding bills from these companies.  You will be contacted with the lab results as soon as they are available. The fastest way to get your results is to activate your My Chart account. Instructions are located on the last page of this paperwork. If you have not heard from [place] regarding the results in 2 weeks, please contact this office.

## 2017-12-30 ENCOUNTER — Ambulatory Visit (INDEPENDENT_AMBULATORY_CARE_PROVIDER_SITE_OTHER): Payer: Medicare Other | Admitting: Orthopaedic Surgery

## 2017-12-30 ENCOUNTER — Encounter (INDEPENDENT_AMBULATORY_CARE_PROVIDER_SITE_OTHER): Payer: Self-pay | Admitting: Orthopaedic Surgery

## 2017-12-30 VITALS — BP 107/61 | HR 73 | Resp 16 | Ht 66.0 in | Wt 174.0 lb

## 2017-12-30 DIAGNOSIS — M79641 Pain in right hand: Secondary | ICD-10-CM | POA: Diagnosis not present

## 2017-12-30 NOTE — Progress Notes (Signed)
Office Visit Note   Patient: Yolanda Green           Date of Birth: 13-Jan-1946           MRN: [id number] Visit Date: 12/30/2017              Requested by: Yolanda Green [address] [place], [place] [address] PCP: Yolanda Green   Assessment & Plan: Visit Diagnoses:  1. Pain of right hand     Plan: Right hand pain could be a combination of factors including arthritis of the metacarpal phalangeal joints and or mild carpal tunnel syndrome.  Presently not that symptomatic.  Would observe over time and have her return if she has any exacerbations.  Follow-Up Instructions: Return if symptoms worsen or fail to improve.   Orders:  No orders of the defined types were placed in this encounter.  No orders of the defined types were placed in this encounter.     Procedures: No procedures performed   Clinical Data: No additional findings.   Subjective: Chief Complaint  Patient presents with  . Right Hand - Pain  . Hand Pain    Right hand pain x 2 months, difficulty bending 3rd and 4th digits, difficulty writing, tenderness, some tingling down right shoulder into arm and hand x 1, no swelling, no surgery to hand, not diabetic  Yolanda Green works in a [employer] and was recently performing "a lot of writing".  She developed some tingling at the tip of the ring and long finger as well as some difficulty making a full fist.  She is right-hand dominant she notes that since she stopped that activity she is actually feeling a little better.  The pain seemed to originate proximally and extend into her hand but she is not sure if it was not the other way around.  He really has not been having any trouble with her neck or her shoulder.  Presently she is still having a little tingling into the 2 fingers as mentioned above but has able to make a full fist.  She is able sleeping using her hand.  No history of prior problems.  Performing some exercises that also seem to  help  HPI  Review of Systems  Constitutional: Negative for fatigue.  HENT: Negative for trouble swallowing.   Eyes: Negative for pain.  Respiratory: Negative for chest tightness.   Cardiovascular: Negative for leg swelling.  Gastrointestinal: Negative for constipation.  Endocrine: Negative for cold intolerance.  Genitourinary: Negative for difficulty urinating.  Musculoskeletal: Negative for joint swelling.  Skin: Negative for rash.  Allergic/Immunologic: Negative for food allergies.  Neurological: Positive for weakness. Negative for numbness.  Hematological: Does not bruise/bleed easily.  Psychiatric/Behavioral: Negative for sleep disturbance.     Objective: Vital Signs: BP 107/61 (BP Location: Left Arm, Patient Position: Sitting, Cuff Size: Normal)   Pulse 73   Resp 16   Ht 5\' 6"  (1.676 m)   Wt 174 lb (78.9 kg)   BMI 28.08 kg/m   Physical Exam Constitutional:      Appearance: She is well-developed.  Eyes:     Pupils: Pupils are equal, round, and reactive to light.  Pulmonary:     Effort: Pulmonary effort is normal.  Skin:    General: Skin is warm and dry.  Neurological:     Mental Status: She is alert and oriented to person, place, and time.  Psychiatric:        Behavior: Behavior  normal.     Ortho Exam awake alert and oriented x3.  Comfortable sitting.  No swelling of her right dominant hand.  Negative Tinel's and Phalen's at the wrist.  Full fist and release.  Some very minimal subjective decreased sensibility the tip of the long and ring finger.  Good capillary refill.  Has prominent veins over the radial artery bilaterally but no obvious aneurysm.  Able to touch her thumb to little finger without difficulty.  No swelling about the metacarpal joints, PIP or DIP joints.  Able to make a full fist.  No neck pain or problem with range of motion of her right shoulder  Specialty Comments:  No specialty comments available.  Imaging: No results found.   PMFS  History: Patient Active Problem List   Diagnosis Date Noted  . Essential hypertension 12/23/2006  . CHICKENPOX, HX OF 12/23/2006   Past Medical History:  Diagnosis Date  . Fibroids   . H/O varicella   . History of measles, mumps, or rubella   . Hypertension   . PMB (postmenopausal bleeding) 2005  . Stenotic cervical os 07/08/03  . Stress 06/20/09  . Trichomonas 05/08/03  . Yeast infection     Family History  Problem Relation Age of Onset  . Cancer Father        Prostate  . Diabetes Sister   . Hearing loss Son   . Cancer Paternal Aunt        3 aunts with breast cancer    Past Surgical History:  Procedure Laterality Date  . DILATION AND CURETTAGE OF UTERUS  07/08/03  . HYSTEROSCOPY  07/08/03  . INGUINAL HERNIA REPAIR  1996  . TUBAL LIGATION  1979  . WISDOM TOOTH EXTRACTION     Social History   Occupational History  . Not on file  Tobacco Use  . Smoking status: Never Smoker  . Smokeless tobacco: Never Used  Substance and Sexual Activity  . Alcohol use: Yes    Comment: occasional glass of wine  . Drug use: No  . Sexual activity: Not Currently    Birth control/protection: Surgical, Post-menopausal    Comment: BTL

## 2018-01-28 ENCOUNTER — Encounter: Payer: Self-pay | Admitting: Interventional Cardiology

## 2018-01-29 ENCOUNTER — Other Ambulatory Visit: Payer: Self-pay

## 2018-01-29 ENCOUNTER — Encounter: Payer: Self-pay | Admitting: Family Medicine

## 2018-01-29 ENCOUNTER — Ambulatory Visit (INDEPENDENT_AMBULATORY_CARE_PROVIDER_SITE_OTHER): Payer: Medicare Other | Admitting: Family Medicine

## 2018-01-29 VITALS — BP 135/71 | HR 66 | Temp 98.1°F | Resp 18 | Ht 64.49 in | Wt 173.4 lb

## 2018-01-29 DIAGNOSIS — E785 Hyperlipidemia, unspecified: Secondary | ICD-10-CM

## 2018-01-29 DIAGNOSIS — Z23 Encounter for immunization: Secondary | ICD-10-CM | POA: Diagnosis not present

## 2018-01-29 NOTE — Progress Notes (Signed)
Subjective:    Patient ID: Yolanda Green, female    DOB: May 28, 1945, 73 y.o.   MRN: [id number]   Chief Complaint  Patient presents with  . Follow-up    6 week f/u discuss cholesterol med    HPI  Yolanda Green is a 73 y.o. female who presents to Primary Care at [facility] complaining of   She has been taking Lipitor every day without side effects to her knowledge  She had blueberries and bananas at 6:00 am Hyperlipidemia:  Lab Results  Component Value Date   CHOL 150 01/29/2018   HDL 49 01/29/2018   LDLCALC 90 01/29/2018   TRIG 54 01/29/2018   CHOLHDL 3.1 01/29/2018   Lab Results  Component Value Date   ALT 30 01/29/2018   AST 20 01/29/2018   ALKPHOS 96 01/29/2018   BILITOT 0.4 01/29/2018    Review of Systems  Per HPI    Objective:   Physical Exam Vitals signs reviewed.  Constitutional:      Appearance: She is well-developed.  HENT:     Head: Normocephalic and atraumatic.  Eyes:     Conjunctiva/sclera: Conjunctivae normal.     Pupils: Pupils are equal, round, and reactive to light.  Neck:     Vascular: No carotid bruit.  Cardiovascular:     Rate and Rhythm: Normal rate and regular rhythm.     Heart sounds: Normal heart sounds.  Pulmonary:     Effort: Pulmonary effort is normal.     Breath sounds: Normal breath sounds.  Abdominal:     Palpations: Abdomen is soft. There is no pulsatile mass.     Tenderness: There is no abdominal tenderness.  Skin:    General: Skin is warm and dry.  Neurological:     Mental Status: She is alert and oriented to person, place, and time.  Psychiatric:        Behavior: Behavior normal.      Vitals:   01/29/18 0900  BP: 135/71  Pulse: 66  Resp: 18  Temp: 98.1 F (36.7 C)  TempSrc: Oral  SpO2: 95%  Weight: 173 lb 6.4 oz (78.7 kg)  Height: 5' 4.49" (1.638 m)          Assessment & Plan:  Yolanda Green is a 73 y.o. female Hyperlipidemia, unspecified hyperlipidemia type - Plan: Lipid panel,  Comprehensive metabolic panel  -  Stable, tolerating current regimen. Labs pending as above.   No orders of the defined types were placed in this encounter.  Patient Instructions    Thank you for coming in today.  I will check the cholesterol levels again, continue Lipitor once per day.  No other changes at this time.   If you have lab work done today you will be contacted with your lab results within the next 2 weeks.  If you have not heard from [place] then please contact us. The fastest way to get your results is to register for My Chart.   IF you received an x-ray today, you will receive an invoice from [facility]. Please contact [facility] at [phone] with questions or concerns regarding your invoice.   IF you received labwork today, you will receive an invoice from [place]. Please contact [facility] at [phone] with questions or concerns regarding your invoice.   Our billing staff will not be able to assist you with questions regarding bills from these companies.  You will be contacted with the lab results as soon as they are  available. The fastest way to get your results is to activate your My Chart account. Instructions are located on the last page of this paperwork. If you have not heard from [place] regarding the results in 2 weeks, please contact this office.      I personally performed the services described in this documentation, which was scribed in my presence. The recorded information has been reviewed and considered for accuracy and completeness, addended by me as needed, and agree with information above.  Signed,   Merri Ray, MD [facility] [place].  01/31/18 4:33 PM      I, Franchot Erichsen, acting as a scribe for [name], MD, have documented all relevant documentation on the behalf of [name], MD, as directed by [name], MD while in the presence of [name], MD.

## 2018-01-29 NOTE — Patient Instructions (Addendum)
  Thank you for coming in today.  I will check the cholesterol levels again, continue Lipitor once per day.  No other changes at this time.   If you have lab work done today you will be contacted with your lab results within the next 2 weeks.  If you have not heard from [place] then please contact us. The fastest way to get your results is to register for My Chart.   IF you received an x-ray today, you will receive an invoice from [facility]. Please contact [facility] at [phone] with questions or concerns regarding your invoice.   IF you received labwork today, you will receive an invoice from [place]. Please contact [facility] at [phone] with questions or concerns regarding your invoice.   Our billing staff will not be able to assist you with questions regarding bills from these companies.  You will be contacted with the lab results as soon as they are available. The fastest way to get your results is to activate your My Chart account. Instructions are located on the last page of this paperwork. If you have not heard from [place] regarding the results in 2 weeks, please contact this office.

## 2018-01-30 LAB — COMPREHENSIVE METABOLIC PANEL
ALT: 30 IU/L (ref 0–32)
AST: 20 IU/L (ref 0–40)
Albumin/Globulin Ratio: 1.6 (ref 1.2–2.2)
Albumin: 4.2 g/dL (ref 3.5–4.8)
Alkaline Phosphatase: 96 IU/L (ref 39–117)
BUN/Creatinine Ratio: 19 (ref 12–28)
BUN: 16 mg/dL (ref 8–27)
Bilirubin Total: 0.4 mg/dL (ref 0.0–1.2)
CO2: 22 mmol/L (ref 20–29)
Calcium: 9.5 mg/dL (ref 8.7–10.3)
Chloride: 105 mmol/L (ref 96–106)
Creatinine, Ser: 0.86 mg/dL (ref 0.57–1.00)
GFR calc Af Amer: 79 mL/min/{1.73_m2} (ref >59)
GFR calc non Af Amer: 69 mL/min/{1.73_m2} (ref >59)
Globulin, Total: 2.7 g/dL (ref 1.5–4.5)
Glucose: 101 mg/dL — ABNORMAL HIGH (ref 65–99)
Potassium: 4.8 mmol/L (ref 3.5–5.2)
Sodium: 140 mmol/L (ref 134–144)
Total Protein: 6.9 g/dL (ref 6.0–8.5)

## 2018-01-30 LAB — LIPID PANEL
Chol/HDL Ratio: 3.1 ratio (ref 0.0–4.4)
Cholesterol, Total: 150 mg/dL (ref 100–199)
HDL: 49 mg/dL (ref >39)
LDL Calculated: 90 mg/dL (ref 0–99)
Triglycerides: 54 mg/dL (ref 0–149)
VLDL Cholesterol Cal: 11 mg/dL (ref 5–40)

## 2018-01-31 ENCOUNTER — Encounter: Payer: Self-pay | Admitting: Family Medicine

## 2018-02-09 DIAGNOSIS — Z124 Encounter for screening for malignant neoplasm of cervix: Secondary | ICD-10-CM | POA: Diagnosis not present

## 2018-02-13 NOTE — Progress Notes (Signed)
Cardiology Office Note:    Date:  02/15/2018   ID:  Yolanda Green, DOB 1945/02/23, MRN [id number]  PCP:  Yolanda Agreste, MD  Cardiologist:  No primary care provider on file.   Referring MD: Yolanda Agreste, MD   Chief Complaint  Patient presents with  . Chest Pain    History of Present Illness:    Yolanda Green is a 73 y.o. female with a hx of hypertension who is referred by Dr. Carlota Green for consultation to evaluate chest pain.  [name] had an episode of chest discomfort in November that led to evaluation at [facility].  As best she recalls, she began having right upper chest discomfort and eventual numbness and pain radiating down her right arm.  The arm began to tingle.  The chest discomfort persisted several hours present after arriving home from work.  She mentioned this to 1 of her daughters who encouraged her to go to the emergency room to be evaluated.  An EKG was performed as were delta troponins, which were negative.  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  The hours were long.  She has not had this discomfort to recur since November.  There was no associated diaphoresis, dyspnea, palpitation, or other CV complaints.  She has no prior history of vascular disease.  She does have hypertension and is compliant with therapy, Norvasc 10 mg/day.  Also has a history of hyperlipidemia and is on therapy.  The most recent LDL was 90 earlier this month.  Over her life she has been physically active.  Recently she has cut back on exertion/exercise for no particular reason other than weather, and days being shorter.  She generally exercises and a gym setting.  She is not diabetic.  She does not smoke.  No significant family history of vascular disease.    Past Medical History:  Diagnosis Date  . Fibroids   . H/O varicella   . History of measles, mumps, or rubella   .  Hypertension   . PMB (postmenopausal bleeding) 2005  . Stenotic cervical os 07/08/03  . Stress 06/20/09  . Trichomonas 05/08/03  . Yeast infection     Past Surgical History:  Procedure Laterality Date  . DILATION AND CURETTAGE OF UTERUS  07/08/03  . HYSTEROSCOPY  07/08/03  . INGUINAL HERNIA REPAIR  1996  . TUBAL LIGATION  1979  . WISDOM TOOTH EXTRACTION      Current Medications: Current Meds  Medication Sig  . amLODipine (NORVASC) 10 MG tablet Take 1 tablet (10 mg total) by mouth daily.  Marland Kitchen atorvastatin (LIPITOR) 40 MG tablet Take 1 tablet (40 mg total) by mouth daily.     Allergies:   Patient has no known allergies.   Social History   Socioeconomic History  . Marital status: Widowed    Spouse name: Not on file  . Number of children: 2  . Years of education: Not on file  . Highest education level: Not on file  Occupational History  . Not on file  Social Needs  . Financial resource strain: Not on file  . Food insecurity:    Worry: Not on file    Inability: Not on file  . Transportation needs:    Medical: Not on file    Non-medical: Not on file  Tobacco Use  . Smoking status: Never Smoker  . Smokeless tobacco:  Never Used  Substance and Sexual Activity  . Alcohol use: Yes    Comment: occasional glass of wine  . Drug use: No  . Sexual activity: Not Currently    Birth control/protection: Surgical, Post-menopausal    Comment: BTL  Lifestyle  . Physical activity:    Days per week: Not on file    Minutes per session: Not on file  . Stress: Not on file  Relationships  . Social connections:    Talks on phone: Not on file    Gets together: Not on file    Attends religious service: Not on file    Active member of club or organization: Not on file    Attends meetings of clubs or organizations: Not on file    Relationship status: Not on file  Other Topics Concern  . Not on file  Social History Narrative  . Not on file     Family History: The patient's family  history includes Cancer in her father and paternal aunt; Diabetes in her sister; Hearing loss in her son.  ROS:   Please see the history of present illness.    Anxiety since the death of her husband 3 years ago.  Occasional difficulty sleeping.  Decreased physical activity all other systems reviewed and are negative.  EKGs/Labs/Other Studies Reviewed:    The following studies were reviewed today: No functional or imaging data  EKG:  EKG performed 11/17/2017 demonstrates normal sinus rhythm with left atrial abnormality.  Otherwise unremarkable.  Recent Labs: 11/16/2017: Hemoglobin 14.9; Platelets 210 01/29/2018: ALT 30; BUN 16; Creatinine, Ser 0.86; Potassium 4.8; Sodium 140  Recent Lipid Panel    Component Value Date/Time   CHOL 150 01/29/2018 0951   TRIG 54 01/29/2018 0951   HDL 49 01/29/2018 0951   CHOLHDL 3.1 01/29/2018 0951   CHOLHDL 3.6 04/05/2015 1210   VLDL 14 04/05/2015 1210   LDLCALC 90 01/29/2018 0951    Physical Exam:    VS:  BP 122/68   Pulse 87   Ht 5' 4.49" (1.638 m)   Wt 177 lb 12.8 oz (80.6 kg)   SpO2 95%   BMI 30.06 kg/m     Wt Readings from Last 3 Encounters:  02/15/18 177 lb 12.8 oz (80.6 kg)  01/29/18 173 lb 6.4 oz (78.7 kg)  12/30/17 174 lb (78.9 kg)     GEN: Mild obesity.. No acute distress HEENT: Normal NECK: No JVD. LYMPHATICS: No lymphadenopathy CARDIAC: No RRR.  No murmur, no gallop, no edema VASCULAR: 2+ radial and posterior tibial bilateral pulses, no bruits RESPIRATORY:  Clear to auscultation without rales, wheezing or rhonchi  ABDOMEN: Soft, non-tender, non-distended, No pulsatile mass, MUSCULOSKELETAL: No deformity  SKIN: Warm and dry NEUROLOGIC:  Alert and oriented x 3 PSYCHIATRIC:  Normal affect   ASSESSMENT:    1. Precordial pain   2. Hyperlipidemia LDL goal <70   3. Essential hypertension    PLAN:    In order of problems listed above:  1. Atypical without features suggesting ischemia.  Prolonged duration without  without evidence of infarction on EKG or cardiac marker.  No recurrence since isolated episode nearly 3 months ago.  No further work-up at this time.  If recurrence, will consider stress test and/or calcium scoring. 2. Agree with statin therapy and goal LDL less than 70. 3. Excellent blood pressure control currently.  Continue same therapy.  Low-salt diet.  Aerobic activity.  We discussed primary prevention of CAD: Overall education and awareness concerning primary risk  prevention was discussed in detail: LDL less than 70, hemoglobin A1c less than 7, blood pressure target less than 130/80 mmHg, >150 minutes of moderate aerobic activity per week, avoidance of smoking, weight control (via diet and exercise), and continued surveillance/management of/for obstructive sleep apnea.  RN follow-up if recurrence of symptoms.  Medication Adjustments/Labs and Tests Ordered: Current medicines are reviewed at length with the patient today.  Concerns regarding medicines are outlined above.  No orders of the defined types were placed in this encounter.  No orders of the defined types were placed in this encounter.   Patient Instructions  Medication Instructions:  Your physician recommends that you continue on your current medications as directed. Please refer to the Current Medication list given to you today.  If you need a refill on your cardiac medications before your next appointment, please call your pharmacy.   Lab work: None Ordered   Testing/Procedures: None Ordered   Follow-Up: At [facility], you and your health needs are our priority.  As part of our continuing mission to provide you with exceptional heart care, we have created designated Provider Care Teams.  These Care Teams include your primary Cardiologist (physician) and Advanced Practice Providers (APPs -  Physician Assistants and Nurse Practitioners) who all work together to provide you with the care you need, when you need  it. You will need a follow up appointment in  As needed. You may see Dr. Tamala Julian or one of the following Advanced Practice Providers on your designated Care Team:   Truitt Merle, NP Cecilie Kicks, NP . Kathyrn Drown, NP      Signed, Sinclair Grooms, MD  02/15/2018 1:29 PM    [place]

## 2018-02-15 ENCOUNTER — Ambulatory Visit (INDEPENDENT_AMBULATORY_CARE_PROVIDER_SITE_OTHER): Payer: Medicare Other | Admitting: Interventional Cardiology

## 2018-02-15 ENCOUNTER — Encounter: Payer: Self-pay | Admitting: Interventional Cardiology

## 2018-02-15 VITALS — BP 122/68 | HR 87 | Ht 64.49 in | Wt 177.8 lb

## 2018-02-15 DIAGNOSIS — E785 Hyperlipidemia, unspecified: Secondary | ICD-10-CM | POA: Diagnosis not present

## 2018-02-15 DIAGNOSIS — I10 Essential (primary) hypertension: Secondary | ICD-10-CM

## 2018-02-15 DIAGNOSIS — R072 Precordial pain: Secondary | ICD-10-CM

## 2018-02-15 NOTE — Patient Instructions (Signed)
Medication Instructions:  Your physician recommends that you continue on your current medications as directed. Please refer to the Current Medication list given to you today.  If you need a refill on your cardiac medications before your next appointment, please call your pharmacy.   Lab work: None Ordered   Testing/Procedures: None Ordered   Follow-Up: At [facility], you and your health needs are our priority.  As part of our continuing mission to provide you with exceptional heart care, we have created designated Provider Care Teams.  These Care Teams include your primary Cardiologist (physician) and Advanced Practice Providers (APPs -  Physician Assistants and Nurse Practitioners) who all work together to provide you with the care you need, when you need it. You will need a follow up appointment in  As needed. You may see Dr. Tamala Julian or one of the following Advanced Practice Providers on your designated Care Team:   Truitt Merle, NP Cecilie Kicks, NP . Kathyrn Drown, NP

## 2018-08-12 ENCOUNTER — Other Ambulatory Visit: Payer: Self-pay | Admitting: Family Medicine

## 2018-08-12 DIAGNOSIS — Z1231 Encounter for screening mammogram for malignant neoplasm of breast: Secondary | ICD-10-CM

## 2018-08-17 ENCOUNTER — Ambulatory Visit: Payer: Self-pay

## 2018-09-19 ENCOUNTER — Other Ambulatory Visit: Payer: Self-pay | Admitting: Family Medicine

## 2018-09-19 DIAGNOSIS — I10 Essential (primary) hypertension: Secondary | ICD-10-CM

## 2018-09-21 NOTE — Telephone Encounter (Signed)
Requested medication (s) are due for refill today: yes  Requested medication (s) are on the active medication list: yes  Last refill: 06/21/2018  Future visit scheduled: no  Notes to clinic:  Review for refill   Requested Prescriptions  Pending Prescriptions Disp Refills   amLODipine (NORVASC) 10 MG tablet [Pharmacy Med Name: AMLODIPINE BESYLATE 10 MG TAB] 90 tablet 1    Sig: TAKE 1 TABLET BY MOUTH EVERY DAY     Cardiovascular:  Calcium Channel Blockers Failed - 09/19/2018 10:22 AM      Failed - Valid encounter within last 6 months    Recent Outpatient Visits          7 months ago Hyperlipidemia, unspecified hyperlipidemia type   [facility], Ranell Patrick, MD   9 months ago Right hand pain   [facility], Ranell Patrick, MD   10 months ago Ulnar neuropathy of right upper extremity   [facility], Lilia Argue, MD   12 months ago Right arm pain   [facility], Ranell Patrick, MD   1 year ago Hyperlipidemia, unspecified hyperlipidemia type   [facility], MD             Passed - Last BP in normal range    BP Readings from Last 1 Encounters:  02/15/18 122/68

## 2018-09-26 NOTE — Telephone Encounter (Signed)
Courtesy refill given for amlodipine 10 mg #30 with no refills.  Advised pharmacy no more refills w/o scheduled and kept ov.  Sent message to schedulers to call pt and schedule f/u ov htn and med refills with greene. Dgaddy, CMA

## 2018-09-27 NOTE — Telephone Encounter (Signed)
LVM to schedule appt for med refills w/ greene. Did not leave a detailed message due to not being on her dpr giving [place] permission.

## 2018-09-28 ENCOUNTER — Other Ambulatory Visit: Payer: Self-pay

## 2018-09-28 ENCOUNTER — Ambulatory Visit
Admission: RE | Admit: 2018-09-28 | Discharge: 2018-09-28 | Disposition: A | Discharge status: Home / Self Care | Payer: Medicare Other | Source: Ambulatory Visit | Attending: Family Medicine | Admitting: Family Medicine

## 2018-09-28 DIAGNOSIS — Z1231 Encounter for screening mammogram for malignant neoplasm of breast: Secondary | ICD-10-CM

## 2018-09-29 ENCOUNTER — Other Ambulatory Visit: Payer: Self-pay | Admitting: Family Medicine

## 2018-09-29 DIAGNOSIS — R928 Other abnormal and inconclusive findings on diagnostic imaging of breast: Secondary | ICD-10-CM

## 2018-09-30 ENCOUNTER — Telehealth: Payer: Self-pay | Admitting: Family Medicine

## 2018-10-01 ENCOUNTER — Other Ambulatory Visit: Payer: Self-pay

## 2018-10-01 ENCOUNTER — Other Ambulatory Visit: Payer: Self-pay | Admitting: Family Medicine

## 2018-10-01 ENCOUNTER — Ambulatory Visit
Admission: RE | Admit: 2018-10-01 | Discharge: 2018-10-01 | Disposition: A | Discharge status: Home / Self Care | Payer: Medicare Other | Source: Ambulatory Visit | Attending: Family Medicine | Admitting: Family Medicine

## 2018-10-01 DIAGNOSIS — N6323 Unspecified lump in the left breast, lower outer quadrant: Secondary | ICD-10-CM | POA: Diagnosis not present

## 2018-10-01 DIAGNOSIS — R599 Enlarged lymph nodes, unspecified: Secondary | ICD-10-CM

## 2018-10-01 DIAGNOSIS — N6320 Unspecified lump in the left breast, unspecified quadrant: Secondary | ICD-10-CM

## 2018-10-01 DIAGNOSIS — R928 Other abnormal and inconclusive findings on diagnostic imaging of breast: Secondary | ICD-10-CM

## 2018-10-04 ENCOUNTER — Other Ambulatory Visit: Payer: 59

## 2018-10-04 NOTE — Telephone Encounter (Signed)
Disregard

## 2018-10-07 ENCOUNTER — Other Ambulatory Visit: Payer: Self-pay

## 2018-10-07 ENCOUNTER — Other Ambulatory Visit: Payer: Self-pay | Admitting: Family Medicine

## 2018-10-07 ENCOUNTER — Ambulatory Visit
Admission: RE | Admit: 2018-10-07 | Discharge: 2018-10-07 | Disposition: A | Discharge status: Home / Self Care | Payer: Medicare Other | Source: Ambulatory Visit | Attending: Family Medicine | Admitting: Family Medicine

## 2018-10-07 ENCOUNTER — Other Ambulatory Visit (HOSPITAL_COMMUNITY): Payer: Self-pay | Admitting: Diagnostic Radiology

## 2018-10-07 DIAGNOSIS — R599 Enlarged lymph nodes, unspecified: Secondary | ICD-10-CM

## 2018-10-07 DIAGNOSIS — N6320 Unspecified lump in the left breast, unspecified quadrant: Secondary | ICD-10-CM

## 2018-10-07 DIAGNOSIS — R590 Localized enlarged lymph nodes: Secondary | ICD-10-CM | POA: Diagnosis not present

## 2018-10-07 DIAGNOSIS — N6321 Unspecified lump in the left breast, upper outer quadrant: Secondary | ICD-10-CM | POA: Diagnosis not present

## 2018-10-07 DIAGNOSIS — C50412 Malignant neoplasm of upper-outer quadrant of left female breast: Secondary | ICD-10-CM | POA: Diagnosis not present

## 2018-10-07 DIAGNOSIS — C773 Secondary and unspecified malignant neoplasm of axilla and upper limb lymph nodes: Secondary | ICD-10-CM | POA: Diagnosis not present

## 2018-10-07 DIAGNOSIS — D0512 Intraductal carcinoma in situ of left breast: Secondary | ICD-10-CM | POA: Diagnosis not present

## 2018-10-07 DIAGNOSIS — Z171 Estrogen receptor negative status [ER-]: Secondary | ICD-10-CM | POA: Diagnosis not present

## 2018-10-08 ENCOUNTER — Encounter: Payer: Self-pay | Admitting: *Deleted

## 2018-10-12 ENCOUNTER — Other Ambulatory Visit: Payer: Self-pay | Admitting: *Deleted

## 2018-10-12 DIAGNOSIS — C50412 Malignant neoplasm of upper-outer quadrant of left female breast: Secondary | ICD-10-CM | POA: Insufficient documentation

## 2018-10-12 DIAGNOSIS — Z171 Estrogen receptor negative status [ER-]: Secondary | ICD-10-CM

## 2018-10-12 NOTE — Progress Notes (Addendum)
Radiation Oncology         [phone] ________________________________  Initial outpatient Consultation  Name: Yolanda Green MRN: [id number]  Date: 10/13/2018  DOB: November 09, 1945  CC:Fry, Ishmael Holter, MD  Alphonsa Overall, MD   REFERRING PHYSICIAN: Alphonsa Overall, MD  DIAGNOSIS:    ICD-10-CM   1. Malignant neoplasm of upper-outer quadrant of left breast in female, estrogen receptor negative ([place])  C50.412    Z17.1    Cancer Staging Malignant neoplasm of upper-outer quadrant of left breast in female, estrogen receptor negative ([place]) Staging form: Breast, AJCC 8th Edition - Clinical stage from 10/13/2018: Stage IIIB (cT2, cN1, cM0, G3, ER-, PR-, HER2-) - Signed by Nicholas Lose, MD on 10/13/2018   CHIEF COMPLAINT: Here to discuss management of left breast cancer  HISTORY OF PRESENT ILLNESS::Yolanda Green is a 73 y.o. female who presented with breast abnormality on the following imaging: routine screening mammogram on the date of 09/28/2018.  Symptoms, if any, at that time, were: none.   Ultrasound of breast on 10/01/2018 revealed: highly suspicious palpable 2.2 cm mass in the 2 o'clock position of the left breast, with adjacent possible satellite nodules in the 1:30 position; 3-4 suspicious axillary lymph nodes, the largest 2 of which are immediately adjacent to each other and palpable.  Biopsy on date of 10/07/2018 showed invasive ductal carcinoma with high-grade DCIS extending over the 2 o'clock and 1:30 biopsies.  Biopsied lymph node was positive for metastatic carcinoma (1/1).  ER status: 0%; PR status 0%, Her2 status negative; Grade 3.  Her family is present on speaker phone today.  PREVIOUS RADIATION THERAPY: No  PAST MEDICAL HISTORY:  has a past medical history of Family history of breast cancer, Family history of prostate cancer, Family history of stomach cancer, Fibroids, H/O varicella, History of measles, mumps, or rubella, Hypertension, PMB (postmenopausal bleeding) (2005),  Stenotic cervical os (07/08/03), Stress (06/20/09), Trichomonas (05/08/03), and Yeast infection.    PAST SURGICAL HISTORY: Past Surgical History:  Procedure Laterality Date   DILATION AND CURETTAGE OF UTERUS  07/08/03   HYSTEROSCOPY  07/08/03   INGUINAL HERNIA REPAIR  1996   TUBAL LIGATION  1979   WISDOM TOOTH EXTRACTION      FAMILY HISTORY: family history includes Breast cancer (age of onset: 31) in her paternal aunt; Diabetes in her sister; Hearing loss in her son; Prostate cancer (age of onset: 87) in her father; Stomach cancer (age of onset: 60) in her paternal grandfather.  SOCIAL HISTORY:  reports that she has never smoked. She has never used smokeless tobacco. She reports current alcohol use. She reports that she does not use drugs.  ALLERGIES: Patient has no known allergies.  MEDICATIONS:  Current Outpatient Medications  Medication Sig Dispense Refill   amLODipine (NORVASC) 10 MG tablet TAKE 1 TABLET BY MOUTH EVERY DAY 30 tablet 0   atorvastatin (LIPITOR) 40 MG tablet Take 1 tablet (40 mg total) by mouth daily. 90 tablet 1   dexamethasone (DECADRON) 4 MG tablet Take 1 tablets by mouth daily starting the day after Carboplatin and Cytoxan x 2 days. Take with food. 16 tablet 0   lidocaine-prilocaine (EMLA) cream Apply to affected area once 30 g 3   LORazepam (ATIVAN) 0.5 MG tablet Take 1 tablet (0.5 mg total) by mouth at bedtime as needed for sleep. 30 tablet 0   ondansetron (ZOFRAN) 8 MG tablet Take 1 tablet (8 mg total) by mouth 2 (two) times daily as needed. Start on the third day after  chemotherapy. 30 tablet 1   prochlorperazine (COMPAZINE) 10 MG tablet Take 1 tablet (10 mg total) by mouth every 6 (six) hours as needed (Nausea or vomiting). 30 tablet 1   No current facility-administered medications for this encounter.     REVIEW OF SYSTEMS: As above   PHYSICAL EXAM:   Vitals:   10/13/18 0910  BP: (!) 157/84  Pulse: 89  Resp: 18  Temp: 98.7 F (37.1 C)  SpO2:  96%   Filed Weights   10/13/18 0910  Weight: 172 lb 12.8 oz (78.4 kg)   General: Alert and oriented, in no acute distress Extremities: No cyanosis or edema. Lymphatics: see breast/axilla Skin: No concerning lesions. Musculoskeletal: symmetric strength and muscle tone throughout. Neurologic: Cranial nerves II through XII are grossly intact. No tremor.Coordination is intact. Psychiatric: Judgment and insight are intact. Affect is appropriate. Breasts: thickening in the UOQ of left breast, and I can palpate as least 2 masses in left axilla . No other palpable masses appreciated in the breasts or axillae bilaterally .   ECOG = 1  0 - Asymptomatic (Fully active, able to carry on all predisease activities without restriction)  1 - Symptomatic but completely ambulatory (Restricted in physically strenuous activity but ambulatory and able to carry out work of a light or sedentary nature. For example, light housework, office work)  2 - Symptomatic, <50% in bed during the day (Ambulatory and capable of all self care but unable to carry out any work activities. Up and about more than 50% of waking hours)  3 - Symptomatic, >50% in bed, but not bedbound (Capable of only limited self-care, confined to bed or chair 50% or more of waking hours)  4 - Bedbound (Completely disabled. Cannot carry on any self-care. Totally confined to bed or chair)  5 - Death   Eustace Pen MM, Creech RH, Tormey DC, et al. [phone]). "Toxicity and response criteria of the [facility]". [place] Oncol. 5 (6): 649-55   LABORATORY DATA:  Lab Results  Component Value Date   WBC 5.9 10/13/2018   HGB 14.6 10/13/2018   HCT 42.7 10/13/2018   MCV 96.8 10/13/2018   PLT 235 10/13/2018   CMP     Component Value Date/Time   NA 141 10/13/2018 0845   NA 140 01/29/2018 0951   K 4.1 10/13/2018 0845   CL 108 10/13/2018 0845   CO2 24 10/13/2018 0845   GLUCOSE 101 (H) 10/13/2018 0845   BUN 11 10/13/2018 0845    BUN 16 01/29/2018 0951   CREATININE 0.83 10/13/2018 0845   CREATININE 0.87 04/05/2015 1210   CALCIUM 9.2 10/13/2018 0845   PROT 7.8 10/13/2018 0845   PROT 6.9 01/29/2018 0951   ALBUMIN 3.9 10/13/2018 0845   ALBUMIN 4.2 01/29/2018 0951   AST 24 10/13/2018 0845   ALT 26 10/13/2018 0845   ALKPHOS 91 10/13/2018 0845   BILITOT 0.5 10/13/2018 0845   GFRNONAA >60 10/13/2018 0845   GFRNONAA 69 04/05/2015 1210   GFRAA >60 10/13/2018 0845   GFRAA 80 04/05/2015 1210         RADIOGRAPHY: US Breast Ltd Uni Left Inc Axilla  Result Date: 10/01/2018 CLINICAL DATA:  73 year old patient recalled from recent screening mammogram for left breast mass left axillary mass. EXAM: ULTRASOUND OF THE LEFT BREAST COMPARISON:  Previous exam(s). FINDINGS: On physical exam, there is a palpable firm mass the 2 o'clock position of the left breast 7 cm from nipple approximately 2.5 cm. There is  an approximately 4 cm palpable mass in the inferior left axilla. Targeted ultrasound is performed, showing a heterogeneously hypoechoic irregular mass at 2 o'clock position 7 cm from the nipple measuring 2.2 x 2.2 x 1.5 cm. There is internal vascular flow. At approximately 1:30 position 6 cm from the nipple are 2 adjacent hypoechoic nodules that in total span 1.4 cm. The largest individual nodule is approximately 0.9 x 0.4 bile 0.5 cm. In the inferior left axilla are 2 adjacent dominant lymph nodes, one of which has no visible hilum remaining. This lymph node measures 3.0 x 2.1 x 2.4 cm, is hypoechoic and rounded. A lymph node immediately adjacent to it has cortical thickening of approximately 6 mm. A third lymph node visualized is small in size but has a larger cortex relative to its size, metastatic involvement is not excluded. There is a fourth lymph node that has a thickened hypoechoic cortex and measures 1.2 x 0.8 x 0.7 cm. IMPRESSION: Highly suspicious palpable mass in the 2 o'clock position of the left breast with adjacent  possible satellite nodules in the 1:30 position of the left breast. Three to four suspicious axillary lymph nodes, the largest 2 of which are immediately adjacent to each other and palpable. RECOMMENDATION: Three ultrasound-guided core needle biopsies are recommended and are being scheduled for the patient. These include biopsies of the dominant palpable mass, the adjacent satellite nodules at 1:30 position, and one of the axillary lymph nodes. I have discussed the findings and recommendations with the patient. If applicable, a reminder letter will be sent to the patient regarding the next appointment. BI-RADS CATEGORY  5: Highly suggestive of malignancy. Electronically Signed   By: Curlene Dolphin M.D.   On: 10/01/2018 16:22   Mm 3d Screen Breast Bilateral  Addendum Date: 10/01/2018   ADDENDUM REPORT: 10/01/2018 16:03 ADDENDUM: The original report contains laterality errors. The abnormality is noted in the left breast and left axilla. There is no abnormality in the right breast. Follow-up diagnostic imaging should be of the left breast and left axilla. Electronically Signed   By: Lajean Manes M.D.   On: 10/01/2018 16:03   Result Date: 10/01/2018 CLINICAL DATA:  Screening. EXAM: DIGITAL SCREENING BILATERAL MAMMOGRAM WITH TOMO AND CAD COMPARISON:  Previous exam(s). ACR Breast Density Category b: There are scattered areas of fibroglandular density. FINDINGS: In the right breast, a possible mass as well as abnormal right axillary lymph nodes warrant further evaluation. In the left breast, no findings suspicious for malignancy. Images were processed with CAD. IMPRESSION: Further evaluation is suggested for possible mass in the right breast and abnormal right axillary lymph node/nodes. RECOMMENDATION: Diagnostic mammogram and possibly ultrasound of the right breast. (Code:[id number]) The patient will be contacted regarding the findings, and additional imaging will be scheduled. BI-RADS CATEGORY  0: Incomplete. Need  additional imaging evaluation and/or prior mammograms for comparison. Electronically Signed: By: Lajean Manes M.D. On: 09/28/2018 15:34   [place] Axillary Node Core Biopsy Left  Addendum Date: 10/08/2018   ADDENDUM REPORT: 10/08/2018 15:51 ADDENDUM: Pathology revealed GRADE III INVASIVE DUCTAL CARCINOMA, HIGH GRADE DUCTAL CARCINOMA IN SITU of the LEFT breast, 2 o'clock (ribbon clip). This was found to be concordant by Dr. Curlene Dolphin. Pathology revealed GRADE III INVASIVE DUCTAL CARCINOMA, HIGH GRADE DUCTAL CARCINOMA IN SITU of the LEFT breast, 1:30 o'clock (coil clip). This was found to be concordant by Dr. Curlene Dolphin. Pathology revealed METASTATIC CARCINOMA TO LYMPH NODE. FOCUS OF THE CARCINOMA MEASURES 0.6 CM IN GREATEST DIMENSION of the  LEFT axilla (Q clip). This was found to be concordant by Dr. Curlene Dolphin. Pathology results were discussed with the patient by telephone. The patient reported doing well after the biopsies with tenderness at the sites. Biopsy instructions and care were reviewed and questions were answered. The patient was encouraged to call [facility] with any additional concerns or questions. The patient was referred to [facility] on October 13, 2018. Pathology results reported by Stacie Acres, RN on 10/08/2018. Electronically Signed   By: Curlene Dolphin M.D.   On: 10/08/2018 15:51   Result Date: 10/08/2018 CLINICAL DATA:  Ultrasound-guided core needle biopsy of 1 of the suspicious axillary lymph nodes was recommended. EXAM: [place] AXILLARY NODE CORE BIOPSY LEFT COMPARISON:  Previous exam(s). FINDINGS: I met with the patient and we discussed the procedure of ultrasound-guided biopsy, including benefits and alternatives. We discussed the high likelihood of a successful procedure. We discussed the risks of the procedure, including infection, bleeding, tissue injury, clip migration, and  inadequate sampling. Informed written consent was given. The usual time-out protocol was performed immediately prior to the procedure. Using sterile technique and 1% Lidocaine as local anesthetic, under direct ultrasound visualization, a 14 gauge spring-loaded device was used to perform biopsy of an enlarged lymph node with a diffusely thickened hypoechoic cortex using a medial approach. At the conclusion of the procedure a Q shaped tissue marker clip was deployed into the biopsy cavity. Follow up 2 view mammogram was performed and dictated separately. IMPRESSION: Ultrasound guided biopsy of a left axillary lymph node. No apparent complications. Electronically Signed: By: Curlene Dolphin M.D. On: 10/07/2018 14:36   Mm Clip Placement Left  Result Date: 10/07/2018 CLINICAL DATA:  Three ultrasound-guided core needle biopsies were performed today. EXAM: DIAGNOSTIC LEFT MAMMOGRAM POST ULTRASOUND BIOPSIES COMPARISON:  Previous exam(s). FINDINGS: Mammographic images were obtained following ultrasound guided biopsy of the left breast and left axilla. Ribbon shaped biopsy clip is satisfactorily positioned in the suspicious mass in the 2 o'clock position of the left breast. Coil shaped biopsy clip is positioned in the expected location of the biopsy of the sonographically detected adjacent hypoechoic nodules at 1:30 position. AQ shaped biopsy clip is satisfactorily positioned within enlarged axillary lymph node. IMPRESSION: Satisfactory position of biopsy clips as described above. Final Assessment: Post Procedure Mammograms for Marker Placement Electronically Signed   By: Curlene Dolphin M.D.   On: 10/07/2018 14:36   [place] Lt Breast Bx W Loc Dev 1st Lesion Img Bx Spec US Guide  Addendum Date: 10/08/2018   ADDENDUM REPORT: 10/08/2018 15:50 ADDENDUM: Pathology revealed GRADE III INVASIVE DUCTAL CARCINOMA, HIGH GRADE DUCTAL CARCINOMA IN SITU of the LEFT breast, 2 o'clock (ribbon clip). This was found to be concordant by Dr.  Curlene Dolphin. Pathology revealed GRADE III INVASIVE DUCTAL CARCINOMA, HIGH GRADE DUCTAL CARCINOMA IN SITU of the LEFT breast, 1:30 o'clock (coil clip). This was found to be concordant by Dr. Curlene Dolphin. Pathology revealed METASTATIC CARCINOMA TO LYMPH NODE. FOCUS OF THE CARCINOMA MEASURES 0.6 CM IN GREATEST DIMENSION of the LEFT axilla (Q clip). This was found to be concordant by Dr. Curlene Dolphin. Pathology results were discussed with the patient by telephone. The patient reported doing well after the biopsies with tenderness at the sites. Biopsy instructions and care were reviewed and questions were answered. The patient was encouraged to call [facility] with any additional concerns or questions. The patient  was referred to [facility] at [facility] on October 13, 2018. Pathology results reported by Stacie Acres, RN on 10/08/2018. Electronically Signed   By: Curlene Dolphin M.D.   On: 10/08/2018 15:50   Result Date: 10/08/2018 CLINICAL DATA:  Ultrasound-guided core needle biopsy was recommended of 2 adjacent hypoechoic nodules in the 1:30 axis of the left breast approximately 6 cm from the nipple. EXAM: ULTRASOUND GUIDED LEFT BREAST CORE NEEDLE BIOPSY COMPARISON:  Previous exam(s). FINDINGS: I met with the patient and we discussed the procedure of ultrasound-guided biopsy, including benefits and alternatives. We discussed the high likelihood of a successful procedure. We discussed the risks of the procedure, including infection, bleeding, tissue injury, clip migration, and inadequate sampling. Informed written consent was given. The usual time-out protocol was performed immediately prior to the procedure. Lesion quadrant: Upper outer quadrant Using sterile technique and 1% Lidocaine as local anesthetic, under direct ultrasound visualization, a 12 gauge spring-loaded device was used to perform biopsy of a hypoechoic  nodule(s) using a lateral approach. At the conclusion of the procedure a coil tissue marker clip was deployed into the biopsy cavity. Follow up 2 view mammogram was performed and dictated separately. IMPRESSION: Ultrasound guided biopsy of the left breast. No apparent complications. Electronically Signed: By: Curlene Dolphin M.D. On: 10/07/2018 14:25   [place] Lt Breast Bx W Loc Dev Ea Add Lesion Img Bx Spec US Guide  Addendum Date: 10/08/2018   ADDENDUM REPORT: 10/08/2018 15:49 ADDENDUM: Pathology revealed GRADE III INVASIVE DUCTAL CARCINOMA, HIGH GRADE DUCTAL CARCINOMA IN SITU of the LEFT breast, 2 o'clock (ribbon clip). This was found to be concordant by Dr. Curlene Dolphin. Pathology revealed GRADE III INVASIVE DUCTAL CARCINOMA, HIGH GRADE DUCTAL CARCINOMA IN SITU of the LEFT breast, 1:30 o'clock (coil clip). This was found to be concordant by Dr. Curlene Dolphin. Pathology revealed METASTATIC CARCINOMA TO LYMPH NODE. FOCUS OF THE CARCINOMA MEASURES 0.6 CM IN GREATEST DIMENSION of the LEFT axilla (Q clip). This was found to be concordant by Dr. Curlene Dolphin. Pathology results were discussed with the patient by telephone. The patient reported doing well after the biopsies with tenderness at the sites. Biopsy instructions and care were reviewed and questions were answered. The patient was encouraged to call [facility] with any additional concerns or questions. The patient was referred to [facility] at [facility] on October 13, 2018. Pathology results reported by Stacie Acres, RN on 10/08/2018. Electronically Signed   By: Curlene Dolphin M.D.   On: 10/08/2018 15:49   Result Date: 10/08/2018 CLINICAL DATA:  Ultrasound-guided core needle biopsy was recommended of a palpable suspicious mass in the 2 o'clock position of the left breast 7 cm from the nipple measuring 2.2 x 2.2 x 1.5 cm. EXAM: ULTRASOUND GUIDED LEFT BREAST CORE NEEDLE  BIOPSY COMPARISON:  Previous exam(s). FINDINGS: I met with the patient and we discussed the procedure of ultrasound-guided biopsy, including benefits and alternatives. We discussed the high likelihood of a successful procedure. We discussed the risks of the procedure, including infection, bleeding, tissue injury, clip migration, and inadequate sampling. Informed written consent was given. The usual time-out protocol was performed immediately prior to the procedure. Lesion quadrant: Upper outer quadrant Using sterile technique and 1% Lidocaine as local anesthetic, under direct ultrasound visualization, a 12 gauge spring-loaded device was used to perform biopsy of a palpable mass using a lateral approach. At the  conclusion of the procedure a ribbon tissue marker clip was deployed into the biopsy cavity. Follow up 2 view mammogram was performed and dictated separately. IMPRESSION: Ultrasound guided biopsy of the left breast. No apparent complications. Electronically Signed: By: Curlene Dolphin M.D. On: 10/07/2018 14:24      IMPRESSION/PLAN: Left Breast Cancer metastatic to lymph nodes  Anticipate staging scans, neoadjuvant chemotherapy, surgery, and then radiotherapy (RT.)  It was a pleasure meeting the patient today. We discussed the risks, benefits, and side effects of radiotherapy. I recommend radiotherapy to the left breast/chest wall and regional nodes to reduce her risk of locoregional recurrence by 2/3.  We discussed that radiation would take approximately 6 weeks to complete and that I would give the patient a few weeks to heal following surgery before starting treatment planning. We spoke about acute effects including skin irritation and fatigue as well as much less common late effects including internal organ injury or irritation. We spoke about the latest technology that is used to minimize the risk of late effects for patients undergoing radiotherapy to the breast or chest wall. No guarantees of  treatment were given. The patient is enthusiastic about proceeding with treatment. I look forward to participating in the patient's care.  I will await her referral back to me for postoperative follow-up and eventual CT simulation/treatment planning. All questions answered to the best of my ability, and the patient and her family expressed satisfaction with this plan.  __________________________________________   Eppie Gibson, MD  This document serves as a record of services personally performed by Eppie Gibson, MD. It was created on her behalf by Wilburn Mylar, a trained medical scribe. The creation of this record is based on the scribe's personal observations and the provider's statements to them. This document has been checked and approved by the attending provider.

## 2018-10-12 NOTE — Progress Notes (Signed)
[facility] CONSULT NOTE  Patient Care Team: Laurey Morale, MD as PCP - General (Family Medicine) Belva Crome, MD as PCP - Cardiology (Cardiology) Ena Dawley, MD as Consulting Physician (Obstetrics and Gynecology) Mauro Kaufmann, RN as Oncology Nurse Navigator Rockwell Germany, RN as Oncology Nurse Navigator Nicholas Lose, MD as Consulting Physician (Hematology and Oncology) Eppie Gibson, MD as Attending Physician (Radiation Oncology)  CHIEF COMPLAINTS/PURPOSE OF CONSULTATION:  Newly diagnosed breast cancer  HISTORY OF PRESENTING ILLNESS:  Yolanda Green 73 y.o. female is here because of recent diagnosis of triple negative invasive ductal carcinoma of the left breast. The cancer was detected on a routine screening mammogram on 09/28/18 and was palpable on exam. US of the left breast on 10/01/18 showed a 2.2cm mass at the 2 o'clock position, 2 adjacent nodules spanning 1.4cm at the 1:30 position, and 4 abnormal lymph nodes in the left axilla. Biopsy on 10/07/18 showed invasive ductal carcinoma with high grade DCIS, grade 3, HER-2 negative (0), ER- 0%, PR- 0%, Ki67 80% in the left breast and axilla. She presents to the clinic today for initial evaluation and discussion of treatment options.   I reviewed her records extensively and collaborated the history with the patient.  SUMMARY OF ONCOLOGIC HISTORY: Oncology History  Malignant neoplasm of upper-outer quadrant of left breast in female, estrogen receptor negative ([place])  10/12/2018 Initial Diagnosis   Routine screening mammogram detected, in the left breast, a 2.2cm mass at the 2 o'clock position, 2 adjacent nodules spanning 1.4cm at the 1:30 position, and 4 abnormal lymph nodes in the left axilla. Biopsy showed IDC with high grade DCIS, grade 3, HER-2 - (0), ER- 0%, PR- 0%, Ki67 80% in the left breast and axilla.   10/13/2018 Cancer Staging   Staging form: Breast, AJCC 8th Edition - Clinical stage from  10/13/2018: Stage IIIB (cT2, cN1, cM0, G3, ER-, PR-, HER2-) - Signed by Nicholas Lose, MD on 10/13/2018     MEDICAL HISTORY:  Past Medical History:  Diagnosis Date  . Fibroids   . H/O varicella   . History of measles, mumps, or rubella   . Hypertension   . PMB (postmenopausal bleeding) 2005  . Stenotic cervical os 07/08/03  . Stress 06/20/09  . Trichomonas 05/08/03  . Yeast infection     SURGICAL HISTORY: Past Surgical History:  Procedure Laterality Date  . DILATION AND CURETTAGE OF UTERUS  07/08/03  . HYSTEROSCOPY  07/08/03  . INGUINAL HERNIA REPAIR  1996  . TUBAL LIGATION  1979  . WISDOM TOOTH EXTRACTION      SOCIAL HISTORY: Social History   Socioeconomic History  . Marital status: Widowed    Spouse name: Not on file  . Number of children: 2  . Years of education: Not on file  . Highest education level: Not on file  Occupational History  . Not on file  Social Needs  . Financial resource strain: Not on file  . Food insecurity    Worry: Not on file    Inability: Not on file  . Transportation needs    Medical: Not on file    Non-medical: Not on file  Tobacco Use  . Smoking status: Never Smoker  . Smokeless tobacco: Never Used  Substance and Sexual Activity  . Alcohol use: Yes    Comment: occasional glass of wine  . Drug use: No  . Sexual activity: Not Currently    Birth control/protection: Surgical, Post-menopausal    Comment: BTL  Lifestyle  . Physical activity    Days per week: Not on file    Minutes per session: Not on file  . Stress: Not on file  Relationships  . Social Herbalist on phone: Not on file    Gets together: Not on file    Attends religious service: Not on file    Active member of club or organization: Not on file    Attends meetings of clubs or organizations: Not on file    Relationship status: Not on file  . Intimate partner violence    Fear of current or ex partner: Not on file    Emotionally abused: Not on file     Physically abused: Not on file    Forced sexual activity: Not on file  Other Topics Concern  . Not on file  Social History Narrative  . Not on file    FAMILY HISTORY: Family History  Problem Relation Age of Onset  . Cancer Father        Prostate  . Diabetes Sister   . Hearing loss Son   . Cancer Paternal Aunt        1 aunt with breast cancer    ALLERGIES:  has No Known Allergies.  MEDICATIONS:  Current Outpatient Medications  Medication Sig Dispense Refill  . amLODipine (NORVASC) 10 MG tablet TAKE 1 TABLET BY MOUTH EVERY DAY 30 tablet 0  . atorvastatin (LIPITOR) 40 MG tablet Take 1 tablet (40 mg total) by mouth daily. 90 tablet 1   No current facility-administered medications for this visit.     REVIEW OF SYSTEMS:   Constitutional: Denies fevers, chills or abnormal night sweats Eyes: Denies blurriness of vision, double vision or watery eyes Ears, nose, mouth, throat, and face: Denies mucositis or sore throat Respiratory: Denies cough, dyspnea or wheezes Cardiovascular: Denies palpitation, chest discomfort or lower extremity swelling Gastrointestinal:  Denies nausea, heartburn or change in bowel habits Skin: Denies abnormal skin rashes Lymphatics: Denies new lymphadenopathy or easy bruising Neurological:Denies numbness, tingling or new weaknesses Behavioral/Psych: Mood is stable, no new changes  Breast: Palpable left breast and axilla masses All other systems were reviewed with the patient and are negative.  PHYSICAL EXAMINATION: ECOG PERFORMANCE STATUS: 1 - Symptomatic but completely ambulatory  Vitals:   10/13/18 0910  BP: (!) 157/84  Pulse: 89  Resp: 18  Temp: 98.7 F (37.1 C)  SpO2: 96%   Filed Weights   10/13/18 0910  Weight: 172 lb 12.8 oz (78.4 kg)    GENERAL:alert, no distress and comfortable SKIN: skin color, texture, turgor are normal, no rashes or significant lesions EYES: normal, conjunctiva are pink and non-injected, sclera clear  OROPHARYNX:no exudate, no erythema and lips, buccal mucosa, and tongue normal  NECK: supple, thyroid normal size, non-tender, without nodularity LYMPH:  no palpable lymphadenopathy in the cervical, axillary or inguinal LUNGS: clear to auscultation and percussion with normal breathing effort HEART: regular rate & rhythm and no murmurs and no lower extremity edema ABDOMEN:abdomen soft, non-tender and normal bowel sounds Musculoskeletal:no cyanosis of digits and no clubbing  PSYCH: alert & oriented x 3 with fluent speech NEURO: no focal motor/sensory deficits BREAST: No palpable nodules in breast. No palpable axillary or supraclavicular lymphadenopathy (exam performed in the presence of a chaperone)   LABORATORY DATA:  I have reviewed the data as listed Lab Results  Component Value Date   WBC 5.9 10/13/2018   HGB 14.6 10/13/2018   HCT 42.7 10/13/2018  MCV 96.8 10/13/2018   PLT 235 10/13/2018   Lab Results  Component Value Date   NA 141 10/13/2018   K 4.1 10/13/2018   CL 108 10/13/2018   CO2 24 10/13/2018    RADIOGRAPHIC STUDIES: I have personally reviewed the radiological reports and agreed with the findings in the report.  ASSESSMENT AND PLAN:  Malignant neoplasm of upper-outer quadrant of left breast in female, estrogen receptor negative ([place]) 10/12/2018:Routine screening mammogram detected, in the left breast, a 2.2cm mass at the 2 o'clock position, 2 adjacent nodules spanning 1.4cm at the 1:30 position, and 4 abnormal lymph nodes in the left axilla. Biopsy showed IDC with high grade DCIS, grade 3, HER-2 - (0), ER- 0%, PR- 0%, Ki67 80% in the left breast and axilla. T2N1 stage IIIb clinical stage  Pathology and radiology counseling: Discussed with the patient, the details of pathology including the type of breast cancer,the clinical staging, the significance of ER, PR and HER-2/neu receptors and the implications for treatment. After reviewing the pathology in detail, we  proceeded to discuss the different treatment options between surgery, radiation, chemotherapy, antiestrogen therapies.  Recommendation: 1.  Neoadjuvant chemotherapy with dose dense Adriamycin and Cytoxan followed by Taxol and carboplatin 2.  Breast conserving surgery with axillary lymph node dissection 3.  Adjuvant radiation therapy  Chemotherapy Counseling: I discussed the risks and benefits of chemotherapy including the risks of nausea/ vomiting, risk of infection from low WBC count, fatigue due to chemo or anemia, bruising or bleeding due to low platelets, mouth sores, loss/ change in taste and decreased appetite. Liver and kidney function will be monitored through out chemotherapy as abnormalities in liver and kidney function may be a side effect of treatment. Cardiac dysfunction due to Adriamycin and neuropathy risk from Taxol was discussed in detail. Risk of permanent bone marrow dysfunction and leukemia due to chemo were also discussed.  Given the fact that she had a paternal aunt with breast cancer and father had prostate cancer, I recommended genetic testing.  Staging scans will be obtained Echocardiogram Chemo class Port placement SWOG [id number] clinical trials discussion  Return to clinic in 2 weeks to start chemotherapy   All questions were answered. The patient knows to call the clinic with any problems, questions or concerns.   Rulon Eisenmenger, MD 10/13/2018    I, Molly Dorshimer, am acting as scribe for Nicholas Lose, MD.  I have reviewed the above documentation for accuracy and completeness, and I agree with the above.

## 2018-10-13 ENCOUNTER — Ambulatory Visit: Payer: Medicare Other | Attending: Surgery | Admitting: Physical Therapy

## 2018-10-13 ENCOUNTER — Encounter: Payer: Self-pay | Admitting: Hematology and Oncology

## 2018-10-13 ENCOUNTER — Other Ambulatory Visit: Payer: Self-pay | Admitting: *Deleted

## 2018-10-13 ENCOUNTER — Encounter: Payer: Self-pay | Admitting: Medical Oncology

## 2018-10-13 ENCOUNTER — Inpatient Hospital Stay: Payer: Medicare Other

## 2018-10-13 ENCOUNTER — Encounter: Payer: Self-pay | Admitting: Radiation Oncology

## 2018-10-13 ENCOUNTER — Ambulatory Visit (HOSPITAL_BASED_OUTPATIENT_CLINIC_OR_DEPARTMENT_OTHER): Payer: Medicare Other | Admitting: Genetic Counselor

## 2018-10-13 ENCOUNTER — Other Ambulatory Visit (HOSPITAL_COMMUNITY): Payer: Self-pay | Admitting: Surgery

## 2018-10-13 ENCOUNTER — Encounter: Payer: Self-pay | Admitting: Physical Therapy

## 2018-10-13 ENCOUNTER — Ambulatory Visit
Admission: RE | Admit: 2018-10-13 | Discharge: 2018-10-13 | Disposition: A | Discharge status: Home / Self Care | Payer: Medicare Other | Source: Ambulatory Visit | Attending: Radiation Oncology | Admitting: Radiation Oncology

## 2018-10-13 ENCOUNTER — Encounter: Payer: Self-pay | Admitting: Genetic Counselor

## 2018-10-13 ENCOUNTER — Other Ambulatory Visit: Payer: Self-pay

## 2018-10-13 ENCOUNTER — Inpatient Hospital Stay: Payer: Medicare Other | Attending: Hematology and Oncology | Admitting: Hematology and Oncology

## 2018-10-13 VITALS — BP 157/84 | HR 89 | Temp 98.7°F | Resp 18 | Ht 64.49 in | Wt 172.8 lb

## 2018-10-13 DIAGNOSIS — C50912 Malignant neoplasm of unspecified site of left female breast: Secondary | ICD-10-CM | POA: Diagnosis not present

## 2018-10-13 DIAGNOSIS — Z8042 Family history of malignant neoplasm of prostate: Secondary | ICD-10-CM | POA: Diagnosis not present

## 2018-10-13 DIAGNOSIS — Z803 Family history of malignant neoplasm of breast: Secondary | ICD-10-CM

## 2018-10-13 DIAGNOSIS — Z171 Estrogen receptor negative status [ER-]: Secondary | ICD-10-CM

## 2018-10-13 DIAGNOSIS — C50412 Malignant neoplasm of upper-outer quadrant of left female breast: Secondary | ICD-10-CM | POA: Diagnosis not present

## 2018-10-13 DIAGNOSIS — C773 Secondary and unspecified malignant neoplasm of axilla and upper limb lymph nodes: Secondary | ICD-10-CM | POA: Diagnosis not present

## 2018-10-13 DIAGNOSIS — R293 Abnormal posture: Secondary | ICD-10-CM | POA: Insufficient documentation

## 2018-10-13 DIAGNOSIS — Z800 Family history of malignant neoplasm of digestive organs: Secondary | ICD-10-CM | POA: Insufficient documentation

## 2018-10-13 LAB — CMP (CANCER CENTER ONLY)
ALT: 26 U/L (ref 0–44)
AST: 24 U/L (ref 15–41)
Albumin: 3.9 g/dL (ref 3.5–5.0)
Alkaline Phosphatase: 91 U/L (ref 38–126)
Anion gap: 9 (ref 5–15)
BUN: 11 mg/dL (ref 8–23)
CO2: 24 mmol/L (ref 22–32)
Calcium: 9.2 mg/dL (ref 8.9–10.3)
Chloride: 108 mmol/L (ref 98–111)
Creatinine: 0.83 mg/dL (ref 0.44–1.00)
GFR, Est AFR Am: >60 mL/min (ref >60)
GFR, Estimated: >60 mL/min (ref >60)
Glucose, Bld: 101 mg/dL — ABNORMAL HIGH (ref 70–99)
Potassium: 4.1 mmol/L (ref 3.5–5.1)
Sodium: 141 mmol/L (ref 135–145)
Total Bilirubin: 0.5 mg/dL (ref 0.3–1.2)
Total Protein: 7.8 g/dL (ref 6.5–8.1)

## 2018-10-13 LAB — CBC WITH DIFFERENTIAL (CANCER CENTER ONLY)
Abs Immature Granulocytes: 0.01 10*3/uL (ref 0.00–0.07)
Basophils Absolute: 0 10*3/uL (ref 0.0–0.1)
Basophils Relative: 0 %
Eosinophils Absolute: 0.1 10*3/uL (ref 0.0–0.5)
Eosinophils Relative: 2 %
HCT: 42.7 % (ref 36.0–46.0)
Hemoglobin: 14.6 g/dL (ref 12.0–15.0)
Immature Granulocytes: 0 %
Lymphocytes Relative: 32 %
Lymphs Abs: 1.9 10*3/uL (ref 0.7–4.0)
MCH: 33.1 pg (ref 26.0–34.0)
MCHC: 34.2 g/dL (ref 30.0–36.0)
MCV: 96.8 fL (ref 80.0–100.0)
Monocytes Absolute: 0.5 10*3/uL (ref 0.1–1.0)
Monocytes Relative: 8 %
Neutro Abs: 3.4 10*3/uL (ref 1.7–7.7)
Neutrophils Relative %: 58 %
Platelet Count: 235 10*3/uL (ref 150–400)
RBC: 4.41 MIL/uL (ref 3.87–5.11)
RDW: 12.6 % (ref 11.5–15.5)
WBC Count: 5.9 10*3/uL (ref 4.0–10.5)
nRBC: 0 % (ref 0.0–0.2)

## 2018-10-13 MED ORDER — DEXAMETHASONE 4 MG PO TABS: ORAL_TABLET | ORAL | 0 refills | Status: DC

## 2018-10-13 MED ORDER — ONDANSETRON HCL 8 MG PO TABS: 8.0000 mg | ORAL_TABLET | Freq: Two times a day (BID) | ORAL | 1 refills | Status: DC | PRN

## 2018-10-13 MED ORDER — LIDOCAINE-PRILOCAINE 2.5-2.5 % EX CREA: TOPICAL_CREAM | CUTANEOUS | 3 refills | Status: DC

## 2018-10-13 MED ORDER — PROCHLORPERAZINE MALEATE 10 MG PO TABS: 10.0000 mg | ORAL_TABLET | Freq: Four times a day (QID) | ORAL | 1 refills | Status: DC | PRN

## 2018-10-13 MED ORDER — LORAZEPAM 0.5 MG PO TABS: 0.5000 mg | ORAL_TABLET | Freq: Every evening | ORAL | 0 refills | Status: DC | PRN

## 2018-10-13 NOTE — Progress Notes (Signed)
REFERRING PROVIDER: Nicholas Lose, MD [place],  [place] [address]  PRIMARY PROVIDER:  Laurey Morale, MD  PRIMARY REASON FOR VISIT:  1. Malignant neoplasm of upper-outer quadrant of left breast in female, estrogen receptor negative ([place])   2. Family history of breast cancer   3. Family history of prostate cancer   4. Family history of stomach cancer     I connected with Ms. Clements on 10/13/2018 at 11:45am EDT by Webex video conference and verified that I am speaking with the correct person using two identifiers.   Patient location: clinic Provider location: clinic   HISTORY OF PRESENT ILLNESS:   Ms. Ferryman, a 73 y.o. female, was seen for a [facility] cancer genetics consultation at the request of Dr. Lindi Adie due to a personal and family history of breast cancer, and a family history of prostate and stomach cancer.  Ms. Hibbard presents to clinic today to discuss the possibility of a hereditary predisposition to cancer, genetic testing, and to further clarify her future cancer risks, as well as potential cancer risks for family members.   In 2020, at the age of 39, Ms. Lucchese was diagnosed with DCIS and IDC, triple negative, of the left breast.   CANCER HISTORY:  Oncology History  Malignant neoplasm of upper-outer quadrant of left breast in female, estrogen receptor negative ([place])  10/12/2018 Initial Diagnosis   Routine screening mammogram detected, in the left breast, a 2.2cm mass at the 2 o'clock position, 2 adjacent nodules spanning 1.4cm at the 1:30 position, and 4 abnormal lymph nodes in the left axilla. Biopsy showed IDC with high grade DCIS, grade 3, HER-2 - (0), ER- 0%, PR- 0%, Ki67 80% in the left breast and axilla.   10/13/2018 Cancer Staging   Staging form: Breast, AJCC 8th Edition - Clinical stage from 10/13/2018: Stage IIIB (cT2, cN1, cM0, G3, ER-, PR-, HER2-) - Signed by Nicholas Lose, MD on 10/13/2018   10/28/2018 -  Chemotherapy   The  patient had DOXOrubicin (ADRIAMYCIN) chemo injection 114 mg, 60 mg/m2 = 114 mg, Intravenous,  Once, 0 of 4 cycles palonosetron (ALOXI) injection 0.25 mg, 0.25 mg, Intravenous,  Once, 0 of 8 cycles pegfilgrastim-jmdb (FULPHILA) injection 6 mg, 6 mg, Subcutaneous,  Once, 0 of 4 cycles CARBOplatin (PARAPLATIN) 360 mg in sodium chloride 0.9 % 100 mL chemo infusion, 360 mg (100 % of original dose 355.6 mg), Intravenous,  Once, 0 of 4 cycles Dose modification:   (original dose 355.6 mg, Cycle 5) cyclophosphamide (CYTOXAN) 1,140 mg in sodium chloride 0.9 % 250 mL chemo infusion, 600 mg/m2 = 1,140 mg, Intravenous,  Once, 0 of 4 cycles PACLitaxel (TAXOL) 150 mg in sodium chloride 0.9 % 250 mL chemo infusion (</= 62m/m2), 80 mg/m2 = 150 mg, Intravenous,  Once, 0 of 4 cycles fosaprepitant (EMEND) 150 mg, dexamethasone (DECADRON) 12 mg in sodium chloride 0.9 % 145 mL IVPB, , Intravenous,  Once, 0 of 8 cycles  for chemotherapy treatment.       RISK FACTORS:  Menarche was at age 89  First live birth at age 73  OCP use for less than 1 year.  Ovaries intact: yes.  Hysterectomy: no.  Menopausal status: postmenopausal, 52.  HRT use: 0 years. Colonoscopy: yes; normal per patient. Mammogram within the last year: yes. Number of breast biopsies: 1. Any excessive radiation exposure in the past: no  Past Medical History:  Diagnosis Date  . Family history of breast cancer   . Family history of  prostate cancer   . Family history of stomach cancer   . Fibroids   . H/O varicella   . History of measles, mumps, or rubella   . Hypertension   . PMB (postmenopausal bleeding) 2005  . Stenotic cervical os 07/08/03  . Stress 06/20/09  . Trichomonas 05/08/03  . Yeast infection     Past Surgical History:  Procedure Laterality Date  . DILATION AND CURETTAGE OF UTERUS  07/08/03  . HYSTEROSCOPY  07/08/03  . INGUINAL HERNIA REPAIR  1996  . TUBAL LIGATION  1979  . WISDOM TOOTH EXTRACTION      Social History    Socioeconomic History  . Marital status: Widowed    Spouse name: Not on file  . Number of children: 2  . Years of education: Not on file  . Highest education level: Not on file  Occupational History  . Not on file  Social Needs  . Financial resource strain: Not on file  . Food insecurity    Worry: Not on file    Inability: Not on file  . Transportation needs    Medical: Not on file    Non-medical: Not on file  Tobacco Use  . Smoking status: Never Smoker  . Smokeless tobacco: Never Used  Substance and Sexual Activity  . Alcohol use: Yes    Comment: occasional glass of wine  . Drug use: No  . Sexual activity: Not Currently    Birth control/protection: Surgical, Post-menopausal    Comment: BTL  Lifestyle  . Physical activity    Days per week: Not on file    Minutes per session: Not on file  . Stress: Not on file  Relationships  . Social Herbalist on phone: Not on file    Gets together: Not on file    Attends religious service: Not on file    Active member of club or organization: Not on file    Attends meetings of clubs or organizations: Not on file    Relationship status: Not on file  Other Topics Concern  . Not on file  Social History Narrative  . Not on file     FAMILY HISTORY:  We obtained a detailed, 4-generation family history.  Significant diagnoses are listed below: Family History  Problem Relation Age of Onset  . Prostate cancer Father 64       not metastatic  . Diabetes Sister   . Hearing loss Son   . Breast cancer Paternal Aunt 46       1 aunt with breast cancer  . Stomach cancer Paternal Grandfather 21    Ms. Housewright has two daughters ages 32 and 16. She has two brothers and two sisters in their 33s and 20s. None of these individuals nor any of her nieces or nephews have had cancer.  Ms. Louis mother died age at 48. She had six maternal uncles and five maternal aunts, and multiple first cousins. Her maternal grandparents died in  their 16s. There are no known cancer diagnoses on the maternal side of the family.  Ms. Gressman father died at age 64. He had prostate cancer diagnosed at age 21, which was not metastatic. She had five paternal uncles, and six paternal aunts. One of her aunts was diagnosed with breast cancer at the age of 109. She has multiple first cousins on this side of the family, none of whom have had cancer. Ms. Leyba paternal grandfather was diagnosed with stomach cancer at age 37,  and her grandmother died at age 29 without a history of cancer.  Ms. Odor is unaware of previous family history of genetic testing for hereditary cancer risks. Patient's maternal ancestors are of Caucasian descent, and paternal ancestors are of [place], [place], and [place] descent. There is no reported Ashkenazi Jewish ancestry. There is no known consanguinity.  GENETIC COUNSELING ASSESSMENT: Ms. Pigford is a 73 y.o. female with a personal and family history of breast cancer and a family history of prostate and stomach cancer, which is not suggestive of a hereditary cancer syndrome. We, therefore, discussed and recommended the following at today's visit.   DISCUSSION: We discussed that 5 - 10% of breast cancer is hereditary, with most cases associated with BRCA1/2.  There are other genes that can be associated with hereditary breast cancer syndromes.  These include ATM, CHEK2, PALB2, etc.  We discussed that identifying a hereditary cancer syndrome is beneficial for several reasons including knowing about other cancer risks, identifying potential screening and risk-reduction options that may be appropriate, and to understand if other family members could be at risk for cancer and allow them to undergo genetic testing.   We reviewed the characteristics, features and inheritance patterns of hereditary cancer syndromes. We also discussed genetic testing, including the appropriate family members to test, the process of  testing, insurance coverage and turn-around-time for results.   We discussed with Ms. Rane that her personal and family history does not meet insurance or NCCN criteria for genetic testing and is not highly consistent with a familial hereditary cancer syndrome.  We feel she is at low risk to harbor a gene mutation associated with such a condition. Ms. Walle is still interested in potentially moving forward with genetic testing and feels that she would benefit from having this knowledge.  If Ms. Zegarra does choose to pursue genetic testing, her out of pocket cost will be $250.  Our contact information was provided so that we can coordinate genetic testing if she chooses to move forward.  In the meantime, we recommended that Ms. Vath continue to follow the cancer screening guidelines given by her primary healthcare provider.   PLAN:  Ms. Olivier did not wish to pursue genetic testing at today's visit, but would like to think about it more before making a decision. We understand this decision, and will plan to reach out in approximately one week to answer any additional questions and coordinate genetic testing if desired. We, therefore, recommend Ms. Jackson continue to follow the cancer screening guidelines given by her primary healthcare provider.  Ms. Leidy questions were answered to her satisfaction today. Our contact information was provided should additional questions or concerns arise. Thank you for the referral and allowing [place] to share in the care of your patient.   Clint Guy, MS, [facility] Certified Genetic Counselor [email or web address] Phone: [phone]  The patient was seen for a total of 20 minutes in face-to-face genetic counseling.  This patient was discussed with Drs. Magrinat, Lindi Adie and/or Burr Medico who agrees with the above.    _______________________________________________________________________ For Office Staff:  Number of people involved in  session: 1 Was an Intern/ student involved with case: no

## 2018-10-13 NOTE — Therapy (Signed)
[place], [place], [address] Phone: [phone]   Fax:  [phone]  Physical Therapy Evaluation  Patient Details  Name: Yolanda Green MRN: [id number] Date of Birth: [birth year] Referring Provider (PT): Dr. Alphonsa Overall   Encounter Date: 10/13/2018  PT End of Session - 10/13/18 1132    Visit Number  1    Number of Visits  1    PT Start Time  7948    PT Stop Time  1100    PT Time Calculation (min)  28 min    Activity Tolerance  Patient tolerated treatment well    Behavior During Therapy  [facility] for tasks assessed/performed       Past Medical History:  Diagnosis Date  . Fibroids   . H/O varicella   . History of measles, mumps, or rubella   . Hypertension   . PMB (postmenopausal bleeding) 2005  . Stenotic cervical os 07/08/03  . Stress 06/20/09  . Trichomonas 05/08/03  . Yeast infection     Past Surgical History:  Procedure Laterality Date  . DILATION AND CURETTAGE OF UTERUS  07/08/03  . HYSTEROSCOPY  07/08/03  . INGUINAL HERNIA REPAIR  1996  . TUBAL LIGATION  1979  . WISDOM TOOTH EXTRACTION      There were no vitals filed for this visit.   Subjective Assessment - 10/13/18 1121    Subjective  Patient reports she is here today to be seen by her medical team for her newly diagnosed left breast cancer.    Pertinent History  Patient was diagnosed on 09/28/2018 with left triple negative invasive ductal carcinoma breast cancer. There are 2 masses measuring 2.2 cm and 1.4 cm in the upper outer quadrant.The Ki67 is 80%. She has 4 abnormal appearing axillary nodes and 1 was biopsied and found to be positive.    Patient Stated Goals  Lymphedema risk reduction and post op shoulder ROM HEP    Currently in Pain?  No/denies         [facility] PT Assessment - 10/13/18 0001      Assessment   Medical Diagnosis  Left breast cancer    Referring Provider (PT)  Dr. Alphonsa Overall    Onset Date/Surgical Date   09/28/18    Hand Dominance  Right    Prior Therapy  none      Precautions   Precautions  Other (comment)    Precaution Comments  active cancer      Restrictions   Weight Bearing Restrictions  No      Balance Screen   Has the patient fallen in the past 6 months  No    Has the patient had a decrease in activity level because of a fear of falling?   No    Is the patient reluctant to leave their home because of a fear of falling?   No      Home Environment   Living Environment  Private residence    Living Arrangements  Alone    Available Help at Discharge  Family      Prior Function   Level of [place]  Retired    Leisure  She walks daily 3 miles which takes her about 1 hours      Cognition   Overall Cognitive Status  Within Functional Limits for tasks assessed      Posture/Postural Control   Posture/Postural Control  Postural limitations    Postural  Limitations  Forward head;Rounded Shoulders      ROM / Strength   AROM / PROM / Strength  AROM;Strength      AROM   AROM Assessment Site  Shoulder;Cervical    Right/Left Shoulder  Right;Left    Right Shoulder Extension  52 Degrees    Right Shoulder Flexion  151 Degrees    Right Shoulder ABduction  150 Degrees    Right Shoulder Internal Rotation  35 Degrees    Right Shoulder External Rotation  79 Degrees    Left Shoulder Extension  58 Degrees    Left Shoulder Flexion  136 Degrees    Left Shoulder ABduction  148 Degrees    Left Shoulder Internal Rotation  42 Degrees    Left Shoulder External Rotation  80 Degrees    Cervical Flexion  WNL    Cervical Extension  WNL    Cervical - Right Side Bend  WNL    Cervical - Left Side Bend  WNL    Cervical - Right Rotation  WNL    Cervical - Left Rotation  WNL      Strength   Overall Strength  Within functional limits for tasks performed        LYMPHEDEMA/ONCOLOGY QUESTIONNAIRE - 10/13/18 1129      Type   Cancer Type  Left breast cancer       Lymphedema Assessments   Lymphedema Assessments  Upper extremities      Right Upper Extremity Lymphedema   10 cm Proximal to Olecranon Process  29.8 cm    Olecranon Process  24.8 cm    10 cm Proximal to Ulnar Styloid Process  21.6 cm    Just Proximal to Ulnar Styloid Process  15.7 cm    Across Hand at [employer]  20.3 cm    At [place] of 2nd Digit  6.2 cm      Left Upper Extremity Lymphedema   10 cm Proximal to Olecranon Process  29 cm    Olecranon Process  24.2 cm    10 cm Proximal to Ulnar Styloid Process  21.3 cm    Just Proximal to Ulnar Styloid Process  15.9 cm    Across Hand at [employer]  19.2 cm    At [place] of 2nd Digit  5.9 cm          Quick Dash - 10/13/18 0001    Open a tight or new jar  No difficulty    Do heavy household chores (wash walls, wash floors)  No difficulty    Carry a shopping bag or briefcase  No difficulty    Wash your back  No difficulty    Use a knife to cut food  No difficulty    Recreational activities in which you take some force or impact through your arm, shoulder, or hand (golf, hammering, tennis)  No difficulty    During the past week, to what extent has your arm, shoulder or hand problem interfered with your normal social activities with family, friends, neighbors, or groups?  Not at all    During the past week, to what extent has your arm, shoulder or hand problem limited your work or other regular daily activities  Not at all    Arm, shoulder, or hand pain.  None    Tingling (pins and needles) in your arm, shoulder, or hand  None    Difficulty Sleeping  No difficulty    DASH Score  0 %  Objective measurements completed on examination: See above findings.          Patient was instructed today in a home exercise program today for post op shoulder range of motion. These included active assist shoulder flexion in sitting, scapular retraction, wall walking with shoulder abduction, and hands behind head external rotation.   She was encouraged to do these twice a day, holding 3 seconds and repeating 5 times when permitted by her physician.        PT Education - 10/13/18 1130    Education Details  Lymphedema risk reduction and post op shoulder ROM HEP    Person(s) Educated  Patient    Methods  Explanation;Demonstration;Handout    Comprehension  Verbalized understanding;Returned demonstration           Breast Clinic Goals - 10/13/18 1143      Patient will be able to verbalize understanding of pertinent lymphedema risk reduction practices relevant to her diagnosis specifically related to skin care.   Time  1    Period  Days    Status  Achieved      Patient will be able to return demonstrate and/or verbalize understanding of the post-op home exercise program related to regaining shoulder range of motion.   Time  1    Period  Days    Status  Achieved      Patient will be able to verbalize understanding of the importance of attending the postoperative After Breast Cancer Class for further lymphedema risk reduction education and therapeutic exercise.   Time  1    Period  Days    Status  Achieved            Plan - 10/13/18 1132    Clinical Impression Statement  Patient was diagnosed on 09/28/2018 with left triple negative invasive ductal carcinoma breast cancer. There are 2 masses measuring 2.2 cm and 1.4 cm in the upper outer quadrant.The Ki67 is 80%. She has 4 abnormal appearing axillary nodes and 1 was biopsied and found to be positive. Her multidisciplinary medical team met prior to her assessments to determine a recommended treatment plan. She is planning to have neoadjuvant chemotherapy followed by a left lumpectomy and axillary lymph node dissection and radiation. She will benefit from post op PT to regain shoulder ROM and function.    Stability/Clinical Decision Making  Stable/Uncomplicated    Clinical Decision Making  Low    Rehab Potential  Excellent    PT Frequency  One time visit     PT Treatment/Interventions  ADLs/Self Care Home Management;Patient/family education;Therapeutic exercise    PT Next Visit Plan  Will reassess after srugery if MD refers    PT Home Exercise Plan  Post op shoulder ROM HEP    Consulted and Agree with Plan of Care  Patient       Patient will benefit from skilled therapeutic intervention in order to improve the following deficits and impairments:  Decreased knowledge of precautions, Pain, Impaired UE functional use, Postural dysfunction, Decreased range of motion  Visit Diagnosis: Malignant neoplasm of upper-outer quadrant of left breast in female, estrogen receptor negative ([employer]) - Plan: PT plan of care cert/re-cert  Abnormal posture - Plan: PT plan of care cert/re-cert   Patient will follow up at outpatient cancer rehab 3-4 weeks following surgery.  If the patient requires physical therapy at that time, a specific plan will be dictated and sent to the referring physician for approval. The patient was educated today on appropriate basic  range of motion exercises to begin post operatively and the importance of attending the After Breast Cancer class following surgery.  Patient was educated today on lymphedema risk reduction practices as it pertains to recommendations that will benefit the patient immediately following surgery.  She verbalized good understanding.     Problem List Patient Active Problem List   Diagnosis Date Noted  . Malignant neoplasm of upper-outer quadrant of left breast in female, estrogen receptor negative ([place]) 10/12/2018  . Essential hypertension 12/23/2006  . [place], HX OF 12/23/2006   Annia Friendly, PT 10/13/18 11:45 AM  [place], [place], [address] Phone: [phone]   Fax:  [phone]  Name: LEXXUS UNDERHILL MRN: [id number] Date of Birth: 05-15-1945

## 2018-10-13 NOTE — Progress Notes (Signed)
START ON PATHWAY REGIMEN - Breast     A cycle is every 14 days (cycles 1-4):     Doxorubicin      Cyclophosphamide      Pegfilgrastim-xxxx    A cycle is every 21 days (cycles 5-8):     Paclitaxel      Carboplatin   **Always confirm dose/schedule in your pharmacy ordering system**  Patient Characteristics: Preoperative or Nonsurgical Candidate (Clinical Staging), Neoadjuvant Therapy followed by Surgery, Invasive Disease, Chemotherapy, HER2 Negative/Unknown/Equivocal, ER Negative/Unknown, Platinum Therapy Indicated Therapeutic Status: Preoperative or Nonsurgical Candidate (Clinical Staging) AJCC M Category: cM0 AJCC Grade: G3 Breast Surgical Plan: Neoadjuvant Therapy followed by Surgery ER Status: Negative (-) AJCC 8 Stage Grouping: IIIB HER2 Status: Negative (-) AJCC T Category: cT2 AJCC N Category: cN1 PR Status: Negative (-) Type of Therapy: Platinum Therapy Indicated Intent of Therapy: Curative Intent, Discussed with Patient 

## 2018-10-13 NOTE — Patient Instructions (Signed)

## 2018-10-13 NOTE — Assessment & Plan Note (Addendum)
10/12/2018:Routine screening mammogram detected, in the left breast, a 2.2cm mass at the 2 o'clock position, 2 adjacent nodules spanning 1.4cm at the 1:30 position, and 4 abnormal lymph nodes in the left axilla. Biopsy showed IDC with high grade DCIS, grade 3, HER-2 - (0), ER- 0%, PR- 0%, Ki67 80% in the left breast and axilla. T2N1 stage IIIb clinical stage  Pathology and radiology counseling: Discussed with the patient, the details of pathology including the type of breast cancer,the clinical staging, the significance of ER, PR and HER-2/neu receptors and the implications for treatment. After reviewing the pathology in detail, we proceeded to discuss the different treatment options between surgery, radiation, chemotherapy, antiestrogen therapies.  Recommendation: 1.  Neoadjuvant chemotherapy with dose dense Adriamycin and Cytoxan followed by Taxol and carboplatin 2.  Breast conserving surgery with axillary lymph node dissection 3.  Adjuvant radiation therapy  Staging scans will be obtained Echocardiogram Chemo class Port placement SWOG 1714 and upbeat clinical trials discussion  Return to clinic in 2 weeks to start chemotherapy

## 2018-10-13 NOTE — Progress Notes (Signed)
[id number]: Referral Dr. Lindi Adie referred patient today while patient in for [facility] appointments. I met with patient briefly after her appointments today. Patient confirms that Dr. Lindi Adie gave her a brief description of the study and patient expressed interest. I gave patient a brief description about the study and the purpose of it as well and provided patient with a study consent and authorization forms for her review. I informed patient that while she is reviewing study for interest in participation I will review for her eligibility. Patient gave verbal understanding, stated she felt overwhelmed with all the information from today. I gave patient my understanding and reminded her that study participation is completely voluntary. All of patient's questions answered to her satisfaction. Patient was thanked for her time and interest in study and was encouraged to call Dr. Lindi Adie or myself with questions. Patient was provided with my contact information. Approximately 10 minutes was spent with patient and reviewing study information with her.  Maxwell Marion, RN, BSN, [facility] Clinical Research 10/13/2018 1:19 PM

## 2018-10-18 ENCOUNTER — Ambulatory Visit (HOSPITAL_COMMUNITY): Admission: RE | Admit: 2018-10-18 | Payer: Medicare Other | Source: Ambulatory Visit

## 2018-10-18 ENCOUNTER — Encounter: Payer: Self-pay | Admitting: Family Medicine

## 2018-10-18 ENCOUNTER — Other Ambulatory Visit: Payer: Self-pay

## 2018-10-18 ENCOUNTER — Telehealth (INDEPENDENT_AMBULATORY_CARE_PROVIDER_SITE_OTHER): Payer: Medicare Other | Admitting: Family Medicine

## 2018-10-18 ENCOUNTER — Telehealth: Payer: Self-pay

## 2018-10-18 DIAGNOSIS — Z171 Estrogen receptor negative status [ER-]: Secondary | ICD-10-CM

## 2018-10-18 DIAGNOSIS — I10 Essential (primary) hypertension: Secondary | ICD-10-CM

## 2018-10-18 DIAGNOSIS — C50412 Malignant neoplasm of upper-outer quadrant of left female breast: Secondary | ICD-10-CM

## 2018-10-18 NOTE — Telephone Encounter (Signed)
Okay for referral?

## 2018-10-18 NOTE — Progress Notes (Signed)
Here to re-establish after an absence of 12 years and to ask for a referral to the [facility] Oncology clinic. She has been seeing Dr. Vinnie Level at [facility] for primary care during these years. On 09-28-18 she had a mammogram which showed suspicious findings in the left breast. She underwent a guided biopsy on 10-07-18 which showed invasive ductal carcinoma with negative HERS markers. She has met with Dr. Lupe Carney in Oncology and Dr. Lowella Dandy in Radiation Oncology. She is scheduled for MRI scans of both breasts later today, for CT scans of the chest, abdomen, and pelvis on 10-20-18 and then a bone scan on 10-22-18. At the insistence of her daughter Colin Mulders, she asks me to refer her to the [place] clinic for a second opinion before any treatment begins. In general she feels fine, but of course she is very anxious about her recent diagnosis.  Virtual Visit via Video Note  I connected with the patient on 10/18/18 at  4:15 PM EDT by a video enabled telemedicine application and verified that I am speaking with the correct person using two identifiers.  Location patient: home Location provider:work or home office Persons participating in the virtual visit: patient, provider  I discussed the limitations of evaluation and management by telemedicine and the availability of in person appointments. The patient expressed understanding and agreed to proceed.   HPI:    ROS: See pertinent positives and negatives per HPI.  Past Medical History:  Diagnosis Date  . Family history of breast cancer   . Family history of prostate cancer   . Family history of stomach cancer   . Fibroids   . H/O varicella   . History of measles, mumps, or rubella   . Hypertension   . PMB (postmenopausal bleeding) 2005  . Stenotic cervical os 07/08/03  . Stress 06/20/09  . Trichomonas 05/08/03  . Yeast infection     Past Surgical History:  Procedure Laterality Date  . DILATION AND CURETTAGE OF UTERUS   07/08/03  . HYSTEROSCOPY  07/08/03  . INGUINAL HERNIA REPAIR  1996  . TUBAL LIGATION  1979  . WISDOM TOOTH EXTRACTION      Family History  Problem Relation Age of Onset  . Prostate cancer Father 71       not metastatic  . Diabetes Sister   . Hearing loss Son   . Breast cancer Paternal Aunt 34       1 aunt with breast cancer  . Stomach cancer Paternal Grandfather 59     Current Outpatient Medications:  .  amLODipine (NORVASC) 10 MG tablet, TAKE 1 TABLET BY MOUTH EVERY DAY, Disp: 30 tablet, Rfl: 0 .  atorvastatin (LIPITOR) 40 MG tablet, Take 1 tablet (40 mg total) by mouth daily., Disp: 90 tablet, Rfl: 1 .  dexamethasone (DECADRON) 4 MG tablet, Take 1 tablets by mouth daily starting the day after Carboplatin and Cytoxan x 2 days. Take with food., Disp: 16 tablet, Rfl: 0 .  lidocaine-prilocaine (EMLA) cream, Apply to affected area once, Disp: 30 g, Rfl: 3 .  LORazepam (ATIVAN) 0.5 MG tablet, Take 1 tablet (0.5 mg total) by mouth at bedtime as needed for sleep., Disp: 30 tablet, Rfl: 0 .  ondansetron (ZOFRAN) 8 MG tablet, Take 1 tablet (8 mg total) by mouth 2 (two) times daily as needed. Start on the third day after chemotherapy., Disp: 30 tablet, Rfl: 1 .  prochlorperazine (COMPAZINE) 10 MG tablet, Take 1 tablet (10 mg total) by  mouth every 6 (six) hours as needed (Nausea or vomiting)., Disp: 30 tablet, Rfl: 1  EXAM:  VITALS per patient if applicable:  GENERAL: alert, oriented, appears well and in no acute distress  HEENT: atraumatic, conjunttiva clear, no obvious abnormalities on inspection of external nose and ears  NECK: normal movements of the head and neck  LUNGS: on inspection no signs of respiratory distress, breathing rate appears normal, no obvious gross SOB, gasping or wheezing  CV: no obvious cyanosis  MS: moves all visible extremities without noticeable abnormality  PSYCH/NEURO: pleasant and cooperative, no obvious depression or anxiety, speech and thought  processing grossly intact  ASSESSMENT AND PLAN: Intro visit to discuss her recent diagnosis of breast cancer and to ask advice. I will gladly refer her to the [facility] at [facility], but I asked her to complete all the preliminary studies mentioned above here in  first. Once all these results are available, then she could have a much more productive discussion at [facility]. She agreed so we will set this up for sometime in the next few weeks. I believe she is also scheduled to see me in person here in my office later this week for other general medical concerns.  Stephen Fry, MD  Discussed the following assessment and plan:  No diagnosis found.     I discussed the assessment and treatment plan with the patient. The patient was provided an opportunity to ask questions and all were answered. The patient agreed with the plan and demonstrated an understanding of the instructions.   The patient was advised to call back or seek an in-person evaluation if the symptoms worsen or if the condition fails to improve as anticipated.   

## 2018-10-18 NOTE — Telephone Encounter (Signed)
Pt has been schedule for a virtual visit today.

## 2018-10-18 NOTE — Telephone Encounter (Signed)
Copied from [place] [id number]. Topic: Referral - Request for Referral >> Oct 15, 2018  3:58 PM Virl Axe D wrote: Has patient seen PCP for this complaint? *If NO, is insurance requiring patient see PCP for this issue before PCP can refer them? Referral for which specialty: Breast Cancer Screening Preferred provider/office:Requesting referral to be sent to both individuals. [facility] Phone [phone] add this location to referral being send to [facility] program at the [facility]) and [place]-Fax [phone](Please mark/include URGENT on the fax to [facility]) Reason for referral: Pt and family would like a second opinion regarding stage 3 breast cancer diagnosis. Pt is scheduled for appt at [facility] on 10/20/18. They are hoping Dr. Sarajane Jews can send a STAT referral as soon as possible. Please contact Daughter Ashontie for any questions

## 2018-10-18 NOTE — Telephone Encounter (Signed)
I will need to see her before I can do any referrals.  I have not seen her in 12 years

## 2018-10-19 ENCOUNTER — Other Ambulatory Visit: Payer: Self-pay | Admitting: Family Medicine

## 2018-10-19 ENCOUNTER — Encounter: Payer: Self-pay | Admitting: *Deleted

## 2018-10-19 ENCOUNTER — Telehealth: Payer: Self-pay | Admitting: *Deleted

## 2018-10-19 DIAGNOSIS — I10 Essential (primary) hypertension: Secondary | ICD-10-CM

## 2018-10-19 NOTE — Telephone Encounter (Signed)
Spoke to pt concerning [place] from 9.30.20. Discussed future appts. Gave instructions and directions. Discussed next steps in tx for scans, followed by port placement and chemo. Pt wishes to have a 2nd opinion and call back with her decision. Pt was given direct contact information to call with her decision. Pt denies questions regarding dx or treatment care plan. Encourage pt to call with needs or concerns.

## 2018-10-20 ENCOUNTER — Ambulatory Visit (INDEPENDENT_AMBULATORY_CARE_PROVIDER_SITE_OTHER): Payer: Medicare Other | Admitting: Family Medicine

## 2018-10-20 ENCOUNTER — Other Ambulatory Visit: Payer: Self-pay

## 2018-10-20 ENCOUNTER — Telehealth: Payer: Self-pay | Admitting: Genetic Counselor

## 2018-10-20 ENCOUNTER — Encounter: Payer: Self-pay | Admitting: Family Medicine

## 2018-10-20 ENCOUNTER — Ambulatory Visit (HOSPITAL_COMMUNITY)
Admission: RE | Admit: 2018-10-20 | Discharge: 2018-10-20 | Disposition: A | Discharge status: Home / Self Care | Payer: Medicare Other | Source: Ambulatory Visit | Attending: Hematology and Oncology | Admitting: Hematology and Oncology

## 2018-10-20 VITALS — BP 120/78 | HR 87 | Temp 98.4°F | Ht 64.5 in | Wt 174.4 lb

## 2018-10-20 DIAGNOSIS — C641 Malignant neoplasm of right kidney, except renal pelvis: Secondary | ICD-10-CM | POA: Diagnosis not present

## 2018-10-20 DIAGNOSIS — I10 Essential (primary) hypertension: Secondary | ICD-10-CM

## 2018-10-20 DIAGNOSIS — N2889 Other specified disorders of kidney and ureter: Secondary | ICD-10-CM | POA: Diagnosis not present

## 2018-10-20 DIAGNOSIS — Z171 Estrogen receptor negative status [ER-]: Secondary | ICD-10-CM

## 2018-10-20 DIAGNOSIS — E785 Hyperlipidemia, unspecified: Secondary | ICD-10-CM | POA: Diagnosis not present

## 2018-10-20 DIAGNOSIS — C50412 Malignant neoplasm of upper-outer quadrant of left female breast: Secondary | ICD-10-CM | POA: Insufficient documentation

## 2018-10-20 DIAGNOSIS — C50912 Malignant neoplasm of unspecified site of left female breast: Secondary | ICD-10-CM | POA: Diagnosis not present

## 2018-10-20 LAB — BASIC METABOLIC PANEL
BUN: 9 mg/dL (ref 6–23)
CO2: 27 mEq/L (ref 19–32)
Calcium: 9.4 mg/dL (ref 8.4–10.5)
Chloride: 103 mEq/L (ref 96–112)
Creatinine, Ser: 0.81 mg/dL (ref 0.40–1.20)
GFR: 84.33 mL/min (ref >60.00)
Glucose, Bld: 87 mg/dL (ref 70–99)
Potassium: 4.2 mEq/L (ref 3.5–5.1)
Sodium: 138 mEq/L (ref 135–145)

## 2018-10-20 LAB — TSH: TSH: 1.27 u[IU]/mL (ref 0.35–4.50)

## 2018-10-20 LAB — CBC WITH DIFFERENTIAL/PLATELET
Basophils Absolute: 0 10*3/uL (ref 0.0–0.1)
Basophils Relative: 0.4 % (ref 0.0–3.0)
Eosinophils Absolute: 0.1 10*3/uL (ref 0.0–0.7)
Eosinophils Relative: 1 % (ref 0.0–5.0)
HCT: 42.5 % (ref 36.0–46.0)
Hemoglobin: 14.4 g/dL (ref 12.0–15.0)
Lymphocytes Relative: 36.3 % (ref 12.0–46.0)
Lymphs Abs: 2.4 10*3/uL (ref 0.7–4.0)
MCHC: 33.9 g/dL (ref 30.0–36.0)
MCV: 98 fl (ref 78.0–100.0)
Monocytes Absolute: 0.3 10*3/uL (ref 0.1–1.0)
Monocytes Relative: 5 % (ref 3.0–12.0)
Neutro Abs: 3.8 10*3/uL (ref 1.4–7.7)
Neutrophils Relative %: 57.3 % (ref 43.0–77.0)
Platelets: 238 10*3/uL (ref 150.0–400.0)
RBC: 4.34 Mil/uL (ref 3.87–5.11)
RDW: 13.1 % (ref 11.5–15.5)
WBC: 6.6 10*3/uL (ref 4.0–10.5)

## 2018-10-20 LAB — LIPID PANEL
Cholesterol: 141 mg/dL (ref 0–200)
HDL: 49.2 mg/dL (ref >39.00)
LDL Cholesterol: 80 mg/dL (ref 0–99)
NonHDL: 92.14
Total CHOL/HDL Ratio: 3
Triglycerides: 63 mg/dL (ref 0.0–149.0)
VLDL: 12.6 mg/dL (ref 0.0–40.0)

## 2018-10-20 LAB — HEPATIC FUNCTION PANEL
ALT: 21 U/L (ref 0–35)
AST: 21 U/L (ref 0–37)
Albumin: 4 g/dL (ref 3.5–5.2)
Alkaline Phosphatase: 84 U/L (ref 39–117)
Bilirubin, Direct: 0.1 mg/dL (ref 0.0–0.3)
Total Bilirubin: 0.6 mg/dL (ref 0.2–1.2)
Total Protein: 7.4 g/dL (ref 6.0–8.3)

## 2018-10-20 MED ORDER — IOHEXOL 300 MG/ML  SOLN
100.0000 mL | Freq: Once | INTRAMUSCULAR | Status: AC | PRN
  Administered 2018-10-20: 100 mL via INTRAVENOUS

## 2018-10-20 MED ORDER — SODIUM CHLORIDE (PF) 0.9 % IJ SOLN
INTRAMUSCULAR | Status: AC
  Filled 2018-10-20: qty 50

## 2018-10-20 NOTE — Telephone Encounter (Signed)
Discussed with Yolanda Green her thoughts on pursuing genetic testing since we last spoke. She has decided to not pursue genetic testing at this point in time because there is a relatively low likelihood that her breast cancer is due to a hereditary cause. We understand this decision and remain available to coordinate genetic testing at any point in the future, should she change her mind.

## 2018-10-20 NOTE — Progress Notes (Signed)
Subjective:    Patient ID: Yolanda Green, female    DOB: 1945/12/28, 73 y.o.   MRN: [id number]  HPI Here to discuss several issues. As noted in our virtual visit the other day, she is being worked up for breast cancer by Dr. Sonny Dandy. She had CT scans this morning of the chest, abdomen, and pelvis and these showed a left lateral breast nodule, left axillary adenopathy, and an 8 mm lesion in the upper pole of the right kidney that likely represents a cyst, but the small size of it makes it difficult to tell. It was recommended that she had an MRI to further characterize this lesion. She has an MRI of the breasts set for tomorrow and a bone scan for 10-22-18. Per her request we have referred her to [facility] for a second opinion about her diagnosis and treatment plan, and this is scheduled for 10-28-18. Currently her daughter, who lives in [place], is staying with her to help her through this process. In general she feels fine and has no complaints. Her BP is stable. She has had a flu shot and a pneumonia vaccine recently.   Review of Systems  Constitutional: Negative.   HENT: Negative.   Eyes: Negative.   Respiratory: Negative.   Cardiovascular: Negative.   Gastrointestinal: Negative.   Genitourinary: Negative for decreased urine volume, difficulty urinating, dyspareunia, dysuria, enuresis, flank pain, frequency, hematuria, pelvic pain and urgency.  Musculoskeletal: Negative.   Skin: Negative.   Neurological: Negative.   Psychiatric/Behavioral: Negative.        Objective:   Physical Exam Constitutional:      General: She is not in acute distress.    Appearance: She is well-developed.  HENT:     Head: Normocephalic and atraumatic.     Right Ear: External ear normal.     Left Ear: External ear normal.     Nose: Nose normal.     Mouth/Throat:     Pharynx: No oropharyngeal exudate.  Eyes:     General: No scleral icterus.    Conjunctiva/sclera: Conjunctivae normal.     Pupils:  Pupils are equal, round, and reactive to light.  Neck:     Musculoskeletal: Normal range of motion and neck supple.     Thyroid: No thyromegaly.     Vascular: No JVD.  Cardiovascular:     Rate and Rhythm: Normal rate and regular rhythm.     Heart sounds: Normal heart sounds. No murmur. No friction rub. No gallop.   Pulmonary:     Effort: Pulmonary effort is normal. No respiratory distress.     Breath sounds: Normal breath sounds. No wheezing or rales.  Chest:     Chest wall: No tenderness.  Abdominal:     General: Bowel sounds are normal. There is no distension.     Palpations: Abdomen is soft. There is no mass.     Tenderness: There is no abdominal tenderness. There is no guarding or rebound.  Musculoskeletal: Normal range of motion.        General: No tenderness.  Lymphadenopathy:     Cervical: No cervical adenopathy.  Skin:    General: Skin is warm and dry.     Findings: No erythema or rash.  Neurological:     Mental Status: She is alert and oriented to person, place, and time.     Cranial Nerves: No cranial nerve deficit.     Motor: No abnormal muscle tone.     Coordination: Coordination normal.  Deep Tendon Reflexes: Reflexes are normal and symmetric. Reflexes normal.  Psychiatric:        Behavior: Behavior normal.        Thought Content: Thought content normal.        Judgment: Judgment normal.           Assessment & Plan:  She seems to be doing well other than the breast cancer. She will continue the workup as planned. Her HTN is stable. We will get fasting labs today to check her lipids, etc. We will also arrange to have her medical records sent to [place]. Alysia Penna, MD

## 2018-10-21 ENCOUNTER — Telehealth: Payer: Self-pay | Admitting: Family Medicine

## 2018-10-21 ENCOUNTER — Ambulatory Visit (HOSPITAL_COMMUNITY)
Admission: RE | Admit: 2018-10-21 | Discharge: 2018-10-21 | Disposition: A | Discharge status: Home / Self Care | Payer: Medicare Other | Source: Ambulatory Visit | Attending: Hematology and Oncology | Admitting: Hematology and Oncology

## 2018-10-21 ENCOUNTER — Telehealth: Payer: Self-pay | Admitting: *Deleted

## 2018-10-21 DIAGNOSIS — Z171 Estrogen receptor negative status [ER-]: Secondary | ICD-10-CM

## 2018-10-21 DIAGNOSIS — C50412 Malignant neoplasm of upper-outer quadrant of left female breast: Secondary | ICD-10-CM | POA: Diagnosis not present

## 2018-10-21 DIAGNOSIS — C50912 Malignant neoplasm of unspecified site of left female breast: Secondary | ICD-10-CM | POA: Diagnosis not present

## 2018-10-21 MED ORDER — GADOBUTROL 1 MMOL/ML IV SOLN
8.0000 mL | Freq: Once | INTRAVENOUS | Status: AC | PRN
  Administered 2018-10-21: 8 mL via INTRAVENOUS

## 2018-10-21 NOTE — Telephone Encounter (Signed)
Noted. Please let her know I appreciate the opportunity to care for her. Please forward medical records to Dr. Sarajane Jews at [facility].

## 2018-10-21 NOTE — Telephone Encounter (Signed)
"  Yolanda Green [phone]).  Voicemail received earlier says to call Triage for questions.  Am I able to eat before my MRI today at 12:30 pm?  Radiology has not called me.  Have only talked with [name] who scheduled MRI."    No prep for breast MRI, you are able to eat and drink.   "I'm not having MRI to my entire body today?" Order reads specific area for MRI, breast, bilateral, with and without contrast. Currently denies further questions.

## 2018-10-21 NOTE — Telephone Encounter (Signed)
Copied from [place] [id number]. Topic: General - Other >> Oct 21, 2018  9:34 AM Yvette Rack wrote: Reason for CRM: Pt called to make Dr. Carlota Raspberry aware that she will be going to [facility] due to the location. Pt stated [place] is only a few miles from her home and she would like to wish Dr. Carlota Raspberry much success and blessings.

## 2018-10-22 ENCOUNTER — Other Ambulatory Visit: Payer: Self-pay

## 2018-10-22 ENCOUNTER — Inpatient Hospital Stay: Payer: Medicare Other | Attending: Hematology and Oncology

## 2018-10-22 ENCOUNTER — Encounter (HOSPITAL_COMMUNITY)
Admission: RE | Admit: 2018-10-22 | Discharge: 2018-10-22 | Disposition: A | Discharge status: Home / Self Care | Payer: Medicare Other | Source: Ambulatory Visit | Attending: Hematology and Oncology | Admitting: Hematology and Oncology

## 2018-10-22 ENCOUNTER — Encounter: Payer: Self-pay | Admitting: *Deleted

## 2018-10-22 ENCOUNTER — Other Ambulatory Visit: Payer: Self-pay | Admitting: Hematology and Oncology

## 2018-10-22 ENCOUNTER — Ambulatory Visit (HOSPITAL_COMMUNITY)
Admission: RE | Admit: 2018-10-22 | Discharge: 2018-10-22 | Disposition: A | Discharge status: Home / Self Care | Payer: Medicare Other | Source: Ambulatory Visit | Attending: Hematology and Oncology | Admitting: Hematology and Oncology

## 2018-10-22 ENCOUNTER — Telehealth: Payer: Self-pay | Admitting: *Deleted

## 2018-10-22 DIAGNOSIS — Z171 Estrogen receptor negative status [ER-]: Secondary | ICD-10-CM

## 2018-10-22 DIAGNOSIS — C50412 Malignant neoplasm of upper-outer quadrant of left female breast: Secondary | ICD-10-CM | POA: Diagnosis not present

## 2018-10-22 DIAGNOSIS — C50919 Malignant neoplasm of unspecified site of unspecified female breast: Secondary | ICD-10-CM | POA: Diagnosis not present

## 2018-10-22 DIAGNOSIS — I080 Rheumatic disorders of both mitral and aortic valves: Secondary | ICD-10-CM | POA: Diagnosis not present

## 2018-10-22 MED ORDER — TECHNETIUM TC 99M MEDRONATE IV KIT
19.2000 | PACK | Freq: Once | INTRAVENOUS | Status: AC | PRN
  Administered 2018-10-22: 19.2 via INTRAVENOUS

## 2018-10-22 NOTE — Progress Notes (Signed)
  Echocardiogram 2D Echocardiogram has been performed.  Yolanda Green R 10/22/2018, 11:09 AM

## 2018-10-22 NOTE — Progress Notes (Signed)
10/22/18 at 1:50pm - Research/BlueStar pt- The pt was screened and felt to be a good candidate for the BlueStar study.  The pt was into the cancer center to meet with the chemo education nurse.  The research nurse asked the chemo nurse educator to ask the pt if she was interested in learning more about the research blood study.  Research nurse was notified that the pt was not interested in the study b/c she was too overwhelmed with her diagnosis and upcoming treatment.   Brion Aliment RN, BSN, CCRP Clinical Research Nurse 10/22/2018 1:54 PM

## 2018-10-22 NOTE — Telephone Encounter (Signed)
Called pt and discussed breast MRI results and recommendations to bx non mass enhancement for extent of dz. Orders placed. Msg sent to GI scheduling. Informed pt she will receive a call with an appt date and time.

## 2018-10-25 ENCOUNTER — Encounter: Payer: Self-pay | Admitting: *Deleted

## 2018-10-25 ENCOUNTER — Other Ambulatory Visit (HOSPITAL_COMMUNITY): Payer: 59

## 2018-10-25 ENCOUNTER — Telehealth: Payer: Self-pay | Admitting: Hematology and Oncology

## 2018-10-25 NOTE — Telephone Encounter (Signed)
I discussed the results of the CT scans and the breast MRI and the bone scan with the patient.  There is no evidence of metastatic disease.  The MRI showed a large area of non-mass enhancement which will need to be biopsied.  I discussed with her if all of that is cancer then she will need a mastectomy. Patient understands the significance of this biopsy and is willing to proceed.

## 2018-10-27 DIAGNOSIS — C50912 Malignant neoplasm of unspecified site of left female breast: Secondary | ICD-10-CM | POA: Diagnosis not present

## 2018-10-27 DIAGNOSIS — Z1231 Encounter for screening mammogram for malignant neoplasm of breast: Secondary | ICD-10-CM | POA: Diagnosis not present

## 2018-10-28 DIAGNOSIS — C50812 Malignant neoplasm of overlapping sites of left female breast: Secondary | ICD-10-CM | POA: Diagnosis not present

## 2018-10-28 DIAGNOSIS — R928 Other abnormal and inconclusive findings on diagnostic imaging of breast: Secondary | ICD-10-CM | POA: Diagnosis not present

## 2018-10-28 DIAGNOSIS — Z171 Estrogen receptor negative status [ER-]: Secondary | ICD-10-CM | POA: Diagnosis not present

## 2018-10-28 DIAGNOSIS — C50912 Malignant neoplasm of unspecified site of left female breast: Secondary | ICD-10-CM | POA: Diagnosis not present

## 2018-10-29 ENCOUNTER — Telehealth: Payer: Self-pay | Admitting: *Deleted

## 2018-10-29 ENCOUNTER — Telehealth: Payer: Self-pay

## 2018-10-29 NOTE — Telephone Encounter (Signed)
RN returned call, pt asking specially to speak with Dr. Lindi Adie.  MD out of office, RN offered assistance.  Pt reports she will call back on Monday as she requests to speak with him directly.

## 2018-10-29 NOTE — Telephone Encounter (Signed)
Left vm requesting return call regarding appt with [facility] and if she has decided where she would like to receive treatment. Contact information provided.

## 2018-11-01 ENCOUNTER — Telehealth: Payer: Self-pay | Admitting: *Deleted

## 2018-11-01 ENCOUNTER — Other Ambulatory Visit: Payer: Self-pay | Admitting: Family Medicine

## 2018-11-01 DIAGNOSIS — I10 Essential (primary) hypertension: Secondary | ICD-10-CM

## 2018-11-01 NOTE — Telephone Encounter (Signed)
Received vm from pt stating she has decided to have her treatment at [facility] as her daughter is in [place] and will be able to take care of her during her treatments. Return pt call, left vm stating msg has been received and for pt to call in the future with any questions or needs. Contact information provied. Physician team notified

## 2018-11-02 DIAGNOSIS — C50812 Malignant neoplasm of overlapping sites of left female breast: Secondary | ICD-10-CM | POA: Diagnosis not present

## 2018-11-02 DIAGNOSIS — C50912 Malignant neoplasm of unspecified site of left female breast: Secondary | ICD-10-CM | POA: Diagnosis not present

## 2018-11-02 DIAGNOSIS — Z452 Encounter for adjustment and management of vascular access device: Secondary | ICD-10-CM | POA: Diagnosis not present

## 2018-11-02 DIAGNOSIS — Z171 Estrogen receptor negative status [ER-]: Secondary | ICD-10-CM | POA: Diagnosis not present

## 2018-11-03 DIAGNOSIS — N289 Disorder of kidney and ureter, unspecified: Secondary | ICD-10-CM | POA: Diagnosis not present

## 2018-11-03 DIAGNOSIS — C50812 Malignant neoplasm of overlapping sites of left female breast: Secondary | ICD-10-CM | POA: Diagnosis not present

## 2018-11-03 DIAGNOSIS — K7689 Other specified diseases of liver: Secondary | ICD-10-CM | POA: Diagnosis not present

## 2018-11-04 DIAGNOSIS — Z5111 Encounter for antineoplastic chemotherapy: Secondary | ICD-10-CM | POA: Diagnosis not present

## 2018-11-04 DIAGNOSIS — C773 Secondary and unspecified malignant neoplasm of axilla and upper limb lymph nodes: Secondary | ICD-10-CM | POA: Diagnosis not present

## 2018-11-04 DIAGNOSIS — C50812 Malignant neoplasm of overlapping sites of left female breast: Secondary | ICD-10-CM | POA: Diagnosis not present

## 2018-11-04 DIAGNOSIS — N2889 Other specified disorders of kidney and ureter: Secondary | ICD-10-CM | POA: Diagnosis not present

## 2018-11-04 DIAGNOSIS — Z171 Estrogen receptor negative status [ER-]: Secondary | ICD-10-CM | POA: Diagnosis not present

## 2018-11-04 DIAGNOSIS — K769 Liver disease, unspecified: Secondary | ICD-10-CM | POA: Diagnosis not present

## 2018-11-04 DIAGNOSIS — Z780 Asymptomatic menopausal state: Secondary | ICD-10-CM | POA: Diagnosis not present

## 2018-11-05 ENCOUNTER — Other Ambulatory Visit: Payer: 59

## 2018-11-05 ENCOUNTER — Telehealth: Payer: Self-pay | Admitting: Adult Health

## 2018-11-05 DIAGNOSIS — N2889 Other specified disorders of kidney and ureter: Secondary | ICD-10-CM

## 2018-11-05 NOTE — Telephone Encounter (Signed)
Spoke  to Amy from [facility] Radiology interpretation of CT scan done on 10/21/2018.  [facility] radiology wanted to make sure that the PCP was aware of a 7 mm indeterminate right renal lesion.  They recommend no focal renal cyst protocol CT further evaluation if warranted.  Amy will pass this along to [facility] oncology team as they are the ones treating the patient for breast cancer.  She was not 100% sure if they would do anything with the renal lesion and she wanted PCP to be aware

## 2018-11-08 NOTE — Addendum Note (Signed)
Addended by: Alysia Penna A on: 11/08/2018 04:08 PM   Modules accepted: Orders

## 2018-11-08 NOTE — Telephone Encounter (Signed)
I ordered an MRI to look at the kidney lesion

## 2018-11-11 ENCOUNTER — Inpatient Hospital Stay: Admission: RE | Admit: 2018-11-11 | Payer: 59 | Source: Ambulatory Visit

## 2018-11-12 ENCOUNTER — Ambulatory Visit
Admission: RE | Admit: 2018-11-12 | Discharge: 2018-11-12 | Disposition: A | Discharge status: Home / Self Care | Payer: Medicare Other | Source: Ambulatory Visit | Attending: Family Medicine | Admitting: Family Medicine

## 2018-11-12 ENCOUNTER — Other Ambulatory Visit: Payer: Self-pay

## 2018-11-12 DIAGNOSIS — N2889 Other specified disorders of kidney and ureter: Secondary | ICD-10-CM

## 2018-11-12 DIAGNOSIS — K862 Cyst of pancreas: Secondary | ICD-10-CM | POA: Diagnosis not present

## 2018-11-12 MED ORDER — GADOBENATE DIMEGLUMINE 529 MG/ML IV SOLN
15.0000 mL | Freq: Once | INTRAVENOUS | Status: AC | PRN
  Administered 2018-11-12: 15 mL via INTRAVENOUS

## 2018-11-12 NOTE — Addendum Note (Signed)
Addended by: Alysia Penna A on: 11/12/2018 01:56 PM   Modules accepted: Orders

## 2018-11-15 ENCOUNTER — Other Ambulatory Visit (HOSPITAL_COMMUNITY): Payer: 59

## 2018-11-18 ENCOUNTER — Ambulatory Visit: Admit: 2018-11-18 | Payer: 59 | Admitting: Surgery

## 2018-11-18 DIAGNOSIS — Z79810 Long term (current) use of selective estrogen receptor modulators (SERMs): Secondary | ICD-10-CM | POA: Diagnosis not present

## 2018-11-18 DIAGNOSIS — Z5111 Encounter for antineoplastic chemotherapy: Secondary | ICD-10-CM | POA: Diagnosis not present

## 2018-11-18 DIAGNOSIS — C50812 Malignant neoplasm of overlapping sites of left female breast: Secondary | ICD-10-CM | POA: Diagnosis not present

## 2018-11-18 DIAGNOSIS — C773 Secondary and unspecified malignant neoplasm of axilla and upper limb lymph nodes: Secondary | ICD-10-CM | POA: Diagnosis not present

## 2018-11-18 DIAGNOSIS — C50412 Malignant neoplasm of upper-outer quadrant of left female breast: Secondary | ICD-10-CM | POA: Diagnosis not present

## 2018-11-18 DIAGNOSIS — Z171 Estrogen receptor negative status [ER-]: Secondary | ICD-10-CM | POA: Diagnosis not present

## 2018-11-18 SURGERY — INSERTION, TUNNELED CENTRAL VENOUS DEVICE, WITH PORT
Anesthesia: General | Site: Breast

## 2018-11-19 NOTE — Telephone Encounter (Signed)
Copied from [place] [id number]. Topic: General - Inquiry >> Nov 10, 2018 11:50 AM Mathis Bud wrote: Reason for CRM: Patient is requesting a call back from PCP nurse in references to her chemo treatment.  Call back [phone]

## 2018-11-19 NOTE — Telephone Encounter (Signed)
Spoke to pt and she stated she does not have any question about Chemo. No further action needed!

## 2018-12-01 ENCOUNTER — Other Ambulatory Visit: Payer: 59

## 2018-12-02 DIAGNOSIS — Z171 Estrogen receptor negative status [ER-]: Secondary | ICD-10-CM | POA: Diagnosis not present

## 2018-12-02 DIAGNOSIS — Z780 Asymptomatic menopausal state: Secondary | ICD-10-CM | POA: Diagnosis not present

## 2018-12-02 DIAGNOSIS — C773 Secondary and unspecified malignant neoplasm of axilla and upper limb lymph nodes: Secondary | ICD-10-CM | POA: Diagnosis not present

## 2018-12-02 DIAGNOSIS — Z5111 Encounter for antineoplastic chemotherapy: Secondary | ICD-10-CM | POA: Diagnosis not present

## 2018-12-02 DIAGNOSIS — C50412 Malignant neoplasm of upper-outer quadrant of left female breast: Secondary | ICD-10-CM | POA: Diagnosis not present

## 2018-12-02 DIAGNOSIS — C50812 Malignant neoplasm of overlapping sites of left female breast: Secondary | ICD-10-CM | POA: Diagnosis not present

## 2018-12-04 ENCOUNTER — Other Ambulatory Visit: Payer: 59

## 2018-12-16 DIAGNOSIS — Z171 Estrogen receptor negative status [ER-]: Secondary | ICD-10-CM | POA: Diagnosis not present

## 2018-12-16 DIAGNOSIS — C50412 Malignant neoplasm of upper-outer quadrant of left female breast: Secondary | ICD-10-CM | POA: Diagnosis not present

## 2018-12-16 DIAGNOSIS — Z8042 Family history of malignant neoplasm of prostate: Secondary | ICD-10-CM | POA: Diagnosis not present

## 2018-12-16 DIAGNOSIS — C50812 Malignant neoplasm of overlapping sites of left female breast: Secondary | ICD-10-CM | POA: Diagnosis not present

## 2018-12-16 DIAGNOSIS — N289 Disorder of kidney and ureter, unspecified: Secondary | ICD-10-CM | POA: Diagnosis not present

## 2018-12-16 DIAGNOSIS — C773 Secondary and unspecified malignant neoplasm of axilla and upper limb lymph nodes: Secondary | ICD-10-CM | POA: Diagnosis not present

## 2018-12-16 DIAGNOSIS — Z5111 Encounter for antineoplastic chemotherapy: Secondary | ICD-10-CM | POA: Diagnosis not present

## 2018-12-16 DIAGNOSIS — Z803 Family history of malignant neoplasm of breast: Secondary | ICD-10-CM | POA: Diagnosis not present

## 2018-12-16 DIAGNOSIS — Z79899 Other long term (current) drug therapy: Secondary | ICD-10-CM | POA: Diagnosis not present

## 2018-12-16 DIAGNOSIS — Z800 Family history of malignant neoplasm of digestive organs: Secondary | ICD-10-CM | POA: Diagnosis not present

## 2018-12-23 DIAGNOSIS — N2889 Other specified disorders of kidney and ureter: Secondary | ICD-10-CM | POA: Diagnosis not present

## 2018-12-30 ENCOUNTER — Other Ambulatory Visit: Payer: Self-pay | Admitting: Family Medicine

## 2018-12-30 DIAGNOSIS — E785 Hyperlipidemia, unspecified: Secondary | ICD-10-CM

## 2019-01-20 DIAGNOSIS — Z780 Asymptomatic menopausal state: Secondary | ICD-10-CM | POA: Diagnosis not present

## 2019-01-20 DIAGNOSIS — C50812 Malignant neoplasm of overlapping sites of left female breast: Secondary | ICD-10-CM | POA: Diagnosis not present

## 2019-01-20 DIAGNOSIS — Z5111 Encounter for antineoplastic chemotherapy: Secondary | ICD-10-CM | POA: Diagnosis not present

## 2019-01-20 DIAGNOSIS — Z171 Estrogen receptor negative status [ER-]: Secondary | ICD-10-CM | POA: Diagnosis not present

## 2019-01-27 DIAGNOSIS — C50812 Malignant neoplasm of overlapping sites of left female breast: Secondary | ICD-10-CM | POA: Diagnosis not present

## 2019-01-27 DIAGNOSIS — Z171 Estrogen receptor negative status [ER-]: Secondary | ICD-10-CM | POA: Diagnosis not present

## 2019-01-27 DIAGNOSIS — Z5111 Encounter for antineoplastic chemotherapy: Secondary | ICD-10-CM | POA: Diagnosis not present

## 2019-01-27 DIAGNOSIS — R590 Localized enlarged lymph nodes: Secondary | ICD-10-CM | POA: Diagnosis not present

## 2019-02-03 DIAGNOSIS — Z5111 Encounter for antineoplastic chemotherapy: Secondary | ICD-10-CM | POA: Diagnosis not present

## 2019-02-03 DIAGNOSIS — C50812 Malignant neoplasm of overlapping sites of left female breast: Secondary | ICD-10-CM | POA: Diagnosis not present

## 2019-02-03 DIAGNOSIS — Z171 Estrogen receptor negative status [ER-]: Secondary | ICD-10-CM | POA: Diagnosis not present

## 2019-02-11 DIAGNOSIS — C50812 Malignant neoplasm of overlapping sites of left female breast: Secondary | ICD-10-CM | POA: Diagnosis not present

## 2019-02-11 DIAGNOSIS — Z5111 Encounter for antineoplastic chemotherapy: Secondary | ICD-10-CM | POA: Diagnosis not present

## 2019-02-11 DIAGNOSIS — Z171 Estrogen receptor negative status [ER-]: Secondary | ICD-10-CM | POA: Diagnosis not present

## 2019-02-17 DIAGNOSIS — Z780 Asymptomatic menopausal state: Secondary | ICD-10-CM | POA: Diagnosis not present

## 2019-02-17 DIAGNOSIS — Z5111 Encounter for antineoplastic chemotherapy: Secondary | ICD-10-CM | POA: Diagnosis not present

## 2019-02-17 DIAGNOSIS — Z79810 Long term (current) use of selective estrogen receptor modulators (SERMs): Secondary | ICD-10-CM | POA: Diagnosis not present

## 2019-02-17 DIAGNOSIS — C50812 Malignant neoplasm of overlapping sites of left female breast: Secondary | ICD-10-CM | POA: Diagnosis not present

## 2019-02-17 DIAGNOSIS — Z171 Estrogen receptor negative status [ER-]: Secondary | ICD-10-CM | POA: Diagnosis not present

## 2019-02-24 DIAGNOSIS — C50812 Malignant neoplasm of overlapping sites of left female breast: Secondary | ICD-10-CM | POA: Diagnosis not present

## 2019-02-24 DIAGNOSIS — Z171 Estrogen receptor negative status [ER-]: Secondary | ICD-10-CM | POA: Diagnosis not present

## 2019-02-24 DIAGNOSIS — Z5111 Encounter for antineoplastic chemotherapy: Secondary | ICD-10-CM | POA: Diagnosis not present

## 2019-03-03 ENCOUNTER — Other Ambulatory Visit: Payer: Self-pay | Admitting: Family Medicine

## 2019-03-03 DIAGNOSIS — E785 Hyperlipidemia, unspecified: Secondary | ICD-10-CM

## 2019-03-04 DIAGNOSIS — Z5111 Encounter for antineoplastic chemotherapy: Secondary | ICD-10-CM | POA: Diagnosis not present

## 2019-03-04 DIAGNOSIS — Z171 Estrogen receptor negative status [ER-]: Secondary | ICD-10-CM | POA: Diagnosis not present

## 2019-03-04 DIAGNOSIS — C50812 Malignant neoplasm of overlapping sites of left female breast: Secondary | ICD-10-CM | POA: Diagnosis not present

## 2019-03-08 ENCOUNTER — Encounter: Payer: Self-pay | Admitting: Family Medicine

## 2019-03-08 ENCOUNTER — Other Ambulatory Visit: Payer: Self-pay

## 2019-03-08 ENCOUNTER — Ambulatory Visit (INDEPENDENT_AMBULATORY_CARE_PROVIDER_SITE_OTHER): Payer: Medicare Other | Admitting: Family Medicine

## 2019-03-08 VITALS — BP 140/60 | HR 112 | Temp 98.3°F | Wt 172.2 lb

## 2019-03-08 DIAGNOSIS — R1013 Epigastric pain: Secondary | ICD-10-CM

## 2019-03-08 MED ORDER — OMEPRAZOLE 40 MG PO CPDR: 40.0000 mg | DELAYED_RELEASE_CAPSULE | Freq: Every day | ORAL | 3 refills | Status: DC

## 2019-03-08 NOTE — Progress Notes (Signed)
   Subjective:    Patient ID: Yolanda Green, female    DOB: November 14, 1945, 74 y.o.   MRN: [id number]  HPI Here for 3 weeks of a dull pain and tightness in the upper abdomen all the way across just under the ribs. This is not severe but it is annoying. It is present most of the time, though is may go away for a few hours. There is no nausea or sweats or SOB associated with it. It is not related to exertion. She denies any heartburn or indigestion. Deep breathing or twisting her trunk do not affect the pain. Her appetite is normal. Her BMs are normal. She notes she is currently getting chemotherapy for breast cancer.   Review of Systems  Constitutional: Negative.   Respiratory: Negative.   Cardiovascular: Negative.   Gastrointestinal: Positive for abdominal pain. Negative for abdominal distention, anal bleeding, blood in stool, constipation, diarrhea, nausea, rectal pain and vomiting.  Genitourinary: Negative.        Objective:   Physical Exam Constitutional:      General: She is not in acute distress.    Appearance: Normal appearance. She is well-developed.  Cardiovascular:     Rate and Rhythm: Normal rate and regular rhythm.     Pulses: Normal pulses.     Heart sounds: Normal heart sounds.  Pulmonary:     Effort: Pulmonary effort is normal.     Breath sounds: Normal breath sounds.  Chest:     Chest wall: No tenderness.  Abdominal:     General: Abdomen is flat. Bowel sounds are normal. There is no distension.     Palpations: Abdomen is soft. There is no mass.     Tenderness: There is no abdominal tenderness. There is no guarding or rebound.     Hernia: No hernia is present.  Musculoskeletal:     Cervical back: No rigidity.  Lymphadenopathy:     Cervical: No cervical adenopathy.  Neurological:     General: No focal deficit present.     Mental Status: She is alert and oriented to person, place, and time.           Assessment & Plan:  Upper abdominal pain consistent  with either gastritis or duodenitis. She will try Omeprazole 40 mg daily. Recheck in 2 weeks. Alysia Penna, MD

## 2019-03-09 ENCOUNTER — Telehealth: Payer: Self-pay | Admitting: Family Medicine

## 2019-03-09 NOTE — Telephone Encounter (Signed)
pt  prescribed omeprazole (PRILOSEC) 40 MG capsule  took medication last night  and morning having a pressure squeezing in her chest/ abdomen  area  does not think the medicine is working; please advise  contact number  [phone]

## 2019-03-09 NOTE — Telephone Encounter (Signed)
Please advise  Send to Lindsay's basket please

## 2019-03-10 DIAGNOSIS — C50812 Malignant neoplasm of overlapping sites of left female breast: Secondary | ICD-10-CM | POA: Diagnosis not present

## 2019-03-10 DIAGNOSIS — Z171 Estrogen receptor negative status [ER-]: Secondary | ICD-10-CM | POA: Diagnosis not present

## 2019-03-10 DIAGNOSIS — Z5111 Encounter for antineoplastic chemotherapy: Secondary | ICD-10-CM | POA: Diagnosis not present

## 2019-03-10 NOTE — Telephone Encounter (Signed)
Left message for patient to call back  

## 2019-03-10 NOTE — Telephone Encounter (Signed)
It is a little too soon to see the benefits. Stay on Omeprazole 40 mg each morning but add Pepcid 20 mg (OTC) each evening

## 2019-03-13 ENCOUNTER — Other Ambulatory Visit: Payer: Self-pay | Admitting: Family Medicine

## 2019-03-13 DIAGNOSIS — I10 Essential (primary) hypertension: Secondary | ICD-10-CM

## 2019-03-17 ENCOUNTER — Other Ambulatory Visit: Payer: Self-pay

## 2019-03-17 DIAGNOSIS — Z5111 Encounter for antineoplastic chemotherapy: Secondary | ICD-10-CM | POA: Diagnosis not present

## 2019-03-17 DIAGNOSIS — C50812 Malignant neoplasm of overlapping sites of left female breast: Secondary | ICD-10-CM | POA: Diagnosis not present

## 2019-03-17 DIAGNOSIS — Z171 Estrogen receptor negative status [ER-]: Secondary | ICD-10-CM | POA: Diagnosis not present

## 2019-03-18 ENCOUNTER — Encounter: Payer: Self-pay | Admitting: Family Medicine

## 2019-03-18 ENCOUNTER — Ambulatory Visit (INDEPENDENT_AMBULATORY_CARE_PROVIDER_SITE_OTHER): Payer: Medicare Other | Admitting: Family Medicine

## 2019-03-18 VITALS — BP 140/70 | HR 102 | Temp 97.2°F | Wt 167.0 lb

## 2019-03-18 DIAGNOSIS — K219 Gastro-esophageal reflux disease without esophagitis: Secondary | ICD-10-CM | POA: Diagnosis not present

## 2019-03-18 MED ORDER — OMEPRAZOLE 40 MG PO CPDR: 40.0000 mg | DELAYED_RELEASE_CAPSULE | Freq: Every day | ORAL | 0 refills | Status: DC

## 2019-03-18 NOTE — Progress Notes (Signed)
   Subjective:    Patient ID: Yolanda Green, female    DOB: 01-07-1946, 74 y.o.   MRN: [id number]  HPI Here to follow up on upper abdominal discomfort. We discussed this a week ago and we felt it may be due to GERD. She started on Omeprazole 40 mg once a day, and this has improved a bit. The squeezing sensation is now less intense and it is more localized to the epigastrium (as opposed to the entire upper abdomen). BMs are normal. No nausea or trouble swallowing. She just finished her last chemotherapy session.    Review of Systems  Constitutional: Negative.   Respiratory: Negative.   Cardiovascular: Negative.   Gastrointestinal: Positive for abdominal pain. Negative for abdominal distention, constipation, diarrhea, nausea and vomiting.       Objective:   Physical Exam Constitutional:      Appearance: Normal appearance.  Cardiovascular:     Rate and Rhythm: Normal rate and regular rhythm.     Pulses: Normal pulses.     Heart sounds: Normal heart sounds.  Pulmonary:     Effort: Pulmonary effort is normal.     Breath sounds: Normal breath sounds.  Chest:     Chest wall: No tenderness.  Abdominal:     General: Abdomen is flat. Bowel sounds are normal. There is no distension.     Palpations: Abdomen is soft. There is no mass.     Tenderness: There is no abdominal tenderness. There is no guarding or rebound.     Hernia: No hernia is present.  Neurological:     Mental Status: She is alert.           Assessment & Plan:  GERD, somewhat improved. She will increase the Omeprazole to 40 mg BID. Recheck in 2 weeks.  Alysia Penna, MD

## 2019-03-20 ENCOUNTER — Emergency Department (HOSPITAL_COMMUNITY): Payer: Medicare Other

## 2019-03-20 ENCOUNTER — Encounter (HOSPITAL_COMMUNITY): Payer: Self-pay | Admitting: Pharmacy Technician

## 2019-03-20 ENCOUNTER — Other Ambulatory Visit: Payer: Self-pay

## 2019-03-20 ENCOUNTER — Emergency Department (HOSPITAL_COMMUNITY)
Admission: EM | Admit: 2019-03-20 | Discharge: 2019-03-20 | Disposition: A | Discharge status: Home / Self Care | Payer: Medicare Other | Attending: Emergency Medicine | Admitting: Emergency Medicine

## 2019-03-20 DIAGNOSIS — I10 Essential (primary) hypertension: Secondary | ICD-10-CM | POA: Diagnosis not present

## 2019-03-20 DIAGNOSIS — Z853 Personal history of malignant neoplasm of breast: Secondary | ICD-10-CM | POA: Insufficient documentation

## 2019-03-20 DIAGNOSIS — J984 Other disorders of lung: Secondary | ICD-10-CM | POA: Diagnosis not present

## 2019-03-20 DIAGNOSIS — Z79899 Other long term (current) drug therapy: Secondary | ICD-10-CM | POA: Insufficient documentation

## 2019-03-20 DIAGNOSIS — D701 Agranulocytosis secondary to cancer chemotherapy: Secondary | ICD-10-CM

## 2019-03-20 DIAGNOSIS — R0789 Other chest pain: Secondary | ICD-10-CM | POA: Diagnosis not present

## 2019-03-20 DIAGNOSIS — T451X5A Adverse effect of antineoplastic and immunosuppressive drugs, initial encounter: Secondary | ICD-10-CM | POA: Insufficient documentation

## 2019-03-20 DIAGNOSIS — C50912 Malignant neoplasm of unspecified site of left female breast: Secondary | ICD-10-CM | POA: Diagnosis not present

## 2019-03-20 DIAGNOSIS — R079 Chest pain, unspecified: Secondary | ICD-10-CM

## 2019-03-20 DIAGNOSIS — Z5111 Encounter for antineoplastic chemotherapy: Secondary | ICD-10-CM | POA: Diagnosis not present

## 2019-03-20 LAB — BASIC METABOLIC PANEL
Anion gap: 8 (ref 5–15)
BUN: 5 mg/dL — ABNORMAL LOW (ref 8–23)
CO2: 25 mmol/L (ref 22–32)
Calcium: 9 mg/dL (ref 8.9–10.3)
Chloride: 107 mmol/L (ref 98–111)
Creatinine, Ser: 0.67 mg/dL (ref 0.44–1.00)
GFR calc Af Amer: >60 mL/min (ref >60)
GFR calc non Af Amer: >60 mL/min (ref >60)
Glucose, Bld: 104 mg/dL — ABNORMAL HIGH (ref 70–99)
Potassium: 3.9 mmol/L (ref 3.5–5.1)
Sodium: 140 mmol/L (ref 135–145)

## 2019-03-20 LAB — HEPATIC FUNCTION PANEL
ALT: 19 U/L (ref 0–44)
AST: 20 U/L (ref 15–41)
Albumin: 3.3 g/dL — ABNORMAL LOW (ref 3.5–5.0)
Alkaline Phosphatase: 59 U/L (ref 38–126)
Bilirubin, Direct: 0.1 mg/dL (ref 0.0–0.2)
Indirect Bilirubin: 0.7 mg/dL (ref 0.3–0.9)
Total Bilirubin: 0.8 mg/dL (ref 0.3–1.2)
Total Protein: 6.1 g/dL — ABNORMAL LOW (ref 6.5–8.1)

## 2019-03-20 LAB — CBC WITH DIFFERENTIAL/PLATELET
Abs Immature Granulocytes: 0 10*3/uL (ref 0.00–0.07)
Basophils Absolute: 0 10*3/uL (ref 0.0–0.1)
Basophils Relative: 0 %
Eosinophils Absolute: 0 10*3/uL (ref 0.0–0.5)
Eosinophils Relative: 2 %
HCT: 32.9 % — ABNORMAL LOW (ref 36.0–46.0)
Hemoglobin: 11.4 g/dL — ABNORMAL LOW (ref 12.0–15.0)
Immature Granulocytes: 0 %
Lymphocytes Relative: 37 %
Lymphs Abs: 0.3 10*3/uL — ABNORMAL LOW (ref 0.7–4.0)
MCH: 35.5 pg — ABNORMAL HIGH (ref 26.0–34.0)
MCHC: 34.7 g/dL (ref 30.0–36.0)
MCV: 102.5 fL — ABNORMAL HIGH (ref 80.0–100.0)
Monocytes Absolute: 0.1 10*3/uL (ref 0.1–1.0)
Monocytes Relative: 5 %
Neutro Abs: 0.5 10*3/uL — ABNORMAL LOW (ref 1.7–7.7)
Neutrophils Relative %: 56 %
Platelets: 255 10*3/uL (ref 150–400)
RBC: 3.21 MIL/uL — ABNORMAL LOW (ref 3.87–5.11)
RDW: 17.1 % — ABNORMAL HIGH (ref 11.5–15.5)
WBC: 0.9 10*3/uL — CL (ref 4.0–10.5)
nRBC: 2.2 % — ABNORMAL HIGH (ref 0.0–0.2)

## 2019-03-20 LAB — LIPASE, BLOOD: Lipase: 16 U/L (ref 11–51)

## 2019-03-20 LAB — CBC
HCT: 33.3 % — ABNORMAL LOW (ref 36.0–46.0)
Hemoglobin: 11.2 g/dL — ABNORMAL LOW (ref 12.0–15.0)
MCH: 34.6 pg — ABNORMAL HIGH (ref 26.0–34.0)
MCHC: 33.6 g/dL (ref 30.0–36.0)
MCV: 102.8 fL — ABNORMAL HIGH (ref 80.0–100.0)
Platelets: 245 10*3/uL (ref 150–400)
RBC: 3.24 MIL/uL — ABNORMAL LOW (ref 3.87–5.11)
RDW: 16.8 % — ABNORMAL HIGH (ref 11.5–15.5)
WBC: 0.9 10*3/uL — CL (ref 4.0–10.5)
nRBC: 2.2 % — ABNORMAL HIGH (ref 0.0–0.2)

## 2019-03-20 LAB — TROPONIN I (HIGH SENSITIVITY)
Troponin I (High Sensitivity): 42 ng/L — ABNORMAL HIGH (ref <18)
Troponin I (High Sensitivity): 32 ng/L — ABNORMAL HIGH (ref <18)

## 2019-03-20 LAB — D-DIMER, QUANTITATIVE: D-Dimer, Quant: 0.86 ug/mL-FEU — ABNORMAL HIGH (ref 0.00–0.50)

## 2019-03-20 MED ORDER — ASPIRIN 81 MG PO CHEW
324.0000 mg | CHEWABLE_TABLET | Freq: Once | ORAL | Status: AC
  Administered 2019-03-20: 324 mg via ORAL
  Filled 2019-03-20: qty 4

## 2019-03-20 MED ORDER — SODIUM CHLORIDE 0.9% FLUSH: 10.0000 mL | INTRAVENOUS | Status: DC | PRN

## 2019-03-20 MED ORDER — SODIUM CHLORIDE 0.9% FLUSH: 3.0000 mL | Freq: Once | INTRAVENOUS | Status: DC

## 2019-03-20 MED ORDER — HEPARIN SOD (PORK) LOCK FLUSH 100 UNIT/ML IV SOLN
500.0000 [IU] | Freq: Once | INTRAVENOUS | Status: AC
  Administered 2019-03-20: 500 [IU]
  Filled 2019-03-20: qty 5

## 2019-03-20 MED ORDER — IOHEXOL 350 MG/ML SOLN
50.0000 mL | Freq: Once | INTRAVENOUS | Status: AC | PRN
  Administered 2019-03-20: 50 mL via INTRAVENOUS

## 2019-03-20 MED ORDER — SODIUM CHLORIDE 0.9% FLUSH: 10.0000 mL | Freq: Two times a day (BID) | INTRAVENOUS | Status: DC

## 2019-03-20 MED ORDER — CHLORHEXIDINE GLUCONATE CLOTH 2 % EX PADS
6.0000 | MEDICATED_PAD | Freq: Every day | CUTANEOUS | Status: DC
  Administered 2019-03-20: 6 via TOPICAL

## 2019-03-20 MED ORDER — SODIUM CHLORIDE 0.9 % IV BOLUS
1000.0000 mL | Freq: Once | INTRAVENOUS | Status: AC
  Administered 2019-03-20: 1000 mL via INTRAVENOUS

## 2019-03-20 NOTE — ED Provider Notes (Signed)
[facility] EMERGENCY DEPARTMENT Provider Note   CSN: [id number] Arrival date & time: 03/20/19  0732     History Chief Complaint  Patient presents with  . Chest Pain    Yolanda Green is a 74 y.o. female.  HPI 74 year old female presents with lower chest pain.  Ongoing for a couple weeks but seems to be worse.  Pain is pretty much constant.  Feels like a heaviness/pressure across her inferior chest, inferior to her breast.  No abdominal pain.  Saw her PCP and was treated for GERD with omeprazole but it has not helped.  Nothing specifically makes it better or worse including no exertion.  There is no shortness of breath.  No vomiting.  Pain is about a 7 out of 10.  No back pain or radiation of the pain.  She is currently receiving chemotherapy for breast cancer. No pain with eating. No leg swelling.    Past Medical History:  Diagnosis Date  . Cancer ([place])   . Family history of breast cancer   . Family history of prostate cancer   . Family history of stomach cancer   . Fibroids   . H/O varicella   . History of measles, mumps, or rubella   . Hypertension   . PMB (postmenopausal bleeding) 2005  . Stenotic cervical os 07/08/03  . Stress 06/20/09  . Trichomonas 05/08/03  . Yeast infection     Patient Active Problem List   Diagnosis Date Noted  . Dyslipidemia 10/20/2018  . Family history of breast cancer   . Family history of prostate cancer   . Family history of stomach cancer   . Malignant neoplasm of upper-outer quadrant of left breast in female, estrogen receptor negative ([place]) 10/12/2018  . Essential hypertension 12/23/2006    Past Surgical History:  Procedure Laterality Date  . COLONOSCOPY  2016   per Dr. Collene Mares, precancerous polyps, repeat in 5 yrs   . DILATION AND CURETTAGE OF UTERUS  07/08/03  . HYSTEROSCOPY  07/08/03  . INGUINAL HERNIA REPAIR  1996  . TUBAL LIGATION  1979  . WISDOM TOOTH EXTRACTION       OB History    Gravida  2   Para    2   Term  2   Preterm      AB      Living  2     SAB      TAB      Ectopic      Multiple      Live Births  2           Family History  Problem Relation Age of Onset  . Prostate cancer Father 78       not metastatic  . Diabetes Sister   . Hearing loss Son   . Breast cancer Paternal Aunt 14       1 aunt with breast cancer  . Depression Maternal Grandmother   . Stomach cancer Paternal Grandfather 57    Social History   Tobacco Use  . Smoking status: Never Smoker  . Smokeless tobacco: Never Used  Substance Use Topics  . Alcohol use: Yes    Comment: occasional glass of wine  . Drug use: No    Home Medications Prior to Admission medications   Medication Sig Start Date End Date Taking? Authorizing Provider  amLODipine (NORVASC) 10 MG tablet TAKE 1 TABLET BY MOUTH EVERY DAY Patient taking differently: Take 10 mg by mouth daily.  03/14/19  Yes Laurey Morale, MD  atorvastatin (LIPITOR) 40 MG tablet TAKE 1 TABLET BY MOUTH EVERY DAY Patient taking differently: Take 40 mg by mouth daily.  03/04/19  Yes Laurey Morale, MD  omeprazole (PRILOSEC) 40 MG capsule Take 1 capsule (40 mg total) by mouth daily. 03/18/19  Yes Laurey Morale, MD  dexamethasone (DECADRON) 4 MG tablet Take 1 tablets by mouth daily starting the day after Carboplatin and Cytoxan x 2 days. Take with food. Patient not taking: Reported on 03/20/2019 10/13/18   Nicholas Lose, MD  lidocaine-prilocaine (EMLA) cream Apply to affected area once Patient not taking: Reported on 03/20/2019 10/13/18   Nicholas Lose, MD  LORazepam (ATIVAN) 0.5 MG tablet Take 1 tablet (0.5 mg total) by mouth at bedtime as needed for sleep. Patient not taking: Reported on 03/20/2019 10/13/18   Nicholas Lose, MD  ondansetron (ZOFRAN) 8 MG tablet Take 1 tablet (8 mg total) by mouth 2 (two) times daily as needed. Start on the third day after chemotherapy. Patient not taking: Reported on 03/20/2019 10/13/18   Nicholas Lose, MD  prochlorperazine  (COMPAZINE) 10 MG tablet Take 1 tablet (10 mg total) by mouth every 6 (six) hours as needed (Nausea or vomiting). Patient not taking: Reported on 03/20/2019 10/13/18   Nicholas Lose, MD    Allergies    Patient has no known allergies.  Review of Systems   Review of Systems  Constitutional: Negative for fever.  Respiratory: Negative for shortness of breath.   Cardiovascular: Positive for chest pain.  Gastrointestinal: Negative for abdominal pain and vomiting.  Musculoskeletal: Negative for back pain.  All other systems reviewed and are negative.   Physical Exam Updated Vital Signs BP 130/85   Pulse 100   Temp 97.9 F (36.6 C) (Oral) Comment: Simultaneous filing. User may not have seen previous data.  Resp (!) 21   SpO2 100%   Physical Exam Vitals and nursing note reviewed.  Constitutional:      Appearance: She is well-developed.  HENT:     Head: Normocephalic and atraumatic.     Right Ear: External ear normal.     Left Ear: External ear normal.     Nose: Nose normal.  Eyes:     General:        Right eye: No discharge.        Left eye: No discharge.  Cardiovascular:     Rate and Rhythm: Normal rate and regular rhythm.     Heart sounds: Normal heart sounds.  Pulmonary:     Effort: Pulmonary effort is normal.     Breath sounds: Normal breath sounds.  Chest:     Chest wall: No tenderness.  Abdominal:     Palpations: Abdomen is soft.     Tenderness: There is no abdominal tenderness.  Musculoskeletal:     Right lower leg: No edema.     Left lower leg: No edema.  Skin:    General: Skin is warm and dry.  Neurological:     Mental Status: She is alert.  Psychiatric:        Mood and Affect: Mood is anxious.     ED Results / Procedures / Treatments   Labs (all labs ordered are listed, but only abnormal results are displayed) Labs Reviewed  BASIC METABOLIC PANEL - Abnormal; Notable for the following components:      Result Value   Glucose, Bld 104 (*)    BUN 5 (*)  All other components within normal limits  CBC - Abnormal; Notable for the following components:   WBC 0.9 (*)    RBC 3.24 (*)    Hemoglobin 11.2 (*)    HCT 33.3 (*)    MCV 102.8 (*)    MCH 34.6 (*)    RDW 16.8 (*)    nRBC 2.2 (*)    All other components within normal limits  D-DIMER, QUANTITATIVE (NOT AT [facility]) - Abnormal; Notable for the following components:   D-Dimer, Quant 0.86 (*)    All other components within normal limits  CBC WITH DIFFERENTIAL/PLATELET - Abnormal; Notable for the following components:   WBC 0.9 (*)    RBC 3.21 (*)    Hemoglobin 11.4 (*)    HCT 32.9 (*)    MCV 102.5 (*)    MCH 35.5 (*)    RDW 17.1 (*)    nRBC 2.2 (*)    Neutro Abs 0.5 (*)    Lymphs Abs 0.3 (*)    All other components within normal limits  HEPATIC FUNCTION PANEL - Abnormal; Notable for the following components:   Total Protein 6.1 (*)    Albumin 3.3 (*)    All other components within normal limits  TROPONIN I (HIGH SENSITIVITY) - Abnormal; Notable for the following components:   Troponin I (High Sensitivity) 42 (*)    All other components within normal limits  TROPONIN I (HIGH SENSITIVITY) - Abnormal; Notable for the following components:   Troponin I (High Sensitivity) 32 (*)    All other components within normal limits  LIPASE, BLOOD    EKG EKG Interpretation  Date/Time:  Sunday March 20 2019 09:09:05 EST Ventricular Rate:  90 PR Interval:  158 QRS Duration: 95 QT Interval:  317 QTC Calculation: 388 R Axis:   46 Text Interpretation: Sinus rhythm Low voltage, extremity and precordial leads no acute ST/T changes similar to nov 2019 Confirmed by Sherwood Gambler [phone]) on 03/20/2019 9:13:15 AM   Radiology DG Chest 2 View  Result Date: 03/20/2019 CLINICAL DATA:  74 year old who recently completed neoadjuvant chemotherapy for LEFT breast cancer, presenting with acute onset of chest tightness. EXAM: CHEST - 2 VIEW COMPARISON:  CT chest 10/20/2018. Chest x-ray 11/16/2017 and  earlier. FINDINGS: Cardiac silhouette normal in size, unchanged. Thoracic aorta mildly atherosclerotic. Hilar and mediastinal contours otherwise unremarkable. RIGHT jugular Port-A-Cath tip in the LOWER SVC. Linear scarring in the lower lobes, right middle lobe and lingula, unchanged. Lungs otherwise clear. No localized airspace consolidation. No pleural effusions. No pneumothorax. Normal pulmonary vascularity. Mild degenerative changes involving the thoracic and upper lumbar spine. IMPRESSION: No acute cardiopulmonary disease. Stable scarring involving the lower lobes, right middle lobe and lingula. Electronically Signed   By: Evangeline Dakin M.D.   On: 03/20/2019 08:07   CT Angio Chest PE W and/or Wo Contrast  Result Date: 03/20/2019 CLINICAL DATA:  Chest tightness. EXAM: CT ANGIOGRAPHY CHEST WITH CONTRAST TECHNIQUE: Multidetector CT imaging of the chest was performed using the standard protocol during bolus administration of intravenous contrast. Multiplanar CT image reconstructions and MIPs were obtained to evaluate the vascular anatomy. CONTRAST:  24mL OMNIPAQUE IOHEXOL 350 MG/ML SOLN COMPARISON:  Chest radiographs obtained earlier today. Chest CT dated 10/20/2018. FINDINGS: Cardiovascular: Normally opacified pulmonary arteries with no pulmonary arterial filling defects seen. Enlarged heart with an interval increase in size. Atheromatous calcifications, including the thoracic aorta. Mediastinum/Nodes: Subcentimeter nodule in each lobe of the thyroid gland. No enlarged lymph nodes. The previously seen left axillary adenopathy  has resolved. Surgical absence of the previously demonstrated left lateral breast mass. Stable appearance of the esophagus. Lungs/Pleura: Mild bilateral linear scarring. No lung nodules or pleural fluid. Upper Abdomen: No significant change in multiple liver cysts. Musculoskeletal: 6 thoracic and lower cervical spine degenerative changes. Review of the MIP images confirms the above  findings. IMPRESSION: 1. No pulmonary emboli or acute abnormality. 2. Interval cardiomegaly. 3. Resolved left axillary adenopathy, status post left lumpectomy. Aortic Atherosclerosis (ICD10-I70.0). Electronically Signed   By: Claudie Revering M.D.   On: 03/20/2019 12:29    Procedures Procedures (including critical care time)  Medications Ordered in ED Medications  sodium chloride flush (NS) 0.9 % injection 10-40 mL (has no administration in time range)  sodium chloride flush (NS) 0.9 % injection 10-40 mL (has no administration in time range)  Chlorhexidine Gluconate Cloth 2 % PADS 6 each (6 each Topical Given 03/20/19 1302)  sodium chloride 0.9 % bolus 1,000 mL (0 mLs Intravenous Stopped 03/20/19 1526)  aspirin chewable tablet 324 mg (324 mg Oral Given 03/20/19 1206)  iohexol (OMNIPAQUE) 350 MG/ML injection 50 mL (50 mLs Intravenous Contrast Given 03/20/19 1158)  heparin lock flush 100 unit/mL (500 Units Intracatheter Given 03/20/19 1508)    ED Course  I have reviewed the triage vital signs and the nursing notes.  Pertinent labs & imaging results that were available during my care of the patient were reviewed by me and considered in my medical decision making (see chart for details).    MDM Rules/Calculators/A&P                      Patient declines pain medicine in the ED.  Her pain is quite atypical and has been constant for 3 weeks or so.  No exertional component.  She has vaguely mildly elevated troponins but no elevation noted be consistent with MI.  Given her cancer history and tachycardia, work-up for PE obtained but is negative.  She is neutropenic but no signs of infection or history of fever and is likely related to her recent chemotherapy. Discussed with cardiology, but given length of time of symptoms, no ECG changes, Dr. Stanford Breed recommends outpatient cards follow up. Discussed with daughter and patient. We discussed return precautions and plan for outpatient follow up. No abdominal  pain/tenderness.  Final Clinical Impression(s) / ED Diagnoses Final diagnoses:  Chest pain  Chemotherapy-induced neutropenia [facility])    Rx / DC Orders ED Discharge Orders    None       Sherwood Gambler, MD 03/20/19 1601

## 2019-03-20 NOTE — ED Notes (Signed)
Updated Pt on EDP coming to talk to her

## 2019-03-20 NOTE — ED Notes (Signed)
Patient verbalizes understanding of discharge instructions . Opportunity for questions and answers were provided . Armband removed by staff ,Pt discharged from ED. W/C  offered at D/C  and Declined W/C at D/C and was escorted to lobby by RN.  

## 2019-03-20 NOTE — ED Triage Notes (Signed)
Pt bib pov with reports of chest tightness. reports was seen by pcp and prescribed medications for acid reflux. Pt just finished chemo and was informed that this is common for the type of chemo she was on.

## 2019-03-20 NOTE — Discharge Instructions (Addendum)
If you develop recurrent, continued, or worsening chest pain, shortness of breath, fever, vomiting, abdominal or back pain, or any other new/concerning symptoms then return to the ER for evaluation.  

## 2019-03-20 NOTE — ED Notes (Signed)
At PT bed side when staff phone rang. Daughter called Sec to let staff know Pt needed to go to [facility].Pt assisted to and from [place].

## 2019-03-23 ENCOUNTER — Telehealth (INDEPENDENT_AMBULATORY_CARE_PROVIDER_SITE_OTHER): Payer: Medicare Other | Admitting: Family Medicine

## 2019-03-23 ENCOUNTER — Other Ambulatory Visit: Payer: Self-pay

## 2019-03-23 DIAGNOSIS — R1010 Upper abdominal pain, unspecified: Secondary | ICD-10-CM | POA: Diagnosis not present

## 2019-03-23 NOTE — Progress Notes (Signed)
Virtual Visit via Telephone Note  I connected with the patient on 03/23/19 at 11:30 AM EST by telephone and verified that I am speaking with the correct person using two identifiers.   I discussed the limitations, risks, security and privacy concerns of performing an evaluation and management service by telephone and the availability of in person appointments. I also discussed with the patient that there may be a patient responsible charge related to this service. The patient expressed understanding and agreed to proceed.  Location patient: home Location provider: work or home office Participants present for the call: patient, provider Patient did not have a visit in the prior 7 days to address this/these issue(s).   History of Present Illness: Here to follow up on an ER visit on 03-20-19 for upper abdominal pain. This pain started about 3 weeks ago and it has been present constantly since then. No change with eating food or with exertion. No SOB or fever or nausea. No change in bowel or urinary habits. She has been taking Omeprazole 40 mg BID with no relief. At the ER her labs were normal, as was the EKG and even a chest CT angiogram. No etiology was found and they advised her to follow up with [place]. sheis able to eat and drink, but she eats small meals and snacks frequently rather than large meals.    Observations/Objective: Patient sounds cheerful and well on the phone. I do not appreciate any SOB. Speech and thought processing are grossly intact. Patient reported vitals:  Assessment and Plan: The source of her abdominal pain remains elusive. We will set up an abdominal US soon to get a better look at her gall bladder, etc. She will let us know if anything changes.  Alysia Penna, MD   Follow Up Instructions:     [phone] 5-10 [phone] 11-20 [phone] 21-30 I did not refer this patient for an OV in the next 24 hours for this/these issue(s).  I discussed the assessment and treatment plan with  the patient. The patient was provided an opportunity to ask questions and all were answered. The patient agreed with the plan and demonstrated an understanding of the instructions.   The patient was advised to call back or seek an in-person evaluation if the symptoms worsen or if the condition fails to improve as anticipated.  I provided 18 minutes of non-face-to-face time during this encounter.   Alysia Penna, MD

## 2019-03-25 ENCOUNTER — Emergency Department (HOSPITAL_COMMUNITY): Payer: Medicare Other

## 2019-03-25 ENCOUNTER — Encounter (HOSPITAL_COMMUNITY): Payer: Self-pay | Admitting: Emergency Medicine

## 2019-03-25 ENCOUNTER — Other Ambulatory Visit: Payer: Self-pay

## 2019-03-25 ENCOUNTER — Emergency Department (HOSPITAL_COMMUNITY)
Admission: EM | Admit: 2019-03-25 | Discharge: 2019-03-25 | Disposition: A | Discharge status: Home / Self Care | Payer: Medicare Other | Attending: Emergency Medicine | Admitting: Emergency Medicine

## 2019-03-25 DIAGNOSIS — Z79899 Other long term (current) drug therapy: Secondary | ICD-10-CM | POA: Diagnosis not present

## 2019-03-25 DIAGNOSIS — N289 Disorder of kidney and ureter, unspecified: Secondary | ICD-10-CM | POA: Insufficient documentation

## 2019-03-25 DIAGNOSIS — I10 Essential (primary) hypertension: Secondary | ICD-10-CM | POA: Insufficient documentation

## 2019-03-25 DIAGNOSIS — R1010 Upper abdominal pain, unspecified: Secondary | ICD-10-CM | POA: Diagnosis not present

## 2019-03-25 DIAGNOSIS — N2889 Other specified disorders of kidney and ureter: Secondary | ICD-10-CM | POA: Diagnosis not present

## 2019-03-25 LAB — COMPREHENSIVE METABOLIC PANEL
ALT: 16 U/L (ref 0–44)
AST: 19 U/L (ref 15–41)
Albumin: 3.3 g/dL — ABNORMAL LOW (ref 3.5–5.0)
Alkaline Phosphatase: 58 U/L (ref 38–126)
Anion gap: 7 (ref 5–15)
BUN: 5 mg/dL — ABNORMAL LOW (ref 8–23)
CO2: 24 mmol/L (ref 22–32)
Calcium: 9.2 mg/dL (ref 8.9–10.3)
Chloride: 110 mmol/L (ref 98–111)
Creatinine, Ser: 0.72 mg/dL (ref 0.44–1.00)
GFR calc Af Amer: >60 mL/min (ref >60)
GFR calc non Af Amer: >60 mL/min (ref >60)
Glucose, Bld: 96 mg/dL (ref 70–99)
Potassium: 4.2 mmol/L (ref 3.5–5.1)
Sodium: 141 mmol/L (ref 135–145)
Total Bilirubin: 0.4 mg/dL (ref 0.3–1.2)
Total Protein: 5.6 g/dL — ABNORMAL LOW (ref 6.5–8.1)

## 2019-03-25 LAB — CBC WITH DIFFERENTIAL/PLATELET
Abs Immature Granulocytes: 0.02 10*3/uL (ref 0.00–0.07)
Basophils Absolute: 0 10*3/uL (ref 0.0–0.1)
Basophils Relative: 2 %
Eosinophils Absolute: 0 10*3/uL (ref 0.0–0.5)
Eosinophils Relative: 2 %
HCT: 32.6 % — ABNORMAL LOW (ref 36.0–46.0)
Hemoglobin: 10.7 g/dL — ABNORMAL LOW (ref 12.0–15.0)
Immature Granulocytes: 1 %
Lymphocytes Relative: 35 %
Lymphs Abs: 0.7 10*3/uL (ref 0.7–4.0)
MCH: 34.3 pg — ABNORMAL HIGH (ref 26.0–34.0)
MCHC: 32.8 g/dL (ref 30.0–36.0)
MCV: 104.5 fL — ABNORMAL HIGH (ref 80.0–100.0)
Monocytes Absolute: 0.1 10*3/uL (ref 0.1–1.0)
Monocytes Relative: 7 %
Neutro Abs: 1 10*3/uL — ABNORMAL LOW (ref 1.7–7.7)
Neutrophils Relative %: 53 %
Platelets: 282 10*3/uL (ref 150–400)
RBC: 3.12 MIL/uL — ABNORMAL LOW (ref 3.87–5.11)
RDW: 17.1 % — ABNORMAL HIGH (ref 11.5–15.5)
WBC: 2 10*3/uL — ABNORMAL LOW (ref 4.0–10.5)
nRBC: 1.5 % — ABNORMAL HIGH (ref 0.0–0.2)

## 2019-03-25 LAB — LIPASE, BLOOD: Lipase: 17 U/L (ref 11–51)

## 2019-03-25 MED ORDER — IOHEXOL 300 MG/ML  SOLN
100.0000 mL | Freq: Once | INTRAMUSCULAR | Status: AC | PRN
  Administered 2019-03-25: 100 mL via INTRAVENOUS

## 2019-03-25 MED ORDER — LIDOCAINE VISCOUS HCL 2 % MT SOLN
15.0000 mL | Freq: Once | OROMUCOSAL | Status: AC
  Administered 2019-03-25: 15 mL via ORAL
  Filled 2019-03-25: qty 15

## 2019-03-25 MED ORDER — SUCRALFATE 1 GM/10ML PO SUSP: 1.0000 g | Freq: Three times a day (TID) | ORAL | 0 refills | Status: DC

## 2019-03-25 MED ORDER — ALUM & MAG HYDROXIDE-SIMETH 200-200-20 MG/5ML PO SUSP
30.0000 mL | Freq: Once | ORAL | Status: AC
  Administered 2019-03-25: 30 mL via ORAL
  Filled 2019-03-25: qty 30

## 2019-03-25 NOTE — Discharge Instructions (Addendum)
Continue taking home medications as prescribed. Take Carafate as needed for stomach pain. Make sure you stay well-hydrated water. Be careful with your diet; avoid greasy, spicy, acidic foods. Follow-up with your GI doctor for further evaluation of her upper stomach pain. Follow-up with your urologist regarding your kidney lesion. Return to the emergency room if you develop fevers, severe worsening pain, persistent vomiting, or any new, worsening, or concerning symptoms.

## 2019-03-25 NOTE — Telephone Encounter (Signed)
Patient has follow-up appointment on 04/04/19.

## 2019-03-25 NOTE — ED Provider Notes (Signed)
[facility] EMERGENCY DEPARTMENT Provider Note   CSN: [id number] Arrival date & time: 03/25/19  [id number]     History Chief Complaint  Patient presents with  . Abdominal Pain    Yolanda Green is a 74 y.o. female presenting for evaluation of upper abdominal pain.  Patient states that the past 3 weeks or so she has been having persistent upper abdominal pain.  Pain has been worsening each day, and was severe last night.  Pain is constant, occasionally worse after eating or drinking.  She was on Prilosec, but reports no improvement with this and this is no longer taking it.  She has not tried anything else for her pain including Tylenol, ibuprofen, Tums.  She has previous history of heartburn or GERD.  She was seen in the ED last week, had a negative cardiac and PE work-up.  She followed with her primary care doctor virtually yesterday, plan was for outpatient imaging, however his pain worsened patient instead came to the ER.  She denies fevers, chills, cough, shortness of breath, nausea, vomiting, lower abdominal pain, urinary symptoms.  Patient states over the past 4 days, she has had to strain to have a bowel movement, which is abnormal for her.  No blood in her stool.  She received her last round of chemotherapy 8 days ago.  She is being treated for breast cancer.  Additional history came for chart review.  Patient with a history of breast cancer, fibroids, hypertension.   She reports no previous EGD.  She has had a hernia repair, but no other abdominal surgeries.  HPI     Past Medical History:  Diagnosis Date  . Cancer ([place])   . Family history of breast cancer   . Family history of prostate cancer   . Family history of stomach cancer   . Fibroids   . H/O varicella   . History of measles, mumps, or rubella   . Hypertension   . PMB (postmenopausal bleeding) 2005  . Stenotic cervical os 07/08/03  . Stress 06/20/09  . Trichomonas 05/08/03  . Yeast infection      Patient Active Problem List   Diagnosis Date Noted  . Dyslipidemia 10/20/2018  . Family history of breast cancer   . Family history of prostate cancer   . Family history of stomach cancer   . Malignant neoplasm of upper-outer quadrant of left breast in female, estrogen receptor negative ([place]) 10/12/2018  . Essential hypertension 12/23/2006    Past Surgical History:  Procedure Laterality Date  . COLONOSCOPY  2016   per Dr. Collene Mares, precancerous polyps, repeat in 5 yrs   . DILATION AND CURETTAGE OF UTERUS  07/08/03  . HYSTEROSCOPY  07/08/03  . INGUINAL HERNIA REPAIR  1996  . TUBAL LIGATION  1979  . WISDOM TOOTH EXTRACTION       OB History    Gravida  2   Para  2   Term  2   Preterm      AB      Living  2     SAB      TAB      Ectopic      Multiple      Live Births  2           Family History  Problem Relation Age of Onset  . Prostate cancer Father 61       not metastatic  . Diabetes Sister   . Hearing loss Son   .  Breast cancer Paternal Aunt 53       1 aunt with breast cancer  . Depression Maternal Grandmother   . Stomach cancer Paternal Grandfather 78    Social History   Tobacco Use  . Smoking status: Never Smoker  . Smokeless tobacco: Never Used  Substance Use Topics  . Alcohol use: Yes    Comment: occasional glass of wine  . Drug use: No    Home Medications Prior to Admission medications   Medication Sig Start Date End Date Taking? Authorizing Provider  amLODipine (NORVASC) 10 MG tablet TAKE 1 TABLET BY MOUTH EVERY DAY Patient taking differently: Take 10 mg by mouth daily.  03/14/19   Laurey Morale, MD  atorvastatin (LIPITOR) 40 MG tablet TAKE 1 TABLET BY MOUTH EVERY DAY Patient taking differently: Take 40 mg by mouth daily.  03/04/19   Laurey Morale, MD  dexamethasone (DECADRON) 4 MG tablet Take 1 tablets by mouth daily starting the day after Carboplatin and Cytoxan x 2 days. Take with food. Patient not taking: Reported on 03/20/2019  10/13/18   Nicholas Lose, MD  lidocaine-prilocaine (EMLA) cream Apply to affected area once Patient not taking: Reported on 03/20/2019 10/13/18   Nicholas Lose, MD  LORazepam (ATIVAN) 0.5 MG tablet Take 1 tablet (0.5 mg total) by mouth at bedtime as needed for sleep. Patient not taking: Reported on 03/20/2019 10/13/18   Nicholas Lose, MD  omeprazole (PRILOSEC) 40 MG capsule Take 1 capsule (40 mg total) by mouth daily. 03/18/19   Laurey Morale, MD  ondansetron (ZOFRAN) 8 MG tablet Take 1 tablet (8 mg total) by mouth 2 (two) times daily as needed. Start on the third day after chemotherapy. Patient not taking: Reported on 03/20/2019 10/13/18   Nicholas Lose, MD  prochlorperazine (COMPAZINE) 10 MG tablet Take 1 tablet (10 mg total) by mouth every 6 (six) hours as needed (Nausea or vomiting). Patient not taking: Reported on 03/20/2019 10/13/18   Nicholas Lose, MD  sucralfate (CARAFATE) 1 GM/10ML suspension Take 10 mLs (1 g total) by mouth 4 (four) times daily -  with meals and at bedtime. 03/25/19   Adaiah Morken, PA-C    Allergies    Patient has no known allergies.  Review of Systems   Review of Systems  Gastrointestinal: Positive for abdominal pain.  All other systems reviewed and are negative.   Physical Exam Updated Vital Signs BP (!) 164/97   Pulse 87   Temp 98.4 F (36.9 C) (Oral)   Resp (!) 25   Ht 5\' 4"  (1.626 m)   Wt 76.7 kg   SpO2 96%   BMI 29.01 kg/m   Physical Exam Vitals and nursing note reviewed.  Constitutional:      General: She is not in acute distress.    Appearance: She is well-developed.     Comments: Resting comfortably in the bed in no acute distress  HENT:     Head: Normocephalic and atraumatic.  Eyes:     Conjunctiva/sclera: Conjunctivae normal.     Pupils: Pupils are equal, round, and reactive to light.  Cardiovascular:     Rate and Rhythm: Normal rate and regular rhythm.  Pulmonary:     Effort: Pulmonary effort is normal. No respiratory distress.     Breath  sounds: Normal breath sounds. No wheezing.  Abdominal:     General: Bowel sounds are normal. There is no distension.     Palpations: Abdomen is soft.     Tenderness: There is no  abdominal tenderness.       Comments: No significant tenderness palpation the abdomen.  No rigidity, guarding, distention.  Negative rebound.  No peritonitis.  Musculoskeletal:        General: Normal range of motion.     Cervical back: Normal range of motion and neck supple.  Skin:    General: Skin is warm and dry.     Capillary Refill: Capillary refill takes less than 2 seconds.  Neurological:     Mental Status: She is alert and oriented to person, place, and time.     ED Results / Procedures / Treatments   Labs (all labs ordered are listed, but only abnormal results are displayed) Labs Reviewed  CBC WITH DIFFERENTIAL/PLATELET - Abnormal; Notable for the following components:      Result Value   WBC 2.0 (*)    RBC 3.12 (*)    Hemoglobin 10.7 (*)    HCT 32.6 (*)    MCV 104.5 (*)    MCH 34.3 (*)    RDW 17.1 (*)    nRBC 1.5 (*)    Neutro Abs 1.0 (*)    All other components within normal limits  COMPREHENSIVE METABOLIC PANEL - Abnormal; Notable for the following components:   BUN 5 (*)    Total Protein 5.6 (*)    Albumin 3.3 (*)    All other components within normal limits  LIPASE, BLOOD    EKG EKG Interpretation  Date/Time:  Friday March 25 2019 08:48:39 EST Ventricular Rate:  90 PR Interval:    QRS Duration: 81 QT Interval:  341 QTC Calculation: 422 R Axis:   13 Text Interpretation: Sinus rhythm Borderline low voltage, extremity leads since last tracing no significant change Confirmed by Malvin Johns [phone]) on 03/25/2019 9:01:48 AM   Radiology CT ABDOMEN PELVIS W CONTRAST  Result Date: 03/25/2019 CLINICAL DATA:  Upper abdominal pain.  History of breast cancer. EXAM: CT ABDOMEN AND PELVIS WITH CONTRAST TECHNIQUE: Multidetector CT imaging of the abdomen and pelvis was performed using  the standard protocol following bolus administration of intravenous contrast. CONTRAST:  142mL OMNIPAQUE IOHEXOL 300 MG/ML  SOLN COMPARISON:  10/20/2018.  MRI abdomen 11/12/2018. FINDINGS: Lower chest: Bibasilar subsegmental atelectasis. Hepatobiliary: Multiple hepatic cysts are similar to prior ranging in size from 4 mm up to 7.3 cm cystic lesion in segment III along the falciform ligament has decreased in size in the interval. Small area of low attenuation in the anterior liver, adjacent to the falciform ligament, is in a characteristic location for focal fatty deposition. There is no evidence for gallstones, gallbladder wall thickening, or pericholecystic fluid. No intrahepatic or extrahepatic biliary dilation. Pancreas: No focal mass lesion. No dilatation of the main duct. No intraparenchymal cyst. No peripancreatic edema. Spleen: No splenomegaly. No focal mass lesion. Adrenals/Urinary Tract: Slight interval increase in adrenal thickening without discrete nodule or mass. 9 mm exophytic lesion posterior upper interpolar right kidney (image 30/series 5) appears to enhance after IV contrast administration consistent with the enhancing lesion seen on previous MRI. Left kidney unremarkable. No evidence for hydroureter. Bladder is decompressed with subtle ill definition of bladder wall anatomy. Stomach/Bowel: Stomach is unremarkable. No gastric wall thickening. No evidence of outlet obstruction. Duodenum is normally positioned as is the ligament of Treitz. No small bowel wall thickening. No small bowel dilatation. The terminal ileum is normal. The appendix is normal. No gross colonic mass. No colonic wall thickening. Diverticular changes are noted in the left colon without evidence of diverticulitis. Vascular/Lymphatic:  There is abdominal aortic atherosclerosis without aneurysm. There is no gastrohepatic or hepatoduodenal ligament lymphadenopathy. No retroperitoneal or mesenteric lymphadenopathy. No pelvic sidewall  lymphadenopathy. Reproductive: The uterus is unremarkable.  There is no adnexal mass. Other: No intraperitoneal free fluid. Musculoskeletal: No worrisome lytic or sclerotic osseous abnormality. IMPRESSION: 1. Interval increase in size of the enhancing exophytic upper interpolar right renal lesion consistent with renal cell carcinoma, now measuring 9 mm compared to 6 mm on MRI of 11/12/2018. 2. Slight interval increase in adrenal gland thickness bilaterally without a discrete nodule or mass. Continued attention on follow-up recommended. 3. The tiny cystic foci noted in the pancreas on previous MRI are not evident by CT today. 4. Bladder wall appears irregular and mildly thickened although the bladder is decompressed which could account for this appearance. Component of infection/inflammation not excluded. 5.  Aortic Atherosclerois (ICD10-170.0) Electronically Signed   By: Misty Stanley M.D.   On: 03/25/2019 11:44    Procedures Procedures (including critical care time)  Medications Ordered in ED Medications  iohexol (OMNIPAQUE) 300 MG/ML solution 100 mL (100 mLs Intravenous Contrast Given 03/25/19 1115)  alum & mag hydroxide-simeth (MAALOX/MYLANTA) 200-200-20 MG/5ML suspension 30 mL (30 mLs Oral Given 03/25/19 1219)    And  lidocaine (XYLOCAINE) 2 % viscous mouth solution 15 mL (15 mLs Oral Given 03/25/19 1219)    ED Course  I have reviewed the triage vital signs and the nursing notes.  Pertinent labs & imaging results that were available during my care of the patient were reviewed by me and considered in my medical decision making (see chart for details).    MDM Rules/Calculators/A&P                      Patient presented for evaluation of upper abdominal pain.  Physical exam shows patient appears nontoxic.  She is afebrile and not tachycardic.  No significant abdominal pain on exam.  However, considering her age and medical history, higher concern for intra-abdominal pathology.  Consider  pancreatitis.  Consider obstruction due to difficulty having bowel movements.  However most likely patient has GERD/gastritis/PUD in the setting of chemotherapy.  Will obtain labs and CT abdomen pelvis.  If negative, plan for symptomatic treatment and GI follow-up.  Labs interpreted by me, overall reassuring.  White count improving from previous visit.  Hemoglobin stable.  Lipase normal.  LFTs and bili normal.  CT pending.  CT abdomen pelvis does not show any signs of infection, perforation obstruction, surgical abdomen.  Patient has enlargement of a right renal cyst which was demonstrated previously, concerning for renal cell carcinoma.  I do not believe this is the cause of patient's symptoms today, however discussed findings with patient.  She states she has a urologist with [facility] who she is seen about the renal lesion previously and who is following this.  She will follow up with him/her.   Patient reports symptoms are improved after GI cocktail.  I still have high suspicion that this is esophagitis/gastritis/pud.  Will have patient continue to treat symptomatically.  Will have patient follow-up with GI for further evaluation.  At this time, patient appears safe for discharge.  Return precautions given.  Patient states she understands and agrees to plan.  Final Clinical Impression(s) / ED Diagnoses Final diagnoses:  Upper abdominal pain  Renal lesion    Rx / DC Orders ED Discharge Orders         Ordered    sucralfate (CARAFATE) 1 GM/10ML suspension  3 times daily with meals & bedtime     03/25/19 1249           Grove Defina, PA-C 03/25/19 1317    Malvin Johns, MD 03/25/19 1343

## 2019-03-25 NOTE — ED Notes (Signed)
Taken to CT.

## 2019-03-25 NOTE — ED Triage Notes (Signed)
Pt endorses abd tightness for about 3 weeks. States she has a CT abd scheduled for next week but can't wait due to worsening pain. Recently off of chemo for breast cancer. Last BM 2 days ago but has been straining worse than normal.

## 2019-03-28 ENCOUNTER — Telehealth: Payer: Self-pay | Admitting: Family Medicine

## 2019-03-28 NOTE — Telephone Encounter (Signed)
Noted  

## 2019-03-28 NOTE — Telephone Encounter (Signed)
Pt visited the ED this past Friday b/c she felt horrible, could hardly get out of bed, and had tightness around her abdomen below the breast. They did a CAT scan of her abdomen and did not find anything. ED recommened she follow up with her PCP (she is set for 3/22 with Sarajane Jews). She made an appt with her Colonoscopy Physician next Thursday 3/25.    FYI for PCP

## 2019-03-29 DIAGNOSIS — C50912 Malignant neoplasm of unspecified site of left female breast: Secondary | ICD-10-CM | POA: Diagnosis not present

## 2019-03-29 DIAGNOSIS — Z171 Estrogen receptor negative status [ER-]: Secondary | ICD-10-CM | POA: Diagnosis not present

## 2019-03-29 DIAGNOSIS — C50812 Malignant neoplasm of overlapping sites of left female breast: Secondary | ICD-10-CM | POA: Diagnosis not present

## 2019-03-31 DIAGNOSIS — C50812 Malignant neoplasm of overlapping sites of left female breast: Secondary | ICD-10-CM | POA: Diagnosis not present

## 2019-03-31 DIAGNOSIS — R1011 Right upper quadrant pain: Secondary | ICD-10-CM | POA: Diagnosis not present

## 2019-03-31 DIAGNOSIS — Z171 Estrogen receptor negative status [ER-]: Secondary | ICD-10-CM | POA: Diagnosis not present

## 2019-03-31 DIAGNOSIS — R1013 Epigastric pain: Secondary | ICD-10-CM | POA: Diagnosis not present

## 2019-03-31 DIAGNOSIS — Z780 Asymptomatic menopausal state: Secondary | ICD-10-CM | POA: Diagnosis not present

## 2019-03-31 DIAGNOSIS — R1012 Left upper quadrant pain: Secondary | ICD-10-CM | POA: Diagnosis not present

## 2019-03-31 DIAGNOSIS — N289 Disorder of kidney and ureter, unspecified: Secondary | ICD-10-CM | POA: Diagnosis not present

## 2019-03-31 DIAGNOSIS — K7689 Other specified diseases of liver: Secondary | ICD-10-CM | POA: Diagnosis not present

## 2019-04-01 ENCOUNTER — Other Ambulatory Visit: Payer: Medicare Other

## 2019-04-01 ENCOUNTER — Other Ambulatory Visit: Payer: Self-pay

## 2019-04-04 ENCOUNTER — Encounter: Payer: Self-pay | Admitting: Family Medicine

## 2019-04-04 ENCOUNTER — Other Ambulatory Visit: Payer: Self-pay

## 2019-04-04 ENCOUNTER — Ambulatory Visit (INDEPENDENT_AMBULATORY_CARE_PROVIDER_SITE_OTHER): Payer: Medicare Other | Admitting: Family Medicine

## 2019-04-04 VITALS — BP 124/82 | HR 94 | Temp 97.7°F | Ht 64.0 in | Wt 167.1 lb

## 2019-04-04 DIAGNOSIS — R1010 Upper abdominal pain, unspecified: Secondary | ICD-10-CM | POA: Diagnosis not present

## 2019-04-04 NOTE — Progress Notes (Signed)
   Subjective:    Patient ID: Yolanda Green, female    DOB: 12-Dec-1945, 74 y.o.   MRN: [id number]  HPI Here to follow up on an ER visit on 03-25-19 for upper abdominal pain. She had labs and an abdominal CT, all of which were unremarkable. She was given Sucralfate suspension to take on top of the Omeprazole we had given here. However after nit feeling any relief in a week, she stopped taking both of these. She continues to have daily bloating and upper abdominal pain, but her appetite is good. No nausea or vomiting. She takes prune juice daily and this helps her bowels to move daily. She has made an appointment to see her GI doctor (Dr. Collene Mares) tomorrow.    Review of Systems  Constitutional: Negative.   Respiratory: Negative.   Cardiovascular: Negative.   Gastrointestinal: Positive for abdominal distention and abdominal pain. Negative for anal bleeding, blood in stool, constipation, diarrhea, nausea, rectal pain and vomiting.  Genitourinary: Negative.        Objective:   Physical Exam Constitutional:      Appearance: Normal appearance. She is not ill-appearing.  Cardiovascular:     Rate and Rhythm: Normal rate and regular rhythm.     Pulses: Normal pulses.     Heart sounds: Normal heart sounds.  Pulmonary:     Effort: Pulmonary effort is normal.     Breath sounds: Normal breath sounds.  Abdominal:     General: Abdomen is flat. Bowel sounds are normal. There is no distension.     Palpations: Abdomen is soft. There is no mass.     Tenderness: There is no abdominal tenderness. There is no guarding or rebound.     Hernia: No hernia is present.  Neurological:     Mental Status: She is alert.           Assessment & Plan:  Upper abdominal pain. She will see Dr. Collene Mares tomorrow to discuss her upcoming colonoscopy. I would imagine Dr. Collene Mares will want to do upper endoscopy at the same time. She will see [place] as needed. Alysia Penna, MD

## 2019-04-05 DIAGNOSIS — R140 Abdominal distension (gaseous): Secondary | ICD-10-CM | POA: Diagnosis not present

## 2019-04-05 DIAGNOSIS — K5900 Constipation, unspecified: Secondary | ICD-10-CM | POA: Diagnosis not present

## 2019-04-05 DIAGNOSIS — K5730 Diverticulosis of large intestine without perforation or abscess without bleeding: Secondary | ICD-10-CM | POA: Diagnosis not present

## 2019-04-05 DIAGNOSIS — Z1211 Encounter for screening for malignant neoplasm of colon: Secondary | ICD-10-CM | POA: Diagnosis not present

## 2019-04-15 ENCOUNTER — Encounter (HOSPITAL_COMMUNITY): Payer: Self-pay | Admitting: Emergency Medicine

## 2019-04-15 ENCOUNTER — Other Ambulatory Visit: Payer: Self-pay

## 2019-04-15 ENCOUNTER — Emergency Department (HOSPITAL_COMMUNITY): Payer: Medicare Other

## 2019-04-15 ENCOUNTER — Emergency Department (HOSPITAL_COMMUNITY)
Admission: EM | Admit: 2019-04-15 | Discharge: 2019-04-15 | Disposition: A | Discharge status: Home / Self Care | Payer: Medicare Other | Attending: Emergency Medicine | Admitting: Emergency Medicine

## 2019-04-15 DIAGNOSIS — M62838 Other muscle spasm: Secondary | ICD-10-CM | POA: Insufficient documentation

## 2019-04-15 DIAGNOSIS — I10 Essential (primary) hypertension: Secondary | ICD-10-CM | POA: Diagnosis not present

## 2019-04-15 DIAGNOSIS — R1013 Epigastric pain: Secondary | ICD-10-CM | POA: Diagnosis not present

## 2019-04-15 DIAGNOSIS — R079 Chest pain, unspecified: Secondary | ICD-10-CM

## 2019-04-15 DIAGNOSIS — R0789 Other chest pain: Secondary | ICD-10-CM | POA: Diagnosis not present

## 2019-04-15 DIAGNOSIS — R000 Tachycardia, unspecified: Secondary | ICD-10-CM | POA: Diagnosis not present

## 2019-04-15 DIAGNOSIS — Z79899 Other long term (current) drug therapy: Secondary | ICD-10-CM | POA: Diagnosis not present

## 2019-04-15 LAB — CBC
HCT: 39.9 % (ref 36.0–46.0)
Hemoglobin: 13.2 g/dL (ref 12.0–15.0)
MCH: 33.8 pg (ref 26.0–34.0)
MCHC: 33.1 g/dL (ref 30.0–36.0)
MCV: 102.3 fL — ABNORMAL HIGH (ref 80.0–100.0)
Platelets: 241 10*3/uL (ref 150–400)
RBC: 3.9 MIL/uL (ref 3.87–5.11)
RDW: 16 % — ABNORMAL HIGH (ref 11.5–15.5)
WBC: 5.4 10*3/uL (ref 4.0–10.5)
nRBC: 0 % (ref 0.0–0.2)

## 2019-04-15 LAB — LIPASE, BLOOD: Lipase: 19 U/L (ref 11–51)

## 2019-04-15 LAB — COMPREHENSIVE METABOLIC PANEL
ALT: 15 U/L (ref 0–44)
AST: 22 U/L (ref 15–41)
Albumin: 3.7 g/dL (ref 3.5–5.0)
Alkaline Phosphatase: 81 U/L (ref 38–126)
Anion gap: 8 (ref 5–15)
BUN: 7 mg/dL — ABNORMAL LOW (ref 8–23)
CO2: 25 mmol/L (ref 22–32)
Calcium: 9.6 mg/dL (ref 8.9–10.3)
Chloride: 107 mmol/L (ref 98–111)
Creatinine, Ser: 0.85 mg/dL (ref 0.44–1.00)
GFR calc Af Amer: >60 mL/min (ref >60)
GFR calc non Af Amer: >60 mL/min (ref >60)
Glucose, Bld: 102 mg/dL — ABNORMAL HIGH (ref 70–99)
Potassium: 4.3 mmol/L (ref 3.5–5.1)
Sodium: 140 mmol/L (ref 135–145)
Total Bilirubin: 0.4 mg/dL (ref 0.3–1.2)
Total Protein: 6.5 g/dL (ref 6.5–8.1)

## 2019-04-15 LAB — TROPONIN I (HIGH SENSITIVITY)
Troponin I (High Sensitivity): 26 ng/L — ABNORMAL HIGH (ref <18)
Troponin I (High Sensitivity): 31 ng/L — ABNORMAL HIGH (ref <18)

## 2019-04-15 MED ORDER — SODIUM CHLORIDE 0.9% FLUSH: 3.0000 mL | Freq: Once | INTRAVENOUS | Status: DC

## 2019-04-15 NOTE — ED Provider Notes (Signed)
  Physical Exam  BP 139/90   Pulse 91   Temp 98.8 F (37.1 C) (Oral)   Resp 16   Ht 5\' 5"  (1.651 m)   Wt 74.4 kg   SpO2 95%   BMI 27.29 kg/m   Physical Exam  ED Course/Procedures     Procedures  MDM  Received patient in signout.  Chest pain and upper abdominal pain.  Has been seen for the same over the last month.  Potential muscle spasms in upper abdomen.  I have reviewed imaging from over the last month and the most recent CT scan did show a new cardiomegaly compared to one done 6 months ago.  Had echocardiogram at that time.  Do not know if she is on cardiotoxic chemotherapy, and her previous CT showed borderline enlarged heart.  With worsening heart size and mildly elevated troponins feels the patient would benefit from following with her cardiologist.  Patient sees Pernell Dupre.       Davonna Belling, MD 04/15/19 Lurena Nida

## 2019-04-15 NOTE — ED Notes (Signed)
Patient verbalizes understanding of discharge instructions. Opportunity for questioning and answers were provided. Armband removed by staff, pt discharged from ED to home 

## 2019-04-15 NOTE — Discharge Instructions (Addendum)
Follow-up with cardiology since the CT scan done recently showed a new cardiomegaly and your heart enzymes are mildly elevated.

## 2019-04-15 NOTE — ED Provider Notes (Signed)
[facility] EMERGENCY DEPARTMENT Provider Note   CSN: [id number] Arrival date & time: 04/15/19  1347     History Chief Complaint  Patient presents with  . Chest Pain  . Abdominal Pain    Yolanda Green is a 74 y.o. female.  74 year old female with prior medical history as detailed below presents for evaluation of abdominal discomfort.  Patient reports that the epigastric area of her abdomen was "spasming."  This is been an intermittent occurrence for the last week.  She reports that she can see muscles twitching underneath the skin.  She is concerned that this may be a sign of a cardiac problem.  She denies associated chest pain or shortness of breath.  She denies diaphoresis or nausea.  Her family members corroborate the fact that they can see muscles moving in her abdomen when she is having symptoms.  The history is provided by the patient and medical records.  Chest Pain Pain location:  Epigastric Pain quality: sharp   Pain radiates to:  Does not radiate Pain severity:  Mild Onset quality:  Sudden Duration:  1 week Timing:  Intermittent Progression:  Waxing and waning Chronicity:  New Relieved by:  Nothing Worsened by:  Nothing Associated symptoms: abdominal pain   Abdominal Pain Associated symptoms: chest pain        Past Medical History:  Diagnosis Date  . Cancer ([place])   . Family history of breast cancer   . Family history of prostate cancer   . Family history of stomach cancer   . Fibroids   . H/O varicella   . History of measles, mumps, or rubella   . Hypertension   . PMB (postmenopausal bleeding) 2005  . Stenotic cervical os 07/08/03  . Stress 06/20/09  . Trichomonas 05/08/03  . Yeast infection     Patient Active Problem List   Diagnosis Date Noted  . Dyslipidemia 10/20/2018  . Family history of breast cancer   . Family history of prostate cancer   . Family history of stomach cancer   . Malignant neoplasm of upper-outer  quadrant of left breast in female, estrogen receptor negative ([place]) 10/12/2018  . Essential hypertension 12/23/2006    Past Surgical History:  Procedure Laterality Date  . COLONOSCOPY  2016   per Dr. Collene Mares, precancerous polyps, repeat in 5 yrs   . DILATION AND CURETTAGE OF UTERUS  07/08/03  . HYSTEROSCOPY  07/08/03  . INGUINAL HERNIA REPAIR  1996  . TUBAL LIGATION  1979  . WISDOM TOOTH EXTRACTION       OB History    Gravida  2   Para  2   Term  2   Preterm      AB      Living  2     SAB      TAB      Ectopic      Multiple      Live Births  2           Family History  Problem Relation Age of Onset  . Prostate cancer Father 29       not metastatic  . Diabetes Sister   . Hearing loss Son   . Breast cancer Paternal Aunt 50       1 aunt with breast cancer  . Depression Maternal Grandmother   . Stomach cancer Paternal Grandfather 19    Social History   Tobacco Use  . Smoking status: Never Smoker  . Smokeless  tobacco: Never Used  Substance Use Topics  . Alcohol use: Yes    Comment: occasional glass of wine  . Drug use: No    Home Medications Prior to Admission medications   Medication Sig Start Date End Date Taking? Authorizing Provider  amLODipine (NORVASC) 10 MG tablet TAKE 1 TABLET BY MOUTH EVERY DAY Patient taking differently: Take 10 mg by mouth daily.  03/14/19   Laurey Morale, MD  atorvastatin (LIPITOR) 40 MG tablet TAKE 1 TABLET BY MOUTH EVERY DAY Patient taking differently: Take 40 mg by mouth daily.  03/04/19   Laurey Morale, MD  dexamethasone (DECADRON) 4 MG tablet Take 1 tablets by mouth daily starting the day after Carboplatin and Cytoxan x 2 days. Take with food. Patient not taking: Reported on 03/20/2019 10/13/18   Nicholas Lose, MD  lidocaine-prilocaine (EMLA) cream Apply to affected area once Patient not taking: Reported on 03/20/2019 10/13/18   Nicholas Lose, MD  LORazepam (ATIVAN) 0.5 MG tablet Take 1 tablet (0.5 mg total) by mouth at  bedtime as needed for sleep. Patient not taking: Reported on 03/20/2019 10/13/18   Nicholas Lose, MD  omeprazole (PRILOSEC) 40 MG capsule Take 1 capsule (40 mg total) by mouth daily. Patient not taking: Reported on 04/04/2019 03/18/19   Laurey Morale, MD  ondansetron (ZOFRAN) 8 MG tablet Take 1 tablet (8 mg total) by mouth 2 (two) times daily as needed. Start on the third day after chemotherapy. Patient not taking: Reported on 03/20/2019 10/13/18   Nicholas Lose, MD  prochlorperazine (COMPAZINE) 10 MG tablet Take 1 tablet (10 mg total) by mouth every 6 (six) hours as needed (Nausea or vomiting). Patient not taking: Reported on 03/20/2019 10/13/18   Nicholas Lose, MD  sucralfate (CARAFATE) 1 GM/10ML suspension Take 10 mLs (1 g total) by mouth 4 (four) times daily -  with meals and at bedtime. Patient not taking: Reported on 04/04/2019 03/25/19   Caccavale, Sophia, PA-C    Allergies    Patient has no known allergies.  Review of Systems   Review of Systems  Cardiovascular: Positive for chest pain.  Gastrointestinal: Positive for abdominal pain.  All other systems reviewed and are negative.   Physical Exam Updated Vital Signs BP (!) 169/92   Pulse (!) 110   Temp 98.8 F (37.1 C) (Oral)   Resp 18   Ht 5\' 5"  (1.651 m)   Wt 74.4 kg   SpO2 97%   BMI 27.29 kg/m   Physical Exam Vitals and nursing note reviewed.  Constitutional:      General: She is not in acute distress.    Appearance: She is well-developed.  HENT:     Head: Normocephalic and atraumatic.  Eyes:     Conjunctiva/sclera: Conjunctivae normal.     Pupils: Pupils are equal, round, and reactive to light.  Cardiovascular:     Rate and Rhythm: Normal rate and regular rhythm.     Heart sounds: Normal heart sounds.  Pulmonary:     Effort: Pulmonary effort is normal. No respiratory distress.     Breath sounds: Normal breath sounds.  Abdominal:     General: There is no distension.     Palpations: Abdomen is soft.     Tenderness:  There is no abdominal tenderness.  Musculoskeletal:        General: No deformity. Normal range of motion.     Cervical back: Normal range of motion and neck supple.  Skin:    General: Skin is warm  and dry.  Neurological:     Mental Status: She is alert and oriented to person, place, and time.     ED Results / Procedures / Treatments   Labs (all labs ordered are listed, but only abnormal results are displayed) Labs Reviewed  CBC - Abnormal; Notable for the following components:      Result Value   MCV 102.3 (*)    RDW 16.0 (*)    All other components within normal limits  LIPASE, BLOOD  COMPREHENSIVE METABOLIC PANEL  TROPONIN I (HIGH SENSITIVITY)    EKG EKG Interpretation  Date/Time:  Friday April 15 2019 13:50:55 EDT Ventricular Rate:  105 PR Interval:  166 QRS Duration: 78 QT Interval:  324 QTC Calculation: 428 R Axis:   19 Text Interpretation: Sinus tachycardia with occasional Premature ventricular complexes Otherwise normal ECG Confirmed by Dene Gentry [phone]) on 04/15/2019 3:14:52 PM   Radiology No results found.  Procedures Procedures (including critical care time)  Medications Ordered in ED Medications  sodium chloride flush (NS) 0.9 % injection 3 mL (has no administration in time range)    ED Course  I have reviewed the triage vital signs and the nursing notes.  Pertinent labs & imaging results that were available during my care of the patient were reviewed by me and considered in my medical decision making (see chart for details).    MDM Rules/Calculators/A&P                      MDM  Screening complete  Kessie Smither was evaluated in Emergency Department on 04/15/2019 for the symptoms described in the history of present illness. She was evaluated in the context of the global COVID-19 pandemic, which necessitated consideration that the patient might be at risk for infection with the SARS-CoV-2 virus that causes COVID-19. Institutional  protocols and algorithms that pertain to the evaluation of patients at risk for COVID-19 are in a state of rapid change based on information released by regulatory bodies including the CDC and federal and state organizations. These policies and algorithms were followed during the patient's care in the ED.   Patient is presenting for evaluation of epigastric abdominal discomfort. Described symptoms are most consistent with muscle spasm.   Patient is concerning about possible ACS - will check troponin x 2.   Other screening labs are without significant abnormality.   Initial Trop is 23. Second troponin, re-eval, disposition signed out to next ED provider.    Final Clinical Impression(s) / ED Diagnoses Final diagnoses:  Muscle spasm    Rx / DC Orders ED Discharge Orders    None       Valarie Merino, MD 04/15/19 [phone]

## 2019-04-15 NOTE — ED Triage Notes (Addendum)
Upper abd and chest pain x several weeks   Feels like a tightening  And before  And it has gotten worse , noi n/v/sob, hx of Breast cancer  Finished chemo  March 4 20 n21

## 2019-04-19 DIAGNOSIS — C50812 Malignant neoplasm of overlapping sites of left female breast: Secondary | ICD-10-CM | POA: Diagnosis not present

## 2019-04-19 DIAGNOSIS — Z171 Estrogen receptor negative status [ER-]: Secondary | ICD-10-CM | POA: Diagnosis not present

## 2019-04-19 DIAGNOSIS — R079 Chest pain, unspecified: Secondary | ICD-10-CM | POA: Diagnosis not present

## 2019-04-19 DIAGNOSIS — Z01818 Encounter for other preprocedural examination: Secondary | ICD-10-CM | POA: Diagnosis not present

## 2019-04-19 DIAGNOSIS — Z8679 Personal history of other diseases of the circulatory system: Secondary | ICD-10-CM | POA: Diagnosis not present

## 2019-04-21 DIAGNOSIS — K5730 Diverticulosis of large intestine without perforation or abscess without bleeding: Secondary | ICD-10-CM | POA: Diagnosis not present

## 2019-04-21 DIAGNOSIS — K5904 Chronic idiopathic constipation: Secondary | ICD-10-CM | POA: Diagnosis not present

## 2019-04-21 DIAGNOSIS — R140 Abdominal distension (gaseous): Secondary | ICD-10-CM | POA: Diagnosis not present

## 2019-04-22 DIAGNOSIS — Z8679 Personal history of other diseases of the circulatory system: Secondary | ICD-10-CM | POA: Diagnosis not present

## 2019-04-22 DIAGNOSIS — I517 Cardiomegaly: Secondary | ICD-10-CM | POA: Diagnosis not present

## 2019-04-22 DIAGNOSIS — Z171 Estrogen receptor negative status [ER-]: Secondary | ICD-10-CM | POA: Diagnosis not present

## 2019-04-22 DIAGNOSIS — C50812 Malignant neoplasm of overlapping sites of left female breast: Secondary | ICD-10-CM | POA: Diagnosis not present

## 2019-04-22 DIAGNOSIS — R079 Chest pain, unspecified: Secondary | ICD-10-CM | POA: Diagnosis not present

## 2019-05-03 DIAGNOSIS — C50812 Malignant neoplasm of overlapping sites of left female breast: Secondary | ICD-10-CM | POA: Diagnosis not present

## 2019-05-03 DIAGNOSIS — Z171 Estrogen receptor negative status [ER-]: Secondary | ICD-10-CM | POA: Diagnosis not present

## 2019-05-04 DIAGNOSIS — C50912 Malignant neoplasm of unspecified site of left female breast: Secondary | ICD-10-CM | POA: Diagnosis not present

## 2019-05-04 DIAGNOSIS — N6092 Unspecified benign mammary dysplasia of left breast: Secondary | ICD-10-CM | POA: Diagnosis not present

## 2019-05-04 DIAGNOSIS — Z171 Estrogen receptor negative status [ER-]: Secondary | ICD-10-CM | POA: Diagnosis not present

## 2019-05-04 DIAGNOSIS — Z9221 Personal history of antineoplastic chemotherapy: Secondary | ICD-10-CM | POA: Diagnosis not present

## 2019-05-04 DIAGNOSIS — C773 Secondary and unspecified malignant neoplasm of axilla and upper limb lymph nodes: Secondary | ICD-10-CM | POA: Diagnosis not present

## 2019-05-04 DIAGNOSIS — Z452 Encounter for adjustment and management of vascular access device: Secondary | ICD-10-CM | POA: Diagnosis not present

## 2019-05-04 DIAGNOSIS — I10 Essential (primary) hypertension: Secondary | ICD-10-CM | POA: Diagnosis not present

## 2019-05-04 DIAGNOSIS — C50812 Malignant neoplasm of overlapping sites of left female breast: Secondary | ICD-10-CM | POA: Diagnosis not present

## 2019-05-05 DIAGNOSIS — C773 Secondary and unspecified malignant neoplasm of axilla and upper limb lymph nodes: Secondary | ICD-10-CM | POA: Diagnosis not present

## 2019-05-05 DIAGNOSIS — N6092 Unspecified benign mammary dysplasia of left breast: Secondary | ICD-10-CM | POA: Diagnosis not present

## 2019-05-05 DIAGNOSIS — Z452 Encounter for adjustment and management of vascular access device: Secondary | ICD-10-CM | POA: Diagnosis not present

## 2019-05-05 DIAGNOSIS — Z9221 Personal history of antineoplastic chemotherapy: Secondary | ICD-10-CM | POA: Diagnosis not present

## 2019-05-05 DIAGNOSIS — Z171 Estrogen receptor negative status [ER-]: Secondary | ICD-10-CM | POA: Diagnosis not present

## 2019-05-05 DIAGNOSIS — C50812 Malignant neoplasm of overlapping sites of left female breast: Secondary | ICD-10-CM | POA: Diagnosis not present

## 2019-05-12 DIAGNOSIS — C50812 Malignant neoplasm of overlapping sites of left female breast: Secondary | ICD-10-CM | POA: Diagnosis not present

## 2019-05-12 DIAGNOSIS — Z171 Estrogen receptor negative status [ER-]: Secondary | ICD-10-CM | POA: Diagnosis not present

## 2019-05-12 DIAGNOSIS — N289 Disorder of kidney and ureter, unspecified: Secondary | ICD-10-CM | POA: Diagnosis not present

## 2019-05-12 DIAGNOSIS — R109 Unspecified abdominal pain: Secondary | ICD-10-CM | POA: Diagnosis not present

## 2019-05-12 DIAGNOSIS — R600 Localized edema: Secondary | ICD-10-CM | POA: Diagnosis not present

## 2019-05-12 DIAGNOSIS — Z9012 Acquired absence of left breast and nipple: Secondary | ICD-10-CM | POA: Diagnosis not present

## 2019-05-16 DIAGNOSIS — M2550 Pain in unspecified joint: Secondary | ICD-10-CM | POA: Diagnosis not present

## 2019-05-16 DIAGNOSIS — Z9012 Acquired absence of left breast and nipple: Secondary | ICD-10-CM | POA: Diagnosis not present

## 2019-05-16 DIAGNOSIS — Z171 Estrogen receptor negative status [ER-]: Secondary | ICD-10-CM | POA: Diagnosis not present

## 2019-05-16 DIAGNOSIS — C50812 Malignant neoplasm of overlapping sites of left female breast: Secondary | ICD-10-CM | POA: Diagnosis not present

## 2019-05-16 DIAGNOSIS — R140 Abdominal distension (gaseous): Secondary | ICD-10-CM | POA: Diagnosis not present

## 2019-05-16 DIAGNOSIS — C773 Secondary and unspecified malignant neoplasm of axilla and upper limb lymph nodes: Secondary | ICD-10-CM | POA: Diagnosis not present

## 2019-05-16 DIAGNOSIS — G629 Polyneuropathy, unspecified: Secondary | ICD-10-CM | POA: Diagnosis not present

## 2019-05-17 DIAGNOSIS — N2889 Other specified disorders of kidney and ureter: Secondary | ICD-10-CM | POA: Diagnosis not present

## 2019-05-17 DIAGNOSIS — N281 Cyst of kidney, acquired: Secondary | ICD-10-CM | POA: Diagnosis not present

## 2019-05-23 DIAGNOSIS — Z171 Estrogen receptor negative status [ER-]: Secondary | ICD-10-CM | POA: Diagnosis not present

## 2019-05-23 DIAGNOSIS — C50812 Malignant neoplasm of overlapping sites of left female breast: Secondary | ICD-10-CM | POA: Diagnosis not present

## 2019-05-30 DIAGNOSIS — M791 Myalgia, unspecified site: Secondary | ICD-10-CM | POA: Diagnosis not present

## 2019-06-02 DIAGNOSIS — Z171 Estrogen receptor negative status [ER-]: Secondary | ICD-10-CM | POA: Diagnosis not present

## 2019-06-02 DIAGNOSIS — C50812 Malignant neoplasm of overlapping sites of left female breast: Secondary | ICD-10-CM | POA: Diagnosis not present

## 2019-06-02 DIAGNOSIS — E785 Hyperlipidemia, unspecified: Secondary | ICD-10-CM | POA: Diagnosis not present

## 2019-06-02 DIAGNOSIS — I10 Essential (primary) hypertension: Secondary | ICD-10-CM | POA: Diagnosis not present

## 2019-06-02 DIAGNOSIS — M79605 Pain in left leg: Secondary | ICD-10-CM | POA: Diagnosis not present

## 2019-06-02 DIAGNOSIS — Z79899 Other long term (current) drug therapy: Secondary | ICD-10-CM | POA: Diagnosis not present

## 2019-06-02 DIAGNOSIS — R600 Localized edema: Secondary | ICD-10-CM | POA: Diagnosis not present

## 2019-06-02 DIAGNOSIS — M79604 Pain in right leg: Secondary | ICD-10-CM | POA: Diagnosis not present

## 2019-06-08 ENCOUNTER — Ambulatory Visit: Payer: Medicare Other | Admitting: Internal Medicine

## 2019-06-08 ENCOUNTER — Encounter: Payer: Self-pay | Admitting: Internal Medicine

## 2019-06-08 ENCOUNTER — Ambulatory Visit (INDEPENDENT_AMBULATORY_CARE_PROVIDER_SITE_OTHER): Payer: Medicare Other | Admitting: Internal Medicine

## 2019-06-08 ENCOUNTER — Other Ambulatory Visit: Payer: Self-pay

## 2019-06-08 VITALS — BP 120/88 | HR 82 | Temp 97.2°F | Wt 158.0 lb

## 2019-06-08 DIAGNOSIS — R1013 Epigastric pain: Secondary | ICD-10-CM

## 2019-06-08 DIAGNOSIS — R9389 Abnormal findings on diagnostic imaging of other specified body structures: Secondary | ICD-10-CM

## 2019-06-08 LAB — LIPASE: Lipase: 5 U/L — ABNORMAL LOW (ref 11.0–59.0)

## 2019-06-08 NOTE — Progress Notes (Signed)
Acute Office Visit     This visit occurred during the SARS-CoV-2 public health emergency.  Safety protocols were in place, including screening questions prior to the visit, additional usage of staff PPE, and extensive cleaning of exam room while observing appropriate contact time as indicated for disinfecting solutions.    CC/Reason for Visit: Continued epigastric pain  HPI: Yolanda Green is a 74 y.o. female who is coming in today for the above mentioned reasons.  She has had upper epigastric pain radiating in a bandlike fashion to both the left and right upper quadrants since January.  She denies any nausea, vomiting, diarrhea or fevers.  She does have occasional constipation and has tried MiraLAX and Linzess.  She had to stop taking Linzess due to cramping.  She was on PPI and Carafate for about 4 to 5 weeks without improvement so she has discontinued both.  She has been seen by Dr. Sarajane Jews as well as by the ED a few times for this reason.  She also has a GI doctor, Dr. Collene Mares, she has never had an upper endoscopy.  She did have a CT scan in March that showed an interval increase in size of the lesion on her right kidney and that seems to be consistent with renal cell carcinoma.  She has a urologist through [facility].  Her past medical history significant for breast cancer status post left mastectomy on April 21st, currently undergoing treatment with her oncologist.   Past Medical/Surgical History: Past Medical History:  Diagnosis Date  . Cancer ([place])   . Family history of breast cancer   . Family history of prostate cancer   . Family history of stomach cancer   . Fibroids   . H/O varicella   . History of measles, mumps, or rubella   . Hypertension   . PMB (postmenopausal bleeding) 2005  . Stenotic cervical os 07/08/03  . Stress 06/20/09  . Trichomonas 05/08/03  . Yeast infection     Past Surgical History:  Procedure Laterality Date  . COLONOSCOPY  2016   per  Dr. Collene Mares, precancerous polyps, repeat in 5 yrs   . DILATION AND CURETTAGE OF UTERUS  07/08/03  . HYSTEROSCOPY  07/08/03  . INGUINAL HERNIA REPAIR  1996  . TUBAL LIGATION  1979  . WISDOM TOOTH EXTRACTION      Social History:  reports that she has never smoked. She has never used smokeless tobacco. She reports current alcohol use. She reports that she does not use drugs.  Allergies: No Known Allergies  Family History:  Family History  Problem Relation Age of Onset  . Prostate cancer Father 43       not metastatic  . Diabetes Sister   . Hearing loss Son   . Breast cancer Paternal Aunt 68       1 aunt with breast cancer  . Depression Maternal Grandmother   . Stomach cancer Paternal Grandfather 33     Current Outpatient Medications:  .  amLODipine (NORVASC) 10 MG tablet, TAKE 1 TABLET BY MOUTH EVERY DAY (Patient taking differently: Take 10 mg by mouth daily. ), Disp: 90 tablet, Rfl: 1 .  atorvastatin (LIPITOR) 40 MG tablet, TAKE 1 TABLET BY MOUTH EVERY DAY (Patient taking differently: Take 40 mg by mouth daily. ), Disp: 90 tablet, Rfl: 1 .  dexamethasone (DECADRON) 4 MG tablet, Take 1 tablets by mouth daily starting the day after Carboplatin and Cytoxan x 2 days. Take with food.,  Disp: 16 tablet, Rfl: 0 .  lidocaine-prilocaine (EMLA) cream, Apply to affected area once, Disp: 30 g, Rfl: 3 .  LORazepam (ATIVAN) 0.5 MG tablet, Take 1 tablet (0.5 mg total) by mouth at bedtime as needed for sleep., Disp: 30 tablet, Rfl: 0 .  omeprazole (PRILOSEC) 40 MG capsule, Take 1 capsule (40 mg total) by mouth daily., Disp: 60 capsule, Rfl: 0 .  ondansetron (ZOFRAN) 8 MG tablet, Take 1 tablet (8 mg total) by mouth 2 (two) times daily as needed. Start on the third day after chemotherapy., Disp: 30 tablet, Rfl: 1 .  prochlorperazine (COMPAZINE) 10 MG tablet, Take 1 tablet (10 mg total) by mouth every 6 (six) hours as needed (Nausea or vomiting)., Disp: 30 tablet, Rfl: 1 .  sucralfate (CARAFATE) 1 GM/10ML  suspension, Take 10 mLs (1 g total) by mouth 4 (four) times daily -  with meals and at bedtime., Disp: 420 mL, Rfl: 0  Review of Systems:  Constitutional: Denies fever, chills, diaphoresis, appetite change and fatigue.  HEENT: Denies photophobia, eye pain, redness, hearing loss, ear pain, congestion, sore throat, rhinorrhea, sneezing, mouth sores, trouble swallowing, neck pain, neck stiffness and tinnitus.   Respiratory: Denies SOB, DOE, cough, chest tightness,  and wheezing.   Cardiovascular: Denies chest pain, palpitations and leg swelling.  Gastrointestinal: Denies nausea, vomiting, , diarrhea,  blood in stool and abdominal distention.  Genitourinary: Denies dysuria, urgency, frequency, hematuria, flank pain and difficulty urinating.  Endocrine: Denies: hot or cold intolerance, sweats, changes in hair or nails, polyuria, polydipsia. Musculoskeletal: Denies myalgias, back pain, joint swelling, arthralgias and gait problem.  Skin: Denies pallor, rash and wound.  Neurological: Denies dizziness, seizures, syncope, weakness, light-headedness, numbness and headaches.  Hematological: Denies adenopathy. Easy bruising, personal or family bleeding history  Psychiatric/Behavioral: Denies suicidal ideation, mood changes, confusion, nervousness, sleep disturbance and agitation    Physical Exam: Vitals:   06/08/19 0707  BP: 120/88  Pulse: 82  Temp: (!) 97.2 F (36.2 C)  TempSrc: Temporal  SpO2: 95%  Weight: 158 lb (71.7 kg)    Body mass index is 26.29 kg/m.   Constitutional: NAD, calm, comfortable Eyes: PERRL, lids and conjunctivae normal, wears corrective lenses ENMT: Mucous membranes are moist.   Abdomen: no tenderness, no masses palpated. No hepatosplenomegaly. Bowel sounds positive.  Neurologic: Grossly intact and nonfocal Psychiatric: Normal judgment and insight. Alert and oriented x 3. Normal mood.    Impression and Plan:  Epigastric pain -Unclear as to the etiology. -Seems  like she has been seen several times for this. -Thorough work-up in both the ED and here. -My last suggestion would be to follow her again with her GI doctor to see if an upper endoscopy would be beneficial. -Will also obtain lipase to make sure this is not pancreatitis, although doubtful. -Does not appear to be cholecystitis/cholelithiasis especially with benign abdominal exam and recent CAT scan without biliary pathology.  Abnormal CT scan -CT scan done in the emergency department in March showed an interval increase from 6 to 9 mm since October in a new right lesion of the kidney concerning for renal cell carcinoma. -She states she is following with a urologist for [facility] and has a follow-up CT scan in June.    Patient Instructions  -Nice seeing you today!!  -Lab work today; will notify you once results are available.  -Follow up wit Dr. Collene Mares for consideration of an upper endoscopy.  -Make sure your urologist is aware of your kidney lesion.  Darnisha Vernet Hernandez Acosta, MD [facility]   

## 2019-06-08 NOTE — Patient Instructions (Signed)
-  Nice seeing you today!!  -Lab work today; will notify you once results are available.  -Follow up wit Dr. Collene Mares for consideration of an upper endoscopy.  -Make sure your urologist is aware of your kidney lesion.

## 2019-06-09 DIAGNOSIS — M791 Myalgia, unspecified site: Secondary | ICD-10-CM | POA: Diagnosis not present

## 2019-06-10 ENCOUNTER — Other Ambulatory Visit: Payer: Self-pay

## 2019-06-14 ENCOUNTER — Ambulatory Visit: Payer: Medicare Other | Admitting: Family Medicine

## 2019-06-16 ENCOUNTER — Other Ambulatory Visit: Payer: Self-pay

## 2019-06-17 ENCOUNTER — Ambulatory Visit: Payer: Medicare Other | Admitting: Family Medicine

## 2019-06-20 ENCOUNTER — Ambulatory Visit (INDEPENDENT_AMBULATORY_CARE_PROVIDER_SITE_OTHER): Payer: Medicare Other | Admitting: Family Medicine

## 2019-06-20 ENCOUNTER — Other Ambulatory Visit: Payer: Self-pay

## 2019-06-20 ENCOUNTER — Encounter: Payer: Self-pay | Admitting: Family Medicine

## 2019-06-20 VITALS — BP 124/64 | HR 89 | Temp 98.0°F | Wt 163.0 lb

## 2019-06-20 DIAGNOSIS — N2889 Other specified disorders of kidney and ureter: Secondary | ICD-10-CM

## 2019-06-20 DIAGNOSIS — Z171 Estrogen receptor negative status [ER-]: Secondary | ICD-10-CM

## 2019-06-20 DIAGNOSIS — R1010 Upper abdominal pain, unspecified: Secondary | ICD-10-CM | POA: Insufficient documentation

## 2019-06-20 DIAGNOSIS — C50412 Malignant neoplasm of upper-outer quadrant of left female breast: Secondary | ICD-10-CM

## 2019-06-20 NOTE — Progress Notes (Signed)
   Subjective:    Patient ID: Yolanda Green, female    DOB: May 24, 1945, 74 y.o.   MRN: [id number]  HPI Here to follow up on several issues. She continues to be troubled by constant bloating and pressure and discomfort in the upper abdomen. She has seen Dr. Collene Mares twice in the past few months, and she seemed to think that constipation was her problem. She treated her with Miralax, Colace, and even Linzess. All these helped her bowels to move, but the upper abdominal pain never went away. She has seen me and been to the ER for this. She saw Dr. Jerilee Hoh on 06-08-19 as well. Last time she saw her Oncologist at [facility], he referred her to [place], and she is scheduled to see Dr. Dorris Singh on 06-28-19. Otherwise she is in PT after her radical mastectomy in April. She is also being followed by [facility] for a mass on the right kidney. They are waiting to see if this reaches a size of 2.0 cm before considering a biopsy, etc.    Review of Systems  Constitutional: Negative.   Respiratory: Negative.   Cardiovascular: Negative.   Gastrointestinal: Positive for abdominal distention, abdominal pain and constipation. Negative for anal bleeding, blood in stool, diarrhea, nausea, rectal pain and vomiting.       Objective:   Physical Exam Constitutional:      Appearance: Normal appearance.  Cardiovascular:     Rate and Rhythm: Normal rate and regular rhythm.     Pulses: Normal pulses.     Heart sounds: Normal heart sounds.  Pulmonary:     Effort: Pulmonary effort is normal.     Breath sounds: Normal breath sounds.  Abdominal:     General: Abdomen is flat. Bowel sounds are normal. There is no distension.     Palpations: Abdomen is soft. There is no mass.     Tenderness: There is no abdominal tenderness. There is no guarding or rebound.     Hernia: No hernia is present.  Neurological:     Mental Status: She is alert.           Assessment & Plan:  Upper abdominal pain of uncertain  etiology. She will see [facility] as above. She will follow up with [facility] as well.  Alysia Penna, MD

## 2019-06-23 DIAGNOSIS — C50812 Malignant neoplasm of overlapping sites of left female breast: Secondary | ICD-10-CM | POA: Diagnosis not present

## 2019-06-23 DIAGNOSIS — Z171 Estrogen receptor negative status [ER-]: Secondary | ICD-10-CM | POA: Diagnosis not present

## 2019-06-30 DIAGNOSIS — N2889 Other specified disorders of kidney and ureter: Secondary | ICD-10-CM | POA: Diagnosis not present

## 2019-07-15 DIAGNOSIS — M791 Myalgia, unspecified site: Secondary | ICD-10-CM | POA: Diagnosis not present

## 2019-07-22 DIAGNOSIS — M791 Myalgia, unspecified site: Secondary | ICD-10-CM | POA: Diagnosis not present

## 2019-07-27 DIAGNOSIS — M791 Myalgia, unspecified site: Secondary | ICD-10-CM | POA: Diagnosis not present

## 2019-08-02 ENCOUNTER — Telehealth: Payer: Self-pay | Admitting: Interventional Cardiology

## 2019-08-02 NOTE — Telephone Encounter (Signed)
Pt had an episode of CP on 7/18.  Occurred in the middle of the night and was in the middle of her chest, close to breast.  She normally lays on her back but decided to lay on her right side and this occurred.  Felt like a needle prick sensation that resolved after standing.  Denies SOB, lightheadedness or dizziness. Also has tightness in her abdomen.  Finished chemo for breast CA in March and has not been feeling that well ever since.  She admits to intermittent CP but decided after the episode on the 18th and after speaking with a family member that is a physician, she better call.  Pt had an echo in April at [facility] after completing chemo.  Scheduled pt to see Dr. Tamala Julian 7/26.  Advised if CP returns, especially if worse, or has changes in symptoms, report to ED.  Pt verbalized understanding and was appreciative for call.

## 2019-08-02 NOTE — Telephone Encounter (Signed)
Pt c/o of Chest Pain: STAT if CP now or developed within 24 hours  1. Are you having CP right now? No  2. Are you experiencing any other symptoms (ex. SOB, nausea, vomiting, sweating)? No  3. How long have you been experiencing CP? Only experienced it 7/18 while laying in bed on her rt side. Was not severe. She said she hasn't had this feeling since then.   4. Is your CP continuous or coming and going? Comes and goes.   5. Have you taken Nitroglycerin? No ?  Patient advised that at one of her Yolanda Green ED visits in March of 2021, she was told that her heart is slightly enlarged as was told to f/u. She said she has been experiencing tightness in her abdomen and pain in the middle of her chest.

## 2019-08-03 ENCOUNTER — Ambulatory Visit (INDEPENDENT_AMBULATORY_CARE_PROVIDER_SITE_OTHER): Payer: Medicare Other | Admitting: Family Medicine

## 2019-08-03 ENCOUNTER — Encounter: Payer: Self-pay | Admitting: Family Medicine

## 2019-08-03 VITALS — BP 142/80 | HR 96 | Temp 98.2°F | Wt 169.4 lb

## 2019-08-03 DIAGNOSIS — R1010 Upper abdominal pain, unspecified: Secondary | ICD-10-CM | POA: Diagnosis not present

## 2019-08-03 NOTE — Progress Notes (Signed)
   Subjective:    Patient ID: Yolanda Green, female    DOB: [birth year], 74 y.o.   MRN: [id number]  HPI Here for ongoing issues with pressure in the upper abdomen. This has been going on since January. She has seen Dr. Collene Mares and had an unremarkable CT scan. She has tried constipation medications that help her bowels move, but these have not helped her epigastric symptoms. She saw Dr. Dorris Singh at [place] on 06-28-19, and he proposed doing an EGD. Unfortunately this could not be scheduled until October, so she is asking if anything else can be done. I had ordered an abdominal US in March, but this was never done. Her diet has been normal and her weight has been stable.    Review of Systems  Constitutional: Negative.   Respiratory: Negative.   Cardiovascular: Negative.   Gastrointestinal: Positive for abdominal pain. Negative for abdominal distention, anal bleeding, blood in stool, constipation, diarrhea, nausea, rectal pain and vomiting.  Genitourinary: Negative.        Objective:   Physical Exam Constitutional:      Appearance: Normal appearance.  Cardiovascular:     Rate and Rhythm: Normal rate and regular rhythm.     Pulses: Normal pulses.     Heart sounds: Normal heart sounds.  Pulmonary:     Effort: Pulmonary effort is normal.     Breath sounds: Normal breath sounds.  Abdominal:     General: Abdomen is flat. Bowel sounds are normal. There is no distension.     Palpations: Abdomen is soft. There is no mass.     Tenderness: There is no abdominal tenderness. There is no guarding or rebound.     Hernia: No hernia is present.  Neurological:     Mental Status: She is alert.           Assessment & Plan:  Upper abdominal pain of uncertain etiology. First I asked her to contact [facility] to set up the [place], since my order appears to still be active. Also I advised her to contact Dr. Collene Mares to see if she could perform an EGD sometime soon. In addition, [place] has set  up an appt to see Dr. Daneen Schick on 08-04-19 for a cardiologic evaluation.  Alysia Penna, MD

## 2019-08-03 NOTE — Progress Notes (Signed)
Cardiology Office Note:    Date:  08/04/2019   ID:  Brylin, Stopper 1945/10/27, MRN [id number]  PCP:  Laurey Morale, MD  Cardiologist:  Sinclair Grooms, MD   Referring MD: Laurey Morale, MD   Chief Complaint  Patient presents with  . Chest Pain    History of Present Illness:    Yolanda Green is a 74 y.o. female with a hx of  Chest pain, hypertension, aortic atherosclerosis, and hyperlipidemia.  She has completed chemotherapy and surgical resection for stage III breast cancer.  Agents did include anthracycline therapy, cyclophosphamide, and others.  An echocardiogram done at [facility] in April revealed preserved LV function.  She has had occasional sharp areas of pinpoint discomfort in her chest and is concerned that this could be related to her heart.  These episodes occur more frequently when she is lying down on her side.  Change in position can influence the intensity and duration of the discomfort.  She has never had exertional discomfort.  She denies dyspnea.  She occasionally is having some back discomfort at the same time that she has a fleeting pinpoint chest discomfort raising the question of thoracic disc compression or osteophyte causing nerve root irritation and referred pain.  Past Medical History:  Diagnosis Date  . Cancer ([place])   . Family history of breast cancer   . Family history of prostate cancer   . Family history of stomach cancer   . Fibroids   . H/O varicella   . History of measles, mumps, or rubella   . Hypertension   . PMB (postmenopausal bleeding) 2005  . Stenotic cervical os 07/08/03  . Stress 06/20/09  . Trichomonas 05/08/03  . Yeast infection     Past Surgical History:  Procedure Laterality Date  . COLONOSCOPY  2016   per Dr. Collene Mares, precancerous polyps, repeat in 5 yrs   . DILATION AND CURETTAGE OF UTERUS  07/08/03  . HYSTEROSCOPY  07/08/03  . INGUINAL HERNIA REPAIR  1996  . TUBAL LIGATION  1979  . WISDOM TOOTH EXTRACTION       Current Medications: Current Meds  Medication Sig  . amLODipine (NORVASC) 10 MG tablet TAKE 1 TABLET BY MOUTH EVERY DAY  . atorvastatin (LIPITOR) 40 MG tablet TAKE 1 TABLET BY MOUTH EVERY DAY  . dexamethasone (DECADRON) 4 MG tablet Take 1 tablets by mouth daily starting the day after Carboplatin and Cytoxan x 2 days. Take with food.  . lidocaine-prilocaine (EMLA) cream Apply to affected area once  . LORazepam (ATIVAN) 0.5 MG tablet Take 1 tablet (0.5 mg total) by mouth at bedtime as needed for sleep.  Marland Kitchen omeprazole (PRILOSEC) 40 MG capsule Take 1 capsule (40 mg total) by mouth daily.  . ondansetron (ZOFRAN) 8 MG tablet Take 1 tablet (8 mg total) by mouth 2 (two) times daily as needed. Start on the third day after chemotherapy.  . prochlorperazine (COMPAZINE) 10 MG tablet Take 1 tablet (10 mg total) by mouth every 6 (six) hours as needed (Nausea or vomiting).  . sucralfate (CARAFATE) 1 GM/10ML suspension Take 10 mLs (1 g total) by mouth 4 (four) times daily -  with meals and at bedtime.     Allergies:   Patient has no known allergies.   Social History   Socioeconomic History  . Marital status: Widowed    Spouse name: Not on file  . Number of children: 2  . Years of education: Not on file  .  Highest education level: Not on file  Occupational History  . Not on file  Tobacco Use  . Smoking status: Never Smoker  . Smokeless tobacco: Never Used  Vaping Use  . Vaping Use: Never used  Substance and Sexual Activity  . Alcohol use: Yes    Comment: occasional glass of wine  . Drug use: No  . Sexual activity: Not Currently    Birth control/protection: Surgical, Post-menopausal    Comment: BTL  Other Topics Concern  . Not on file  Social History Narrative  . Not on file   Social Determinants of Health   Financial Resource Strain:   . Difficulty of Paying Living Expenses:   Food Insecurity:   . Worried About Charity fundraiser in the Last Year:   . Arboriculturist in the Last  Year:   Transportation Needs:   . Film/video editor (Medical):   Marland Kitchen Lack of Transportation (Non-Medical):   Physical Activity:   . Days of Exercise per Week:   . Minutes of Exercise per Session:   Stress:   . Feeling of Stress :   Social Connections:   . Frequency of Communication with Friends and Family:   . Frequency of Social Gatherings with Friends and Family:   . Attends Religious Services:   . Active Member of Clubs or Organizations:   . Attends Archivist Meetings:   Marland Kitchen Marital Status:      Family History: The patient's family history includes Breast cancer (age of onset: 50) in her paternal aunt; Depression in her maternal grandmother; Diabetes in her sister; Hearing loss in her son; Prostate cancer (age of onset: 55) in her father; Stomach cancer (age of onset: 57) in her paternal grandfather.  ROS:   Please see the history of present illness.    She is having some costal continuous sensation of fullness.  Is made worse by eating.  It is not gone away since January.  She is seeing a GI specialist, Dr. Collene Mares.  All other systems reviewed and are negative.  EKGs/Labs/Other Studies Reviewed:    The following studies were reviewed today:  CT CHEST 03/2019 IMPRESSION: 1. No pulmonary emboli or acute abnormality. 2. Interval cardiomegaly. 3. Resolved left axillary adenopathy, status post left lumpectomy.  Aortic Atherosclerosis (ICD10-I70.0).   ECHOCARDIOGRAM 2020: IMPRESSIONS  1. Left ventricular ejection fraction, by visual estimation, is 60 to  65%. The left ventricle has normal function. Normal left ventricular size.  There is mildly increased left ventricular hypertrophy.  2. Left ventricular diastolic Doppler parameters are consistent with  impaired relaxation pattern of LV diastolic filling.  3. Global right ventricle has normal systolic function.The right  ventricular size is normal. No increase in right ventricular wall  thickness.  4. Left  atrial size was normal.  5. Right atrial size was normal.  6. Mild mitral annular calcification.  7. The mitral valve is normal in structure. Trace mitral valve  regurgitation. No evidence of mitral stenosis.  8. The tricuspid valve is normal in structure. Tricuspid valve  regurgitation is trivial.  9. The aortic valve is tricuspid Aortic valve regurgitation was not  visualized by color flow Doppler. Mild to moderate aortic valve  sclerosis/calcification without any evidence of aortic stenosis.  10. There is Moderate calcification of the aortic valve.  11. There is Moderate thickening of the aortic valve.  12. The pulmonic valve was normal in structure. Pulmonic valve  regurgitation is mild by color flow Doppler.  13. Normal pulmonary artery systolic pressure.  14. The inferior vena cava is normal in size with greater than 50%  respiratory variability, suggesting right atrial pressure of 3 mmHg.  15. 3 x 4cm circular fluid filled structure - likely liver cyst (seen on  abdominal CT).   ECHOCARDIOGRAM 02/2019 INTERPRETATION ---------------------------------------------------------------   NORMAL LEFT VENTRICULAR SYSTOLIC FUNCTION WITH MILD LVH   NORMAL RIGHT VENTRICULAR SYSTOLIC FUNCTION   VALVULAR REGURGITATION: TRIVIAL AR, TRIVIAL MR, TRIVIAL PR, TRIVIAL TR   NO VALVULAR STENOSIS   INCIDENTAL FINDING- MULTIPLE ANECHOIC STRUCTURES ( LARGEST MEASURING 7.2 X   6.4 cm) LOCATED IN LIVER; LIKELY REPRESENTING LIVER CYSTS, SUGGEST   DEDICATED LIVER IMAGING IF CLINICALLY INDICATED.   NO PRIOR STUDY FOR COMPARISON   3D acquisition and reconstructions were performed as part of this   examination to more accurately quantify the effects of Pre/Post Chemo,   Radiation or other therapy. (post-processing on an Independent   workstation).   EKG:  EKG normal sinus rhythm, low voltage, biatrial abnormality.  When compared to prior.  No change when compared to the prior tracing done  April 2021  Recent Labs: 10/20/2018: TSH 1.27 04/15/2019: ALT 15; BUN 7; Creatinine, Ser 0.85; Hemoglobin 13.2; Platelets 241; Potassium 4.3; Sodium 140  Recent Lipid Panel    Component Value Date/Time   CHOL 141 10/20/2018 1140   CHOL 150 01/29/2018 0951   TRIG 63.0 10/20/2018 1140   HDL 49.20 10/20/2018 1140   HDL 49 01/29/2018 0951   CHOLHDL 3 10/20/2018 1140   VLDL 12.6 10/20/2018 1140   LDLCALC 80 10/20/2018 1140   LDLCALC 90 01/29/2018 0951    Physical Exam:    VS:  BP 120/70   Pulse 91   Ht 5\' 5"  (1.651 m)   Wt 170 lb (77.1 kg)   SpO2 92%   BMI 28.29 kg/m     Wt Readings from Last 3 Encounters:  08/04/19 170 lb (77.1 kg)  08/03/19 169 lb 6.4 oz (76.8 kg)  06/20/19 163 lb (73.9 kg)     GEN: Somewhat anxious in appearance. No acute distress HEENT: Normal NECK: No JVD. LYMPHATICS: No lymphadenopathy CARDIAC:  RRR without murmur, gallop, or edema. VASCULAR:  Normal Pulses. No bruits. RESPIRATORY:  Clear to auscultation without rales, wheezing or rhonchi  ABDOMEN: Soft, non-tender, non-distended, No pulsatile mass, MUSCULOSKELETAL: No deformity  SKIN: Warm and dry NEUROLOGIC:  Alert and oriented x 3 PSYCHIATRIC:  Normal affect   ASSESSMENT:    1. Precordial pain   2. Aortic atherosclerosis ([place])   3. Hyperlipidemia LDL goal <70   4. Essential hypertension   5. Educated about COVID-19 virus infection   6. Malignant neoplasm of upper-outer quadrant of left breast in female, estrogen receptor negative ([place])    PLAN:    In order of problems listed above:  1. Noncardiac based upon clinical symptoms.  No evidence of pericardial effusion on echo in April.  Suspect discomfort is musculoskeletal or possibly inflammatory. 2. Continue control of blood pressure and use of Lipitor for primary prevention and progression. 3. LDL target less than 70 is recommended.  When recently checked it was 31 in October 2020.  Continue Lipitor 40 mg/day. 4. Target blood pressure  130/80.  Continue amlodipine. 5. She has been vaccinated.  She is practicing social distancing. 6. She has stage III breast cancer and is not excepting chemotherapy regimen recommended by [facility].  She is following with Dr. Annabell Sabal.  Clinical observation from cardiac standpoint.  Notify  us if she develops exertional discomfort. Clinical follow-up as needed.   Medication Adjustments/Labs and Tests Ordered: Current medicines are reviewed at length with the patient today.  Concerns regarding medicines are outlined above.  Orders Placed This Encounter  Procedures  . EKG 12-Lead   No orders of the defined types were placed in this encounter.   Patient Instructions  Medication Instructions:  Your physician recommends that you continue on your current medications as directed. Please refer to the Current Medication list given to you today.  *If you need a refill on your cardiac medications before your next appointment, please call your pharmacy*   Lab Work: None If you have labs (blood work) drawn today and your tests are completely normal, you will receive your results only by: Marland Kitchen MyChart Message (if you have MyChart) OR . A paper copy in the mail If you have any lab test that is abnormal or we need to change your treatment, we will call you to review the results.   Testing/Procedures: None   Follow-Up: At [facility], you and your health needs are our priority.  As part of our continuing mission to provide you with exceptional heart care, we have created designated Provider Care Teams.  These Care Teams include your primary Cardiologist (physician) and Advanced Practice Providers (APPs -  Physician Assistants and Nurse Practitioners) who all work together to provide you with the care you need, when you need it.  We recommend signing up for the patient portal called "MyChart".  Sign up information is provided on this After Visit Summary.  MyChart is used to connect with  patients for Virtual Visits (Telemedicine).  Patients are able to view lab/test results, encounter notes, upcoming appointments, etc.  Non-urgent messages can be sent to your provider as well.   To learn more about what you can do with MyChart, go to [email or web address].    Your next appointment:   Follow up as needed  The format for your next appointment:   In Person  Provider:   You may see Sinclair Grooms, MD or one of the following Advanced Practice Providers on your designated Care Team:    Truitt Merle, NP  Cecilie Kicks, NP  Kathyrn Drown, NP        Signed, Sinclair Grooms, MD  08/04/2019 3:25 PM    [place]

## 2019-08-04 ENCOUNTER — Encounter: Payer: Self-pay | Admitting: Interventional Cardiology

## 2019-08-04 ENCOUNTER — Other Ambulatory Visit: Payer: Self-pay

## 2019-08-04 ENCOUNTER — Ambulatory Visit (INDEPENDENT_AMBULATORY_CARE_PROVIDER_SITE_OTHER): Payer: Medicare Other | Admitting: Interventional Cardiology

## 2019-08-04 VITALS — BP 120/70 | HR 91 | Ht 65.0 in | Wt 170.0 lb

## 2019-08-04 DIAGNOSIS — E785 Hyperlipidemia, unspecified: Secondary | ICD-10-CM

## 2019-08-04 DIAGNOSIS — R072 Precordial pain: Secondary | ICD-10-CM | POA: Diagnosis not present

## 2019-08-04 DIAGNOSIS — I10 Essential (primary) hypertension: Secondary | ICD-10-CM

## 2019-08-04 DIAGNOSIS — Z171 Estrogen receptor negative status [ER-]: Secondary | ICD-10-CM | POA: Diagnosis not present

## 2019-08-04 DIAGNOSIS — C50412 Malignant neoplasm of upper-outer quadrant of left female breast: Secondary | ICD-10-CM | POA: Diagnosis not present

## 2019-08-04 DIAGNOSIS — Z7189 Other specified counseling: Secondary | ICD-10-CM

## 2019-08-04 DIAGNOSIS — I700 Atherosclerosis of aorta: Secondary | ICD-10-CM

## 2019-08-04 NOTE — Patient Instructions (Signed)
Medication Instructions:  Your physician recommends that you continue on your current medications as directed. Please refer to the Current Medication list given to you today.  *If you need a refill on your cardiac medications before your next appointment, please call your pharmacy*   Lab Work: None If you have labs (blood work) drawn today and your tests are completely normal, you will receive your results only by: Marland Kitchen MyChart Message (if you have MyChart) OR . A paper copy in the mail If you have any lab test that is abnormal or we need to change your treatment, we will call you to review the results.   Testing/Procedures: None   Follow-Up: At [facility], you and your health needs are our priority.  As part of our continuing mission to provide you with exceptional heart care, we have created designated Provider Care Teams.  These Care Teams include your primary Cardiologist (physician) and Advanced Practice Providers (APPs -  Physician Assistants and Nurse Practitioners) who all work together to provide you with the care you need, when you need it.  We recommend signing up for the patient portal called "MyChart".  Sign up information is provided on this After Visit Summary.  MyChart is used to connect with patients for Virtual Visits (Telemedicine).  Patients are able to view lab/test results, encounter notes, upcoming appointments, etc.  Non-urgent messages can be sent to your provider as well.   To learn more about what you can do with MyChart, go to [email or web address].    Your next appointment:   Follow up as needed  The format for your next appointment:   In Person  Provider:   You may see Sinclair Grooms, MD or one of the following Advanced Practice Providers on your designated Care Team:    Truitt Merle, NP  Cecilie Kicks, NP  Kathyrn Drown, NP

## 2019-08-08 ENCOUNTER — Ambulatory Visit
Admission: RE | Admit: 2019-08-08 | Discharge: 2019-08-08 | Disposition: A | Discharge status: Home / Self Care | Payer: Medicare Other | Source: Ambulatory Visit | Attending: Family Medicine | Admitting: Family Medicine

## 2019-08-08 ENCOUNTER — Ambulatory Visit: Payer: Medicare Other | Admitting: Interventional Cardiology

## 2019-08-08 DIAGNOSIS — K7689 Other specified diseases of liver: Secondary | ICD-10-CM | POA: Diagnosis not present

## 2019-08-08 DIAGNOSIS — N2889 Other specified disorders of kidney and ureter: Secondary | ICD-10-CM | POA: Diagnosis not present

## 2019-08-08 DIAGNOSIS — K760 Fatty (change of) liver, not elsewhere classified: Secondary | ICD-10-CM | POA: Diagnosis not present

## 2019-08-08 DIAGNOSIS — R1010 Upper abdominal pain, unspecified: Secondary | ICD-10-CM

## 2019-08-11 DIAGNOSIS — Z03818 Encounter for observation for suspected exposure to other biological agents ruled out: Secondary | ICD-10-CM | POA: Diagnosis not present

## 2019-08-11 DIAGNOSIS — Z1159 Encounter for screening for other viral diseases: Secondary | ICD-10-CM | POA: Diagnosis not present

## 2019-08-11 DIAGNOSIS — Z20828 Contact with and (suspected) exposure to other viral communicable diseases: Secondary | ICD-10-CM | POA: Diagnosis not present

## 2019-08-11 DIAGNOSIS — Z20822 Contact with and (suspected) exposure to covid-19: Secondary | ICD-10-CM | POA: Diagnosis not present

## 2019-09-06 ENCOUNTER — Ambulatory Visit (INDEPENDENT_AMBULATORY_CARE_PROVIDER_SITE_OTHER): Payer: Medicare Other

## 2019-09-06 ENCOUNTER — Other Ambulatory Visit: Payer: Self-pay

## 2019-09-06 DIAGNOSIS — Z780 Asymptomatic menopausal state: Secondary | ICD-10-CM

## 2019-09-06 DIAGNOSIS — Z0000 Encounter for general adult medical examination without abnormal findings: Secondary | ICD-10-CM | POA: Diagnosis not present

## 2019-09-06 NOTE — Patient Instructions (Signed)
Yolanda Green , Thank you for taking time to come for your Medicare Wellness Visit. I appreciate your ongoing commitment to your health goals. Please review the following plan we discussed and let me know if I can assist you in the future.   Screening recommendations/referrals: Colonoscopy: Up to date, next due 01/12/2025 Mammogram: Up to date, next due 09/28/2019 Bone Density: Currently due, orders placed this visit Recommended yearly ophthalmology/optometry visit for glaucoma screening and checkup Recommended yearly dental visit for hygiene and checkup  Vaccinations: Influenza vaccine: Up to date, next due this fall 74  Pneumococcal vaccine: Currently due, you may receive the first injection at your next office visit Tdap vaccine: Currently due, please contact your insurance to discuss cost or you may until and injury to receive  Shingles vaccine: Currently due, please contact your pharmacy to discuss cost and to receive the vaccine.    Advanced directives: Copies on file   Conditions/risks identified: None  Next appointment: none    Preventive Care 65 Years and Older, Female Preventive care refers to lifestyle choices and visits with your health care provider that can promote health and wellness. What does preventive care include?  A yearly physical exam. This is also called an annual well check.  Dental exams once or twice a year.  Routine eye exams. Ask your health care provider how often you should have your eyes checked.  Personal lifestyle choices, including:  Daily care of your teeth and gums.  Regular physical activity.  Eating a healthy diet.  Avoiding tobacco and drug use.  Limiting alcohol use.  Practicing safe sex.  Taking low-dose aspirin every day.  Taking vitamin and mineral supplements as recommended by your health care provider. What happens during an annual well check? The services and screenings done by your health care provider during your  annual well check will depend on your age, overall health, lifestyle risk factors, and family history of disease. Counseling  Your health care provider may ask you questions about your:  Alcohol use.  Tobacco use.  Drug use.  Emotional well-being.  Home and relationship well-being.  Sexual activity.  Eating habits.  History of falls.  Memory and ability to understand (cognition).  Work and work Statistician.  Reproductive health. Screening  You may have the following tests or measurements:  Height, weight, and BMI.  Blood pressure.  Lipid and cholesterol levels. These may be checked every 5 years, or more frequently if you are over 72 years old.  Skin check.  Lung cancer screening. You may have this screening every year starting at age 72 if you have a 30-pack-year history of smoking and currently smoke or have quit within the past 15 years.  Fecal occult blood test (FOBT) of the stool. You may have this test every year starting at age 72.  Flexible sigmoidoscopy or colonoscopy. You may have a sigmoidoscopy every 5 years or a colonoscopy every 10 years starting at age 72.  Hepatitis C blood test.  Hepatitis B blood test.  Sexually transmitted disease (STD) testing.  Diabetes screening. This is done by checking your blood sugar (glucose) after you have not eaten for a while (fasting). You may have this done every 1-3 years.  Bone density scan. This is done to screen for osteoporosis. You may have this done starting at age 60.  Mammogram. This may be done every 1-2 years. Talk to your health care provider about how often you should have regular mammograms. Talk with your health care provider  about your test results, treatment options, and if necessary, the need for more tests. Vaccines  Your health care provider may recommend certain vaccines, such as:  Influenza vaccine. This is recommended every year.  Tetanus, diphtheria, and acellular pertussis (Tdap, Td)  vaccine. You may need a Td booster every 10 years.  Zoster vaccine. You may need this after age 72.  Pneumococcal 13-valent conjugate (PCV13) vaccine. One dose is recommended after age 72.  Pneumococcal polysaccharide (PPSV23) vaccine. One dose is recommended after age 72. Talk to your health care provider about which screenings and vaccines you need and how often you need them. This information is not intended to replace advice given to you by your health care provider. Make sure you discuss any questions you have with your health care provider. Document Released: 01/26/2015 Document Revised: 09/19/2015 Document Reviewed: 10/31/2014 Elsevier Interactive Patient Education  2017 [place] Prevention in the Home Falls can cause injuries. They can happen to people of all ages. There are many things you can do to make your home safe and to help prevent falls. What can I do on the outside of my home?  Regularly fix the edges of walkways and driveways and fix any cracks.  Remove anything that might make you trip as you walk through a door, such as a raised step or threshold.  Trim any bushes or trees on the path to your home.  Use bright outdoor lighting.  Clear any walking paths of anything that might make someone trip, such as rocks or tools.  Regularly check to see if handrails are loose or broken. Make sure that both sides of any steps have handrails.  Any raised decks and porches should have guardrails on the edges.  Have any leaves, snow, or ice cleared regularly.  Use sand or salt on walking paths during winter.  Clean up any spills in your garage right away. This includes oil or grease spills. What can I do in the bathroom?  Use night lights.  Install grab bars by the toilet and in the tub and shower. Do not use towel bars as grab bars.  Use non-skid mats or decals in the tub or shower.  If you need to sit down in the shower, use a plastic, non-slip  stool.  Keep the floor dry. Clean up any water that spills on the floor as soon as it happens.  Remove soap buildup in the tub or shower regularly.  Attach bath mats securely with double-sided non-slip rug tape.  Do not have throw rugs and other things on the floor that can make you trip. What can I do in the bedroom?  Use night lights.  Make sure that you have a light by your bed that is easy to reach.  Do not use any sheets or blankets that are too big for your bed. They should not hang down onto the floor.  Have a firm chair that has side arms. You can use this for support while you get dressed.  Do not have throw rugs and other things on the floor that can make you trip. What can I do in the kitchen?  Clean up any spills right away.  Avoid walking on wet floors.  Keep items that you use a lot in easy-to-reach places.  If you need to reach something above you, use a strong step stool that has a grab bar.  Keep electrical cords out of the way.  Do not use floor polish or  wax that makes floors slippery. If you must use wax, use non-skid floor wax.  Do not have throw rugs and other things on the floor that can make you trip. What can I do with my stairs?  Do not leave any items on the stairs.  Make sure that there are handrails on both sides of the stairs and use them. Fix handrails that are broken or loose. Make sure that handrails are as long as the stairways.  Check any carpeting to make sure that it is firmly attached to the stairs. Fix any carpet that is loose or worn.  Avoid having throw rugs at the top or bottom of the stairs. If you do have throw rugs, attach them to the floor with carpet tape.  Make sure that you have a light switch at the top of the stairs and the bottom of the stairs. If you do not have them, ask someone to add them for you. What else can I do to help prevent falls?  Wear shoes that:  Do not have high heels.  Have rubber bottoms.  Are  comfortable and fit you well.  Are closed at the toe. Do not wear sandals.  If you use a stepladder:  Make sure that it is fully opened. Do not climb a closed stepladder.  Make sure that both sides of the stepladder are locked into place.  Ask someone to hold it for you, if possible.  Clearly mark and make sure that you can see:  Any grab bars or handrails.  First and last steps.  Where the edge of each step is.  Use tools that help you move around (mobility aids) if they are needed. These include:  Canes.  Walkers.  Scooters.  Crutches.  Turn on the lights when you go into a dark area. Replace any light bulbs as soon as they burn out.  Set up your furniture so you have a clear path. Avoid moving your furniture around.  If any of your floors are uneven, fix them.  If there are any pets around you, be aware of where they are.  Review your medicines with your doctor. Some medicines can make you feel dizzy. This can increase your chance of falling. Ask your doctor what other things that you can do to help prevent falls. This information is not intended to replace advice given to you by your health care provider. Make sure you discuss any questions you have with your health care provider. Document Released: 10/26/2008 Document Revised: 06/07/2015 Document Reviewed: 02/03/2014 Elsevier Interactive Patient Education  2017 [employer].

## 2019-09-06 NOTE — Progress Notes (Signed)
Subjective:   Yolanda Green is a 74 y.o. female who presents for Medicare Annual (Subsequent) preventive examination.  I connected with Destin Kittler today by telephone and verified that I am speaking with the correct person using two identifiers. Location patient: home Location provider: work Persons participating in the virtual visit: patient, provider.   I discussed the limitations, risks, security and privacy concerns of performing an evaluation and management service by telephone and the availability of in person appointments. I also discussed with the patient that there may be a patient responsible charge related to this service. The patient expressed understanding and verbally consented to this telephonic visit.    Interactive audio and video telecommunications were attempted between this provider and patient, however failed, due to patient having technical difficulties OR patient did not have access to video capability.  We continued and completed visit with audio only.      Review of Systems    N/A Cardiac Risk Factors include: advanced age (>42men, >14 women);hypertension;dyslipidemia     Objective:    Today's Vitals   There is no height or weight on file to calculate BMI.  Advanced Directives 09/06/2019 10/13/2018 11/16/2017 04/18/2016  Does Patient Have a Medical Advance Directive? Yes No No No  Type of Paramedic of [place];Living will - - -  Copy of [place] in Chart? Yes - validated most recent copy scanned in chart (See row information) - - -  Would patient like information on creating a medical advance directive? - No - Patient declined - No - Patient declined    Current Medications (verified) Outpatient Encounter Medications as of 09/06/2019  Medication Sig  . amLODipine (NORVASC) 10 MG tablet TAKE 1 TABLET BY MOUTH EVERY DAY  . atorvastatin (LIPITOR) 40 MG tablet TAKE 1 TABLET BY MOUTH EVERY DAY  .  LORazepam (ATIVAN) 0.5 MG tablet Take 1 tablet (0.5 mg total) by mouth at bedtime as needed for sleep. (Patient not taking: Reported on 09/06/2019)  . omeprazole (PRILOSEC) 40 MG capsule Take 1 capsule (40 mg total) by mouth daily. (Patient not taking: Reported on 09/06/2019)  . [DISCONTINUED] dexamethasone (DECADRON) 4 MG tablet Take 1 tablets by mouth daily starting the day after Carboplatin and Cytoxan x 2 days. Take with food. (Patient not taking: Reported on 09/06/2019)  . [DISCONTINUED] lidocaine-prilocaine (EMLA) cream Apply to affected area once (Patient not taking: Reported on 09/06/2019)  . [DISCONTINUED] ondansetron (ZOFRAN) 8 MG tablet Take 1 tablet (8 mg total) by mouth 2 (two) times daily as needed. Start on the third day after chemotherapy. (Patient not taking: Reported on 09/06/2019)  . [DISCONTINUED] prochlorperazine (COMPAZINE) 10 MG tablet Take 1 tablet (10 mg total) by mouth every 6 (six) hours as needed (Nausea or vomiting).  . [DISCONTINUED] sucralfate (CARAFATE) 1 GM/10ML suspension Take 10 mLs (1 g total) by mouth 4 (four) times daily -  with meals and at bedtime.   No facility-administered encounter medications on file as of 09/06/2019.    Allergies (verified) Patient has no known allergies.   History: Past Medical History:  Diagnosis Date  . Cancer ([place])   . Family history of breast cancer   . Family history of prostate cancer   . Family history of stomach cancer   . Fibroids   . H/O varicella   . History of measles, mumps, or rubella   . Hypertension   . PMB (postmenopausal bleeding) 2005  . Stenotic cervical os 07/08/03  . Stress  06/20/09  . Trichomonas 05/08/03  . Yeast infection    Past Surgical History:  Procedure Laterality Date  . BREAST SURGERY    . COLONOSCOPY  2016   per Dr. Collene Mares, precancerous polyps, repeat in 5 yrs   . DILATION AND CURETTAGE OF UTERUS  07/08/03  . HYSTEROSCOPY  07/08/03  . INGUINAL HERNIA REPAIR  1996  . TUBAL LIGATION  1979  . WISDOM  TOOTH EXTRACTION     Family History  Problem Relation Age of Onset  . Prostate cancer Father 44       not metastatic  . Diabetes Sister   . Hearing loss Son   . Breast cancer Paternal Aunt 45       1 aunt with breast cancer  . Depression Maternal Grandmother   . Stomach cancer Paternal Grandfather 56   Social History   Socioeconomic History  . Marital status: Widowed    Spouse name: Not on file  . Number of children: 2  . Years of education: Not on file  . Highest education level: Not on file  Occupational History  . Not on file  Tobacco Use  . Smoking status: Never Smoker  . Smokeless tobacco: Never Used  Vaping Use  . Vaping Use: Never used  Substance and Sexual Activity  . Alcohol use: Yes    Comment: occasional glass of wine  . Drug use: No  . Sexual activity: Not Currently    Birth control/protection: Surgical, Post-menopausal    Comment: BTL  Other Topics Concern  . Not on file  Social History Narrative  . Not on file   Social Determinants of Health   Financial Resource Strain: Low Risk   . Difficulty of Paying Living Expenses: Not hard at all  Food Insecurity: No Food Insecurity  . Worried About Charity fundraiser in the Last Year: Never true  . Ran Out of Food in the Last Year: Never true  Transportation Needs: No Transportation Needs  . Lack of Transportation (Medical): No  . Lack of Transportation (Non-Medical): No  Physical Activity: Sufficiently Active  . Days of Exercise per Week: 7 days  . Minutes of Exercise per Session: 60 min  Stress: No Stress Concern Present  . Feeling of Stress : Not at all  Social Connections: Moderately Integrated  . Frequency of Communication with Friends and Family: More than three times a week  . Frequency of Social Gatherings with Friends and Family: Twice a week  . Attends Religious Services: More than 4 times per year  . Active Member of Clubs or Organizations: Yes  . Attends Archivist Meetings: More  than 4 times per year  . Marital Status: Widowed    Tobacco Counseling Counseling given: Not Answered   Clinical Intake:  Pre-visit preparation completed: Yes  Pain : No/denies pain     Nutritional Risks: None Diabetes: No  How often do you need to have someone help you when you read instructions, pamphlets, or other written materials from your doctor or pharmacy?: 1 - Never What is the last grade level you completed in school?: 2 years college  Diabetic?No  Interpreter Needed?: No  Information entered by :: [place] of Daily Living In your present state of health, do you have any difficulty performing the following activities: 09/06/2019 04/04/2019  Hearing? N N  Vision? N N  Difficulty concentrating or making decisions? N N  Walking or climbing stairs? N N  Dressing or bathing? N  N  Doing errands, shopping? N N  Preparing Food and eating ? N -  Using the Toilet? N -  In the past six months, have you accidently leaked urine? N -  Do you have problems with loss of bowel control? N -  Managing your Medications? N -  Managing your Finances? N -  Housekeeping or managing your Housekeeping? N -  Some recent data might be hidden    Patient Care Team: Laurey Morale, MD as PCP - General (Family Medicine) Belva Crome, MD as PCP - Cardiology (Cardiology) Ena Dawley, MD as Consulting Physician (Obstetrics and Gynecology) Mauro Kaufmann, RN as Oncology Nurse Navigator Rockwell Germany, RN as Oncology Nurse Navigator Nicholas Lose, MD as Consulting Physician (Hematology and Oncology) Eppie Gibson, MD as Attending Physician (Radiation Oncology)  Indicate any recent Medical Services you may have received from other than [facility] providers in the past year (date may be approximate).     Assessment:   This is a routine wellness examination for Yolanda Green.  Hearing/Vision screen  Hearing Screening   125Hz  250Hz  500Hz  1000Hz  2000Hz  3000Hz  4000Hz  6000Hz   8000Hz   Right ear:           Left ear:           Vision Screening Comments: Patient states gets eye checked annually    Dietary issues and exercise activities discussed: Current Exercise Habits: Home exercise routine, Type of exercise: walking;stretching, Time (Minutes): 60, Frequency (Times/Week): 7, Weekly Exercise (Minutes/Week): 420, Intensity: Mild  Goals    . Patient Stated     I will continue to walk 0.5- 2 miles every morning     . Patient Stated     I would like to stay healthy and safe and continue writing poems      Depression Screen PHQ 2/9 Scores 09/06/2019 01/29/2018 12/18/2017 09/23/2017 04/20/2017 01/17/2017 04/26/2016  PHQ - 2 Score 0 0 0 0 0 0 0  PHQ- 9 Score 0 - - - - - -    Fall Risk Fall Risk  09/06/2019 04/04/2019 01/29/2018 12/18/2017 11/23/2017  Falls in the past year? 0 0 0 0 0  Number falls in past yr: 0 - - - -  Injury with Fall? 0 - - - -  Risk for fall due to : Medication side effect - - - -  Follow up Falls evaluation completed;Falls prevention discussed - - - -    Any stairs in or around the home? Yes  If so, are there any without handrails? No  Home free of loose throw rugs in walkways, pet beds, electrical cords, etc? Yes  Adequate lighting in your home to reduce risk of falls? Yes   ASSISTIVE DEVICES UTILIZED TO PREVENT FALLS:  Life alert? No  Use of a cane, walker or w/c? No  Grab bars in the bathroom? No  Shower chair or bench in shower? No  Elevated toilet seat or a handicapped toilet? No     Cognitive Function:  Cognitive screening not indicated based on direct observation.       Immunizations Immunization History  Administered Date(s) Administered  . PFIZER SARS-COV-2 Vaccination 06/02/2019, 06/23/2019    TDAP status: Due, Education has been provided regarding the importance of this vaccine. Advised may receive this vaccine at local pharmacy or Health Dept. Aware to provide a copy of the vaccination record if obtained from local  pharmacy or Health Dept. Verbalized acceptance and understanding. Flu Vaccine status: Up to date Pneumococcal vaccine  status: Declined,  Education has been provided regarding the importance of this vaccine but patient still declined. Advised may receive this vaccine at local pharmacy or Health Dept. Aware to provide a copy of the vaccination record if obtained from local pharmacy or Health Dept. Verbalized acceptance and understanding.  Covid-19 vaccine status: Completed vaccines  Qualifies for Shingles Vaccine? Yes   Zostavax completed No   Shingrix Completed?: No.    Education has been provided regarding the importance of this vaccine. Patient has been advised to call insurance company to determine out of pocket expense if they have not yet received this vaccine. Advised may also receive vaccine at local pharmacy or Health Dept. Verbalized acceptance and understanding.  Screening Tests Health Maintenance  Topic Date Due  . PNA vac Low Risk Adult (2 of 2 - PPSV23) 10/12/2016  . INFLUENZA VACCINE  08/14/2019  . MAMMOGRAM  09/27/2020  . COLONOSCOPY  01/13/2025  . TETANUS/TDAP  10/13/2026  . DEXA SCAN  Completed  . COVID-19 Vaccine  Completed  . Hepatitis C Screening  Completed    Health Maintenance  Health Maintenance Due  Topic Date Due  . PNA vac Low Risk Adult (2 of 2 - PPSV23) 10/12/2016  . INFLUENZA VACCINE  08/14/2019    Colorectal cancer screening: Completed 01/14/2015. Repeat every 10 years Mammogram status: Completed 09/28/2018. Repeat every year Bone Density status: Ordered 09/06/2019. Pt provided with contact info and advised to call to schedule appt.  Lung Cancer Screening: (Low Dose CT Chest recommended if Age 59-80 years, 30 pack-year currently smoking OR have quit w/in 15years.) does not qualify.   Lung Cancer Screening Referral: N/A  Additional Screening:  Hepatitis C Screening: does qualify; Completed 01/17/2017  Vision Screening: Recommended annual  ophthalmology exams for early detection of glaucoma and other disorders of the eye. Is the patient up to date with their annual eye exam?  Yes  Who is the provider or what is the name of the office in which the patient attends annual eye exams? [facility]  If pt is not established with a provider, would they like to be referred to a provider to establish care? No .   Dental Screening: Recommended annual dental exams for proper oral hygiene  Community Resource Referral / Chronic Care Management: CRR required this visit?  No   CCM required this visit?  No      Plan:     I have personally reviewed and noted the following in the patient's chart:   . Medical and social history . Use of alcohol, tobacco or illicit drugs  . Current medications and supplements . Functional ability and status . Nutritional status . Physical activity . Advanced directives . List of other physicians . Hospitalizations, surgeries, and ER visits in previous 12 months . Vitals . Screenings to include cognitive, depression, and falls . Referrals and appointments  In addition, I have reviewed and discussed with patient certain preventive protocols, quality metrics, and best practice recommendations. A written personalized care plan for preventive services as well as general preventive health recommendations were provided to patient.     Ofilia Neas, LPN   [id number]/[id number]   Nurse Notes: None

## 2019-09-12 ENCOUNTER — Other Ambulatory Visit: Payer: Self-pay | Admitting: Family Medicine

## 2019-09-12 DIAGNOSIS — E2839 Other primary ovarian failure: Secondary | ICD-10-CM

## 2019-09-14 ENCOUNTER — Other Ambulatory Visit: Payer: Medicare Other

## 2019-09-21 ENCOUNTER — Other Ambulatory Visit: Payer: Medicare Other

## 2019-09-26 DIAGNOSIS — Z171 Estrogen receptor negative status [ER-]: Secondary | ICD-10-CM | POA: Diagnosis not present

## 2019-09-26 DIAGNOSIS — C50812 Malignant neoplasm of overlapping sites of left female breast: Secondary | ICD-10-CM | POA: Diagnosis not present

## 2019-09-26 DIAGNOSIS — R928 Other abnormal and inconclusive findings on diagnostic imaging of breast: Secondary | ICD-10-CM | POA: Diagnosis not present

## 2019-09-30 DIAGNOSIS — R1013 Epigastric pain: Secondary | ICD-10-CM | POA: Diagnosis not present

## 2019-09-30 DIAGNOSIS — Z86010 Personal history of colonic polyps: Secondary | ICD-10-CM | POA: Diagnosis not present

## 2019-09-30 DIAGNOSIS — C50812 Malignant neoplasm of overlapping sites of left female breast: Secondary | ICD-10-CM | POA: Diagnosis not present

## 2019-09-30 DIAGNOSIS — D122 Benign neoplasm of ascending colon: Secondary | ICD-10-CM | POA: Diagnosis not present

## 2019-09-30 DIAGNOSIS — D126 Benign neoplasm of colon, unspecified: Secondary | ICD-10-CM | POA: Diagnosis not present

## 2019-09-30 DIAGNOSIS — I10 Essential (primary) hypertension: Secondary | ICD-10-CM | POA: Diagnosis not present

## 2019-09-30 DIAGNOSIS — N2889 Other specified disorders of kidney and ureter: Secondary | ICD-10-CM | POA: Diagnosis not present

## 2019-09-30 DIAGNOSIS — Z1211 Encounter for screening for malignant neoplasm of colon: Secondary | ICD-10-CM | POA: Diagnosis not present

## 2019-09-30 DIAGNOSIS — D123 Benign neoplasm of transverse colon: Secondary | ICD-10-CM | POA: Diagnosis not present

## 2019-09-30 DIAGNOSIS — E785 Hyperlipidemia, unspecified: Secondary | ICD-10-CM | POA: Diagnosis not present

## 2019-09-30 DIAGNOSIS — Z79899 Other long term (current) drug therapy: Secondary | ICD-10-CM | POA: Diagnosis not present

## 2019-09-30 DIAGNOSIS — K649 Unspecified hemorrhoids: Secondary | ICD-10-CM | POA: Diagnosis not present

## 2019-09-30 DIAGNOSIS — K317 Polyp of stomach and duodenum: Secondary | ICD-10-CM | POA: Diagnosis not present

## 2019-09-30 DIAGNOSIS — Z9221 Personal history of antineoplastic chemotherapy: Secondary | ICD-10-CM | POA: Diagnosis not present

## 2019-09-30 DIAGNOSIS — K5730 Diverticulosis of large intestine without perforation or abscess without bleeding: Secondary | ICD-10-CM | POA: Diagnosis not present

## 2019-09-30 DIAGNOSIS — Z171 Estrogen receptor negative status [ER-]: Secondary | ICD-10-CM | POA: Diagnosis not present

## 2019-09-30 DIAGNOSIS — K209 Esophagitis, unspecified without bleeding: Secondary | ICD-10-CM | POA: Diagnosis not present

## 2019-09-30 DIAGNOSIS — K648 Other hemorrhoids: Secondary | ICD-10-CM | POA: Diagnosis not present

## 2019-09-30 HISTORY — PX: COLONOSCOPY: SHX174

## 2019-09-30 HISTORY — PX: ESOPHAGOGASTRODUODENOSCOPY: SHX1529

## 2019-10-03 ENCOUNTER — Ambulatory Visit: Payer: Medicare Other | Admitting: Interventional Cardiology

## 2019-10-19 DIAGNOSIS — N2889 Other specified disorders of kidney and ureter: Secondary | ICD-10-CM | POA: Diagnosis not present

## 2019-11-08 ENCOUNTER — Other Ambulatory Visit: Payer: Self-pay | Admitting: Family Medicine

## 2019-11-08 DIAGNOSIS — I10 Essential (primary) hypertension: Secondary | ICD-10-CM

## 2019-11-08 NOTE — Telephone Encounter (Signed)
Patient called, left VM to return the call to the office to schedule a physical.

## 2019-11-15 DIAGNOSIS — N2889 Other specified disorders of kidney and ureter: Secondary | ICD-10-CM | POA: Diagnosis not present

## 2019-11-16 DIAGNOSIS — Z171 Estrogen receptor negative status [ER-]: Secondary | ICD-10-CM | POA: Diagnosis not present

## 2019-11-16 DIAGNOSIS — Z853 Personal history of malignant neoplasm of breast: Secondary | ICD-10-CM | POA: Diagnosis not present

## 2019-11-16 DIAGNOSIS — C50812 Malignant neoplasm of overlapping sites of left female breast: Secondary | ICD-10-CM | POA: Diagnosis not present

## 2019-11-16 DIAGNOSIS — Z08 Encounter for follow-up examination after completed treatment for malignant neoplasm: Secondary | ICD-10-CM | POA: Diagnosis not present

## 2019-12-12 ENCOUNTER — Other Ambulatory Visit: Payer: Self-pay | Admitting: Family Medicine

## 2019-12-12 DIAGNOSIS — I10 Essential (primary) hypertension: Secondary | ICD-10-CM

## 2019-12-15 ENCOUNTER — Ambulatory Visit: Payer: Medicare Other | Admitting: Family Medicine

## 2019-12-20 ENCOUNTER — Ambulatory Visit: Payer: Medicare Other | Admitting: Family Medicine

## 2019-12-20 ENCOUNTER — Telehealth: Payer: Self-pay | Admitting: Family Medicine

## 2019-12-20 NOTE — Telephone Encounter (Signed)
Per patient would like a call from the nurse. pt would not give any other information about her call [phone]

## 2019-12-23 ENCOUNTER — Encounter: Payer: Self-pay | Admitting: Family Medicine

## 2019-12-23 ENCOUNTER — Other Ambulatory Visit: Payer: Self-pay

## 2019-12-23 ENCOUNTER — Ambulatory Visit (INDEPENDENT_AMBULATORY_CARE_PROVIDER_SITE_OTHER): Payer: Medicare Other | Admitting: Family Medicine

## 2019-12-23 VITALS — BP 144/86 | HR 93 | Temp 98.1°F | Ht 65.0 in | Wt 173.6 lb

## 2019-12-23 DIAGNOSIS — I10 Essential (primary) hypertension: Secondary | ICD-10-CM | POA: Diagnosis not present

## 2019-12-23 MED ORDER — AMLODIPINE BESYLATE 10 MG PO TABS: 10.0000 mg | ORAL_TABLET | Freq: Every day | ORAL | 11 refills | Status: DC

## 2019-12-23 NOTE — Telephone Encounter (Signed)
Patient had appt today with provider.  Dm/cma

## 2019-12-23 NOTE — Progress Notes (Signed)
   Subjective:    Patient ID: Yolanda Green, female    DOB: 1945-05-01, 74 y.o.   MRN: [id number]  HPI Here to follow up. She feels well. Her BP is stable at home.    Review of Systems  Constitutional: Negative.   Respiratory: Negative.   Cardiovascular: Negative.        Objective:   Physical Exam Constitutional:      Appearance: Normal appearance.  Cardiovascular:     Rate and Rhythm: Normal rate and regular rhythm.     Pulses: Normal pulses.     Heart sounds: Normal heart sounds.  Pulmonary:     Effort: Pulmonary effort is normal.     Breath sounds: Normal breath sounds.  Musculoskeletal:     Right lower leg: No edema.     Left lower leg: No edema.  Neurological:     Mental Status: She is alert.           Assessment & Plan:  HTN, stable. meds were refilled.  Alysia Penna, MD

## 2020-01-02 ENCOUNTER — Other Ambulatory Visit: Payer: Medicare Other

## 2020-01-15 ENCOUNTER — Other Ambulatory Visit: Payer: Self-pay | Admitting: Family Medicine

## 2020-01-15 DIAGNOSIS — I10 Essential (primary) hypertension: Secondary | ICD-10-CM

## 2020-02-21 ENCOUNTER — Other Ambulatory Visit: Payer: Self-pay | Admitting: Family Medicine

## 2020-02-21 ENCOUNTER — Other Ambulatory Visit: Payer: Self-pay

## 2020-02-21 ENCOUNTER — Telehealth: Payer: Self-pay | Admitting: Family Medicine

## 2020-02-21 DIAGNOSIS — I10 Essential (primary) hypertension: Secondary | ICD-10-CM

## 2020-02-21 DIAGNOSIS — E785 Hyperlipidemia, unspecified: Secondary | ICD-10-CM

## 2020-02-21 MED ORDER — ATORVASTATIN CALCIUM 40 MG PO TABS: 40.0000 mg | ORAL_TABLET | Freq: Every day | ORAL | 0 refills | Status: DC

## 2020-02-21 MED ORDER — AMLODIPINE BESYLATE 10 MG PO TABS: 10.0000 mg | ORAL_TABLET | Freq: Every day | ORAL | 0 refills | Status: DC

## 2020-02-21 NOTE — Telephone Encounter (Signed)
Pt is calling in needing a refill on Rx amlodipine (NORVASC) 10 MG and she is needing her atorvastatin (LIPITOR) 40 MG [facility]  [facility] [address] [address]

## 2020-02-21 NOTE — Telephone Encounter (Signed)
[facility]ills [facility] to [facility] on [address]

## 2020-04-10 ENCOUNTER — Other Ambulatory Visit: Payer: Medicare Other

## 2020-04-13 DIAGNOSIS — R140 Abdominal distension (gaseous): Secondary | ICD-10-CM | POA: Diagnosis not present

## 2020-04-13 DIAGNOSIS — R1084 Generalized abdominal pain: Secondary | ICD-10-CM | POA: Diagnosis not present

## 2020-04-13 DIAGNOSIS — N2889 Other specified disorders of kidney and ureter: Secondary | ICD-10-CM | POA: Diagnosis not present

## 2020-04-16 DIAGNOSIS — Z9012 Acquired absence of left breast and nipple: Secondary | ICD-10-CM | POA: Diagnosis not present

## 2020-04-16 DIAGNOSIS — R202 Paresthesia of skin: Secondary | ICD-10-CM | POA: Diagnosis not present

## 2020-04-16 DIAGNOSIS — Z171 Estrogen receptor negative status [ER-]: Secondary | ICD-10-CM | POA: Diagnosis not present

## 2020-04-16 DIAGNOSIS — Z09 Encounter for follow-up examination after completed treatment for conditions other than malignant neoplasm: Secondary | ICD-10-CM | POA: Diagnosis not present

## 2020-04-16 DIAGNOSIS — Z853 Personal history of malignant neoplasm of breast: Secondary | ICD-10-CM | POA: Diagnosis not present

## 2020-04-16 DIAGNOSIS — C50812 Malignant neoplasm of overlapping sites of left female breast: Secondary | ICD-10-CM | POA: Diagnosis not present

## 2020-04-21 ENCOUNTER — Ambulatory Visit
Admission: RE | Admit: 2020-04-21 | Discharge: 2020-04-21 | Disposition: A | Discharge status: Home / Self Care | Payer: Medicare Other | Source: Ambulatory Visit | Attending: Family Medicine | Admitting: Family Medicine

## 2020-04-21 ENCOUNTER — Other Ambulatory Visit: Payer: Self-pay

## 2020-04-21 DIAGNOSIS — E2839 Other primary ovarian failure: Secondary | ICD-10-CM

## 2020-04-21 DIAGNOSIS — Z780 Asymptomatic menopausal state: Secondary | ICD-10-CM | POA: Diagnosis not present

## 2020-04-21 DIAGNOSIS — M8589 Other specified disorders of bone density and structure, multiple sites: Secondary | ICD-10-CM | POA: Diagnosis not present

## 2020-05-16 DIAGNOSIS — R140 Abdominal distension (gaseous): Secondary | ICD-10-CM | POA: Diagnosis not present

## 2020-05-16 DIAGNOSIS — N2889 Other specified disorders of kidney and ureter: Secondary | ICD-10-CM | POA: Diagnosis not present

## 2020-06-02 DIAGNOSIS — Z23 Encounter for immunization: Secondary | ICD-10-CM | POA: Diagnosis not present

## 2020-06-14 ENCOUNTER — Other Ambulatory Visit: Payer: Self-pay | Admitting: Family Medicine

## 2020-06-14 DIAGNOSIS — I10 Essential (primary) hypertension: Secondary | ICD-10-CM

## 2020-07-18 ENCOUNTER — Other Ambulatory Visit: Payer: Self-pay

## 2020-07-19 ENCOUNTER — Ambulatory Visit (INDEPENDENT_AMBULATORY_CARE_PROVIDER_SITE_OTHER): Payer: Medicare Other | Admitting: Family Medicine

## 2020-07-19 ENCOUNTER — Encounter: Payer: Self-pay | Admitting: Family Medicine

## 2020-07-19 VITALS — BP 128/82 | HR 92 | Temp 98.0°F | Ht 65.0 in | Wt 174.0 lb

## 2020-07-19 DIAGNOSIS — K219 Gastro-esophageal reflux disease without esophagitis: Secondary | ICD-10-CM | POA: Insufficient documentation

## 2020-07-19 DIAGNOSIS — K2100 Gastro-esophageal reflux disease with esophagitis, without bleeding: Secondary | ICD-10-CM | POA: Diagnosis not present

## 2020-07-19 DIAGNOSIS — E785 Hyperlipidemia, unspecified: Secondary | ICD-10-CM | POA: Diagnosis not present

## 2020-07-19 DIAGNOSIS — E538 Deficiency of other specified B group vitamins: Secondary | ICD-10-CM

## 2020-07-19 DIAGNOSIS — I10 Essential (primary) hypertension: Secondary | ICD-10-CM | POA: Diagnosis not present

## 2020-07-19 DIAGNOSIS — R739 Hyperglycemia, unspecified: Secondary | ICD-10-CM | POA: Diagnosis not present

## 2020-07-19 LAB — HEMOGLOBIN A1C: Hgb A1c MFr Bld: 5.8 % (ref 4.6–6.5)

## 2020-07-19 LAB — T3, FREE: T3, Free: 3.5 pg/mL (ref 2.3–4.2)

## 2020-07-19 LAB — HEPATIC FUNCTION PANEL
ALT: 15 U/L (ref 0–35)
AST: 17 U/L (ref 0–37)
Albumin: 4.2 g/dL (ref 3.5–5.2)
Alkaline Phosphatase: 76 U/L (ref 39–117)
Bilirubin, Direct: 0.1 mg/dL (ref 0.0–0.3)
Total Bilirubin: 0.7 mg/dL (ref 0.2–1.2)
Total Protein: 7.1 g/dL (ref 6.0–8.3)

## 2020-07-19 LAB — BASIC METABOLIC PANEL
BUN: 15 mg/dL (ref 6–23)
CO2: 25 mEq/L (ref 19–32)
Calcium: 9.6 mg/dL (ref 8.4–10.5)
Chloride: 105 mEq/L (ref 96–112)
Creatinine, Ser: 1.01 mg/dL (ref 0.40–1.20)
GFR: 55.5 mL/min — ABNORMAL LOW (ref >60.00)
Glucose, Bld: 84 mg/dL (ref 70–99)
Potassium: 4.3 mEq/L (ref 3.5–5.1)
Sodium: 138 mEq/L (ref 135–145)

## 2020-07-19 LAB — LIPID PANEL
Cholesterol: 261 mg/dL — ABNORMAL HIGH (ref 0–200)
HDL: 60.1 mg/dL (ref >39.00)
LDL Cholesterol: 182 mg/dL — ABNORMAL HIGH (ref 0–99)
NonHDL: 201.06
Total CHOL/HDL Ratio: 4
Triglycerides: 93 mg/dL (ref 0.0–149.0)
VLDL: 18.6 mg/dL (ref 0.0–40.0)

## 2020-07-19 LAB — CBC WITH DIFFERENTIAL/PLATELET
Basophils Absolute: 0 10*3/uL (ref 0.0–0.1)
Basophils Relative: 0.6 % (ref 0.0–3.0)
Eosinophils Absolute: 0.1 10*3/uL (ref 0.0–0.7)
Eosinophils Relative: 1 % (ref 0.0–5.0)
HCT: 41.1 % (ref 36.0–46.0)
Hemoglobin: 14.1 g/dL (ref 12.0–15.0)
Lymphocytes Relative: 38.4 % (ref 12.0–46.0)
Lymphs Abs: 2 10*3/uL (ref 0.7–4.0)
MCHC: 34.2 g/dL (ref 30.0–36.0)
MCV: 98 fl (ref 78.0–100.0)
Monocytes Absolute: 0.4 10*3/uL (ref 0.1–1.0)
Monocytes Relative: 7.1 % (ref 3.0–12.0)
Neutro Abs: 2.7 10*3/uL (ref 1.4–7.7)
Neutrophils Relative %: 52.9 % (ref 43.0–77.0)
Platelets: 212 10*3/uL (ref 150.0–400.0)
RBC: 4.19 Mil/uL (ref 3.87–5.11)
RDW: 14.1 % (ref 11.5–15.5)
WBC: 5.2 10*3/uL (ref 4.0–10.5)

## 2020-07-19 LAB — T4, FREE: Free T4: 0.9 ng/dL (ref 0.60–1.60)

## 2020-07-19 LAB — TSH: TSH: 1.9 u[IU]/mL (ref 0.35–5.50)

## 2020-07-19 LAB — VITAMIN B12: Vitamin B-12: 366 pg/mL (ref 211–911)

## 2020-07-19 MED ORDER — ATORVASTATIN CALCIUM 40 MG PO TABS: 40.0000 mg | ORAL_TABLET | Freq: Every day | ORAL | 3 refills | Status: DC

## 2020-07-19 MED ORDER — AMLODIPINE BESYLATE 10 MG PO TABS: 10.0000 mg | ORAL_TABLET | Freq: Every day | ORAL | 3 refills | Status: DC

## 2020-07-19 MED ORDER — FAMOTIDINE 40 MG PO TABS: ORAL_TABLET | ORAL | 3 refills | Status: DC

## 2020-07-19 MED ORDER — OMEPRAZOLE 40 MG PO CPDR: DELAYED_RELEASE_CAPSULE | ORAL | 0 refills | Status: DC

## 2020-07-19 NOTE — Progress Notes (Signed)
Subjective:    Patient ID: Yolanda Green, female    DOB: August 12, 1945, 75 y.o.   MRN: [id number]  HPI Here to follow up on issues. She has been doing well except for intermittent upper abdominal pains. She had upper and lower endoscopy last September at [facility], and she was advised to take Omeprazole 40 mg BID. However she has only been taking this once a day. She feels better but the pains have persisted. Her BMs are normal. Her BP has been stable. Also she has been having numbness and tingling in both hands and the left foot for several months. She is S/P chemotherapy for breast cancer, and her oncologist told her that the chemotherapy drugs can cause neuropathy. This is not painful for her.    Review of Systems  Constitutional: Negative.   HENT: Negative.    Eyes: Negative.   Respiratory: Negative.    Cardiovascular: Negative.   Gastrointestinal: Negative.   Genitourinary:  Negative for decreased urine volume, difficulty urinating, dyspareunia, dysuria, enuresis, flank pain, frequency, hematuria, pelvic pain and urgency.  Musculoskeletal: Negative.   Skin: Negative.   Neurological: Negative.  Negative for headaches.  Psychiatric/Behavioral: Negative.        Objective:   Physical Exam Constitutional:      General: She is not in acute distress.    Appearance: Normal appearance. She is well-developed.  HENT:     Head: Normocephalic and atraumatic.     Right Ear: External ear normal.     Left Ear: External ear normal.     Nose: Nose normal.     Mouth/Throat:     Pharynx: No oropharyngeal exudate.  Eyes:     General: No scleral icterus.    Conjunctiva/sclera: Conjunctivae normal.     Pupils: Pupils are equal, round, and reactive to light.  Neck:     Thyroid: No thyromegaly.     Vascular: No JVD.  Cardiovascular:     Rate and Rhythm: Normal rate and regular rhythm.     Heart sounds: Normal heart sounds. No murmur heard.   No friction rub. No gallop.  Pulmonary:      Effort: Pulmonary effort is normal. No respiratory distress.     Breath sounds: Normal breath sounds. No wheezing or rales.  Chest:     Chest wall: No tenderness.  Abdominal:     General: Bowel sounds are normal. There is no distension.     Palpations: Abdomen is soft. There is no mass.     Tenderness: There is no abdominal tenderness. There is no guarding or rebound.  Musculoskeletal:        General: No tenderness. Normal range of motion.     Cervical back: Normal range of motion and neck supple.  Lymphadenopathy:     Cervical: No cervical adenopathy.  Skin:    General: Skin is warm and dry.     Findings: No erythema or rash.  Neurological:     Mental Status: She is alert and oriented to person, place, and time.     Cranial Nerves: No cranial nerve deficit.     Motor: No abnormal muscle tone.     Coordination: Coordination normal.     Deep Tendon Reflexes: Reflexes are normal and symmetric. Reflexes normal.  Psychiatric:        Behavior: Behavior normal.        Thought Content: Thought content normal.        Judgment: Judgment normal.  Assessment & Plan:  Her HTN is stable we will get fasting labs to check her lipids. She has GERD which is not fully treated. We agreed that she will continue to take Omeprazole 40 mg every morning but we will add Pepcid 40 mg every evening. For the neuropathy, we will check for other possible etiologies such as B12 deficiency, diabetes, etc. We spent 35 minutes reviewing her records and discussing these issues.  Alysia Penna, MD

## 2020-07-24 ENCOUNTER — Telehealth: Payer: Self-pay | Admitting: Family Medicine

## 2020-07-24 NOTE — Telephone Encounter (Signed)
Patient returned the call for lab results

## 2020-07-25 NOTE — Telephone Encounter (Signed)
Spoke with patient on 07/24/20, about lab results

## 2020-09-06 ENCOUNTER — Ambulatory Visit (INDEPENDENT_AMBULATORY_CARE_PROVIDER_SITE_OTHER): Payer: Medicare Other

## 2020-09-06 DIAGNOSIS — Z0000 Encounter for general adult medical examination without abnormal findings: Secondary | ICD-10-CM | POA: Diagnosis not present

## 2020-09-06 NOTE — Progress Notes (Signed)
Subjective:   Hikari Dieker is a 75 y.o. female who presents for Medicare Annual (Subsequent) preventive examination.   I connected with Leaann Ranck today by telephone and verified that I am speaking with the correct person using two identifiers. Location patient: home Location provider: work Persons participating in the virtual visit: patient, provider.   I discussed the limitations, risks, security and privacy concerns of performing an evaluation and management service by telephone and the availability of in person appointments. I also discussed with the patient that there may be a patient responsible charge related to this service. The patient expressed understanding and verbally consented to this telephonic visit.    Interactive audio and video telecommunications were attempted between this provider and patient, however failed, due to patient having technical difficulties OR patient did not have access to video capability.  We continued and completed visit with audio only.    Review of Systems    N/a       Objective:    There were no vitals filed for this visit. There is no height or weight on file to calculate BMI.  Advanced Directives 09/06/2019 10/13/2018 11/16/2017 04/18/2016  Does Patient Have a Medical Advance Directive? Yes No No No  Type of Paramedic of [place];Living will - - -  Copy of [place] in Chart? Yes - validated most recent copy scanned in chart (See row information) - - -  Would patient like information on creating a medical advance directive? - No - Patient declined - No - Patient declined    Current Medications (verified) Outpatient Encounter Medications as of 09/06/2020  Medication Sig   amLODipine (NORVASC) 10 MG tablet Take 1 tablet (10 mg total) by mouth daily.   atorvastatin (LIPITOR) 40 MG tablet Take 1 tablet (40 mg total) by mouth daily.   famotidine (PEPCID) 40 MG tablet Take one every  evening   omeprazole (PRILOSEC) 40 MG capsule Take one every morning   No facility-administered encounter medications on file as of 09/06/2020.    Allergies (verified) Patient has no known allergies.   History: Past Medical History:  Diagnosis Date   Cancer ([place])    Family history of breast cancer    Family history of prostate cancer    Family history of stomach cancer    Fibroids    H/O varicella    History of measles, mumps, or rubella    Hypertension    PMB (postmenopausal bleeding) 2005   Stenotic cervical os 07/08/03   Stress 06/20/09   Trichomonas 05/08/03   Yeast infection    Past Surgical History:  Procedure Laterality Date   BREAST SURGERY     COLONOSCOPY N/A 09/30/2019   per Dr. Dorris Singh of [facility], precancerous polyps, repeat in 5 yrs   DILATION AND CURETTAGE OF UTERUS  07/08/2003   ESOPHAGOGASTRODUODENOSCOPY N/A 09/30/2019   per Dr. Dorris Singh of [facility], esophagitis, benign gastric polyps, neg H pylori   HYSTEROSCOPY  07/08/2003   INGUINAL HERNIA REPAIR  1996   TUBAL LIGATION  1979   WISDOM TOOTH EXTRACTION     Family History  Problem Relation Age of Onset   Prostate cancer Father 46       not metastatic   Diabetes Sister    Hearing loss Son    Breast cancer Paternal Aunt 23       1 aunt with breast cancer   Depression Maternal Grandmother    Stomach cancer Paternal  Grandfather 57   Social History   Socioeconomic History   Marital status: Widowed    Spouse name: Not on file   Number of children: 2   Years of education: Not on file   Highest education level: Not on file  Occupational History   Not on file  Tobacco Use   Smoking status: Never   Smokeless tobacco: Never  Vaping Use   Vaping Use: Never used  Substance and Sexual Activity   Alcohol use: Yes    Comment: occasional glass of wine   Drug use: No   Sexual activity: Not Currently    Birth control/protection: Surgical, Post-menopausal    Comment: BTL  Other Topics Concern    Not on file  Social History Narrative   Not on file   Social Determinants of Health   Financial Resource Strain: Low Risk    Difficulty of Paying Living Expenses: Not hard at all  Food Insecurity: No Food Insecurity   Worried About Charity fundraiser in the Last Year: Never true   [place] in the Last Year: Never true  Transportation Needs: No Transportation Needs   Lack of Transportation (Medical): No   Lack of Transportation (Non-Medical): No  Physical Activity: Sufficiently Active   Days of Exercise per Week: 7 days   Minutes of Exercise per Session: 60 min  Stress: No Stress Concern Present   Feeling of Stress : Not at all  Social Connections: Moderately Integrated   Frequency of Communication with Friends and Family: More than three times a week   Frequency of Social Gatherings with Friends and Family: Twice a week   Attends Religious Services: More than 4 times per year   Active Member of [employer] or Organizations: Yes   Attends Archivist Meetings: More than 4 times per year   Marital Status: Widowed    Tobacco Counseling Counseling given: Not Answered   Clinical Intake:                 Diabetic?no         Activities of Daily Living No flowsheet data found.  Patient Care Team: Laurey Morale, MD as PCP - General (Family Medicine) Belva Crome, MD as PCP - Cardiology (Cardiology) Ena Dawley, MD as Consulting Physician (Obstetrics and Gynecology) Mauro Kaufmann, RN as Oncology Nurse Navigator Rockwell Germany, RN as Oncology Nurse Navigator Nicholas Lose, MD as Consulting Physician (Hematology and Oncology) Eppie Gibson, MD as Attending Physician (Radiation Oncology)  Indicate any recent Medical Services you may have received from other than [facility] providers in the past year (date may be approximate).     Assessment:   This is a routine wellness examination for Iolani.  Hearing/Vision screen No results found.  Dietary  issues and exercise activities discussed:     Goals Addressed   None    Depression Screen PHQ 2/9 Scores 07/19/2020 09/06/2019 01/29/2018 12/18/2017 09/23/2017 04/20/2017 01/17/2017  PHQ - 2 Score 0 0 0 0 0 0 0  PHQ- 9 Score 4 0 - - - - -    Fall Risk Fall Risk  07/19/2020 07/19/2020 09/06/2019 04/04/2019 01/29/2018  Falls in the past year? 0 0 0 0 0  Number falls in past yr: 0 0 0 - -  Injury with Fall? 0 0 0 - -  Risk for fall due to : No Fall Risks No Fall Risks Medication side effect - -  Follow up - - Falls evaluation  completed;Falls prevention discussed - -    FALL RISK PREVENTION PERTAINING TO THE HOME:  Any stairs in or around the home? Yes  If so, are there any without handrails? No  Home free of loose throw rugs in walkways, pet beds, electrical cords, etc? Yes  Adequate lighting in your home to reduce risk of falls? Yes   ASSISTIVE DEVICES UTILIZED TO PREVENT FALLS:  Life alert? No  Use of a cane, walker or w/c? No  Grab bars in the bathroom? Yes  Shower chair or bench in shower? No  Elevated toilet seat or a handicapped toilet? No    Cognitive Function:    Normal cognitive status assessed by direct observation by this Nurse Health Advisor. No abnormalities found.      Immunizations Immunization History  Administered Date(s) Administered   Influenza-Unspecified 11/29/2019   PFIZER(Purple Top)SARS-COV-2 Vaccination 06/02/2019, 06/23/2019    TDAP status: Up to date  Flu Vaccine status: Up to date  Pneumococcal vaccine status: Due, Education has been provided regarding the importance of this vaccine. Advised may receive this vaccine at local pharmacy or Health Dept. Aware to provide a copy of the vaccination record if obtained from local pharmacy or Health Dept. Verbalized acceptance and understanding.  Covid-19 vaccine status: Completed vaccines  Qualifies for Shingles Vaccine? Yes   Zostavax completed No   Shingrix Completed?: No.    Education has been provided  regarding the importance of this vaccine. Patient has been advised to call insurance company to determine out of pocket expense if they have not yet received this vaccine. Advised may also receive vaccine at local pharmacy or Health Dept. Verbalized acceptance and understanding.  Screening Tests Health Maintenance  Topic Date Due   Zoster Vaccines- Shingrix (1 of 2) Never done   PNA vac Low Risk Adult (2 of 2 - PPSV23) 10/12/2016   COVID-19 Vaccine (4 - Booster for Pfizer series) 09/02/2020   INFLUENZA VACCINE  08/13/2020   MAMMOGRAM  09/27/2020   COLONOSCOPY (Pts 45-14yr Insurance coverage will need to be confirmed)  01/13/2025   TETANUS/TDAP  10/13/2026   DEXA SCAN  Completed   Hepatitis C Screening  Completed   HPV VACCINES  Aged Out    Health Maintenance  Health Maintenance Due  Topic Date Due   Zoster Vaccines- Shingrix (1 of 2) Never done   PNA vac Low Risk Adult (2 of 2 - PPSV23) 10/12/2016   COVID-19 Vaccine (4 - Booster for Pfizer series) 09/02/2020   INFLUENZA VACCINE  08/13/2020    Colorectal cancer screening: Type of screening: Colonoscopy. Completed 01/14/2015. Repeat every 10 years  Mammogram status: Completed scheduled on 09/20/2020. Repeat every year  Bone Density status: Completed 04/21/2020. Results reflect: Bone density results: OSTEOPENIA. Repeat every 5 years.  Lung Cancer Screening: (Low Dose CT Chest recommended if Age 22-80years, 30 pack-year currently smoking OR have quit w/in 15years.) does not qualify.   Lung Cancer Screening Referral: n/a  Additional Screening:  Hepatitis C Screening: does not qualify; Completed 01/17/2017  Vision Screening: Recommended annual ophthalmology exams for early detection of glaucoma and other disorders of the eye. Is the patient up to date with their annual eye exam?  Yes  Who is the provider or what is the name of the office in which the patient attends annual eye exams? [facility] If pt is not established  with a provider, would they like to be referred to a provider to establish care? No .   Dental  Screening: Recommended annual dental exams for proper oral hygiene  Community Resource Referral / Chronic Care Management: CRR required this visit?  No   CCM required this visit?  No      Plan:     I have personally reviewed and noted the following in the patient's chart:   Medical and social history Use of alcohol, tobacco or illicit drugs  Current medications and supplements including opioid prescriptions.  Functional ability and status Nutritional status Physical activity Advanced directives List of other physicians Hospitalizations, surgeries, and ER visits in previous 12 months Vitals Screenings to include cognitive, depression, and falls Referrals and appointments  In addition, I have reviewed and discussed with patient certain preventive protocols, quality metrics, and best practice recommendations. A written personalized care plan for preventive services as well as general preventive health recommendations were provided to patient.     Randel Pigg, LPN   [id number]   Nurse Notes: none

## 2020-09-06 NOTE — Patient Instructions (Signed)
Yolanda Green , Thank you for taking time to come for your Medicare Wellness Visit. I appreciate your ongoing commitment to your health goals. Please review the following plan we discussed and let me know if I can assist you in the future.   Screening recommendations/referrals: Colonoscopy: 01/14/2015  due 2027 Mammogram: Scheduled 09/20/2020 Bone Density: 04/21/2020 Recommended yearly ophthalmology/optometry visit for glaucoma screening and checkup Recommended yearly dental visit for hygiene and checkup  Vaccinations: Influenza vaccine: due in fall 2022  Pneumococcal vaccine: will consider  Tdap vaccine: 10/12/2016 Shingles vaccine: will consider     Advanced directives: will provide copies   Conditions/risks identified: none   Next appointment: none    Preventive Care 70 Years and Older, Female Preventive care refers to lifestyle choices and visits with your health care provider that can promote health and wellness. What does preventive care include? A yearly physical exam. This is also called an annual well check. Dental exams once or twice a year. Routine eye exams. Ask your health care provider how often you should have your eyes checked. Personal lifestyle choices, including: Daily care of your teeth and gums. Regular physical activity. Eating a healthy diet. Avoiding tobacco and drug use. Limiting alcohol use. Practicing safe sex. Taking low-dose aspirin every day. Taking vitamin and mineral supplements as recommended by your health care provider. What happens during an annual well check? The services and screenings done by your health care provider during your annual well check will depend on your age, overall health, lifestyle risk factors, and family history of disease. Counseling  Your health care provider may ask you questions about your: Alcohol use. Tobacco use. Drug use. Emotional well-being. Home and relationship well-being. Sexual activity. Eating  habits. History of falls. Memory and ability to understand (cognition). Work and work Statistician. Reproductive health. Screening  You may have the following tests or measurements: Height, weight, and BMI. Blood pressure. Lipid and cholesterol levels. These may be checked every 5 years, or more frequently if you are over 108 years old. Skin check. Lung cancer screening. You may have this screening every year starting at age 4 if you have a 30-pack-year history of smoking and currently smoke or have quit within the past 15 years. Fecal occult blood test (FOBT) of the stool. You may have this test every year starting at age 19. Flexible sigmoidoscopy or colonoscopy. You may have a sigmoidoscopy every 5 years or a colonoscopy every 10 years starting at age 66. Hepatitis C blood test. Hepatitis B blood test. Sexually transmitted disease (STD) testing. Diabetes screening. This is done by checking your blood sugar (glucose) after you have not eaten for a while (fasting). You may have this done every 1-3 years. Bone density scan. This is done to screen for osteoporosis. You may have this done starting at age 10. Mammogram. This may be done every 1-2 years. Talk to your health care provider about how often you should have regular mammograms. Talk with your health care provider about your test results, treatment options, and if necessary, the need for more tests. Vaccines  Your health care provider may recommend certain vaccines, such as: Influenza vaccine. This is recommended every year. Tetanus, diphtheria, and acellular pertussis (Tdap, Td) vaccine. You may need a Td booster every 10 years. Zoster vaccine. You may need this after age 63. Pneumococcal 13-valent conjugate (PCV13) vaccine. One dose is recommended after age 47. Pneumococcal polysaccharide (PPSV23) vaccine. One dose is recommended after age 8. Talk to your health care provider  about which screenings and vaccines you need and how  often you need them. This information is not intended to replace advice given to you by your health care provider. Make sure you discuss any questions you have with your health care provider. Document Released: 01/26/2015 Document Revised: 09/19/2015 Document Reviewed: 10/31/2014 Elsevier Interactive Patient Education  2017 [place] Prevention in the Home Falls can cause injuries. They can happen to people of all ages. There are many things you can do to make your home safe and to help prevent falls. What can I do on the outside of my home? Regularly fix the edges of walkways and driveways and fix any cracks. Remove anything that might make you trip as you walk through a door, such as a raised step or threshold. Trim any bushes or trees on the path to your home. Use bright outdoor lighting. Clear any walking paths of anything that might make someone trip, such as rocks or tools. Regularly check to see if handrails are loose or broken. Make sure that both sides of any steps have handrails. Any raised decks and porches should have guardrails on the edges. Have any leaves, snow, or ice cleared regularly. Use sand or salt on walking paths during winter. Clean up any spills in your garage right away. This includes oil or grease spills. What can I do in the bathroom? Use night lights. Install grab bars by the toilet and in the tub and shower. Do not use towel bars as grab bars. Use non-skid mats or decals in the tub or shower. If you need to sit down in the shower, use a plastic, non-slip stool. Keep the floor dry. Clean up any water that spills on the floor as soon as it happens. Remove soap buildup in the tub or shower regularly. Attach bath mats securely with double-sided non-slip rug tape. Do not have throw rugs and other things on the floor that can make you trip. What can I do in the bedroom? Use night lights. Make sure that you have a light by your bed that is easy to  reach. Do not use any sheets or blankets that are too big for your bed. They should not hang down onto the floor. Have a firm chair that has side arms. You can use this for support while you get dressed. Do not have throw rugs and other things on the floor that can make you trip. What can I do in the kitchen? Clean up any spills right away. Avoid walking on wet floors. Keep items that you use a lot in easy-to-reach places. If you need to reach something above you, use a strong step stool that has a grab bar. Keep electrical cords out of the way. Do not use floor polish or wax that makes floors slippery. If you must use wax, use non-skid floor wax. Do not have throw rugs and other things on the floor that can make you trip. What can I do with my stairs? Do not leave any items on the stairs. Make sure that there are handrails on both sides of the stairs and use them. Fix handrails that are broken or loose. Make sure that handrails are as long as the stairways. Check any carpeting to make sure that it is firmly attached to the stairs. Fix any carpet that is loose or worn. Avoid having throw rugs at the top or bottom of the stairs. If you do have throw rugs, attach them to the floor  with carpet tape. Make sure that you have a light switch at the top of the stairs and the bottom of the stairs. If you do not have them, ask someone to add them for you. What else can I do to help prevent falls? Wear shoes that: Do not have high heels. Have rubber bottoms. Are comfortable and fit you well. Are closed at the toe. Do not wear sandals. If you use a stepladder: Make sure that it is fully opened. Do not climb a closed stepladder. Make sure that both sides of the stepladder are locked into place. Ask someone to hold it for you, if possible. Clearly mark and make sure that you can see: Any grab bars or handrails. First and last steps. Where the edge of each step is. Use tools that help you move  around (mobility aids) if they are needed. These include: Canes. Walkers. Scooters. Crutches. Turn on the lights when you go into a dark area. Replace any light bulbs as soon as they burn out. Set up your furniture so you have a clear path. Avoid moving your furniture around. If any of your floors are uneven, fix them. If there are any pets around you, be aware of where they are. Review your medicines with your doctor. Some medicines can make you feel dizzy. This can increase your chance of falling. Ask your doctor what other things that you can do to help prevent falls. This information is not intended to replace advice given to you by your health care provider. Make sure you discuss any questions you have with your health care provider. Document Released: 10/26/2008 Document Revised: 06/07/2015 Document Reviewed: 02/03/2014 Elsevier Interactive Patient Education  2017 [employer].

## 2020-09-20 DIAGNOSIS — R922 Inconclusive mammogram: Secondary | ICD-10-CM | POA: Diagnosis not present

## 2020-09-20 DIAGNOSIS — Z9012 Acquired absence of left breast and nipple: Secondary | ICD-10-CM | POA: Diagnosis not present

## 2020-09-20 DIAGNOSIS — Z171 Estrogen receptor negative status [ER-]: Secondary | ICD-10-CM | POA: Diagnosis not present

## 2020-09-20 DIAGNOSIS — C50812 Malignant neoplasm of overlapping sites of left female breast: Secondary | ICD-10-CM | POA: Diagnosis not present

## 2020-10-24 DIAGNOSIS — Z23 Encounter for immunization: Secondary | ICD-10-CM | POA: Diagnosis not present

## 2020-11-12 ENCOUNTER — Ambulatory Visit: Payer: Medicare Other | Admitting: Family Medicine

## 2020-11-19 ENCOUNTER — Ambulatory Visit: Payer: Medicare Other | Admitting: Family Medicine

## 2020-11-20 ENCOUNTER — Other Ambulatory Visit: Payer: Self-pay

## 2020-11-21 ENCOUNTER — Ambulatory Visit (INDEPENDENT_AMBULATORY_CARE_PROVIDER_SITE_OTHER): Payer: Medicare Other | Admitting: Family Medicine

## 2020-11-21 ENCOUNTER — Encounter: Payer: Self-pay | Admitting: Family Medicine

## 2020-11-21 VITALS — BP 124/80 | HR 90 | Temp 98.5°F | Wt 175.4 lb

## 2020-11-21 DIAGNOSIS — G629 Polyneuropathy, unspecified: Secondary | ICD-10-CM | POA: Diagnosis not present

## 2020-11-21 DIAGNOSIS — R1010 Upper abdominal pain, unspecified: Secondary | ICD-10-CM | POA: Diagnosis not present

## 2020-11-21 NOTE — Progress Notes (Signed)
   Subjective:    Patient ID: Yolanda Green, female    DOB: 1945/02/24, 75 y.o.   MRN: [id number]  HPI Here to follow up GI concerns and neuropathy. We spoke about these issues in July, and she described a tightness or pressure that builds up in the upper abdomen. She has been taking Omeprazole and Pepcid, but these do not help. She has no nausea. She says she feels good when she gets up in the mornings, but as she starts to eat food, the pressure builds up throughout the day. Her BMs are regular. No trouble swallowing. She has had upper and lower endoscopy, abdominal US and CT scans in the past year which have been normal. She sees Dr. Egbert Garibaldi of [facility] several times a year. We also discussed neuropathy in the form of tingling and numbness in both feet and legs. We did labs which were normal including an A1c and B12 level. She notes that the upper abdominal issues and the neuropathy both started at the same time last year after she received chemotherapy.    Review of Systems  Constitutional: Negative.   Respiratory: Negative.    Cardiovascular: Negative.   Gastrointestinal:  Positive for abdominal distention and abdominal pain. Negative for anal bleeding, blood in stool, constipation, diarrhea, nausea, rectal pain and vomiting.  Neurological:  Positive for numbness.      Objective:   Physical Exam Constitutional:      Appearance: Normal appearance.  Cardiovascular:     Rate and Rhythm: Normal rate and regular rhythm.     Pulses: Normal pulses.     Heart sounds: Normal heart sounds.  Pulmonary:     Effort: Pulmonary effort is normal.     Breath sounds: Normal breath sounds.  Abdominal:     General: Abdomen is flat. Bowel sounds are normal. There is no distension.     Palpations: Abdomen is soft. There is no mass.     Tenderness: There is no abdominal tenderness. There is no guarding or rebound.     Hernia: No hernia is present.  Neurological:     Mental Status: She is alert.           Assessment & Plan:  Since the abdominal issues and the neuropathy both appeared when she got chemotherapy, it is likely that these were both caused by it. I believe she has a problem with delayed gastric emptying, which can be a form of neuropathy. She will follow up with [facility] for this. We agreed to monitor the lower extremity neuropathy for now.  Alysia Penna, MD

## 2020-12-12 DIAGNOSIS — Z853 Personal history of malignant neoplasm of breast: Secondary | ICD-10-CM | POA: Diagnosis not present

## 2020-12-12 DIAGNOSIS — C50812 Malignant neoplasm of overlapping sites of left female breast: Secondary | ICD-10-CM | POA: Diagnosis not present

## 2020-12-12 DIAGNOSIS — Z171 Estrogen receptor negative status [ER-]: Secondary | ICD-10-CM | POA: Diagnosis not present

## 2020-12-13 DIAGNOSIS — Z171 Estrogen receptor negative status [ER-]: Secondary | ICD-10-CM | POA: Diagnosis not present

## 2020-12-13 DIAGNOSIS — C50812 Malignant neoplasm of overlapping sites of left female breast: Secondary | ICD-10-CM | POA: Diagnosis not present

## 2021-01-29 DIAGNOSIS — R1013 Epigastric pain: Secondary | ICD-10-CM | POA: Diagnosis not present

## 2021-03-09 IMAGING — MG MM DIGITAL SCREENING BILAT W/ TOMO W/ CAD
8 series · 8 of 24 positions shown · non-contrast
Comparison: Previous exam(s).
COMPARISON: Previous exam(s).

Addendum:
CLINICAL DATA: Screening.

EXAM:
DIGITAL SCREENING BILATERAL MAMMOGRAM WITH TOMO AND CAD

[R MLO synth-2D]
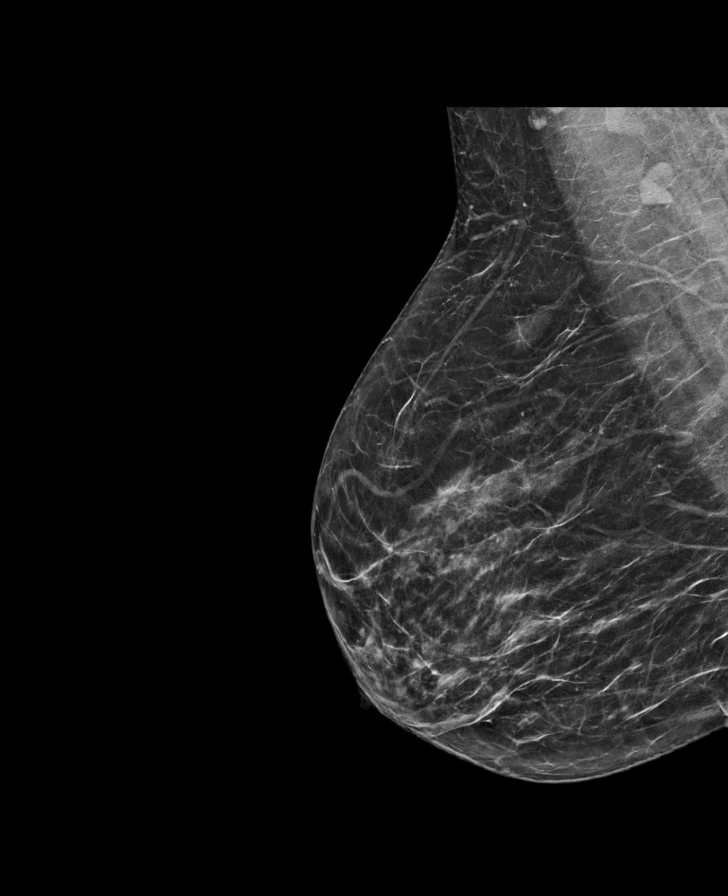

[L CC synth-2D]
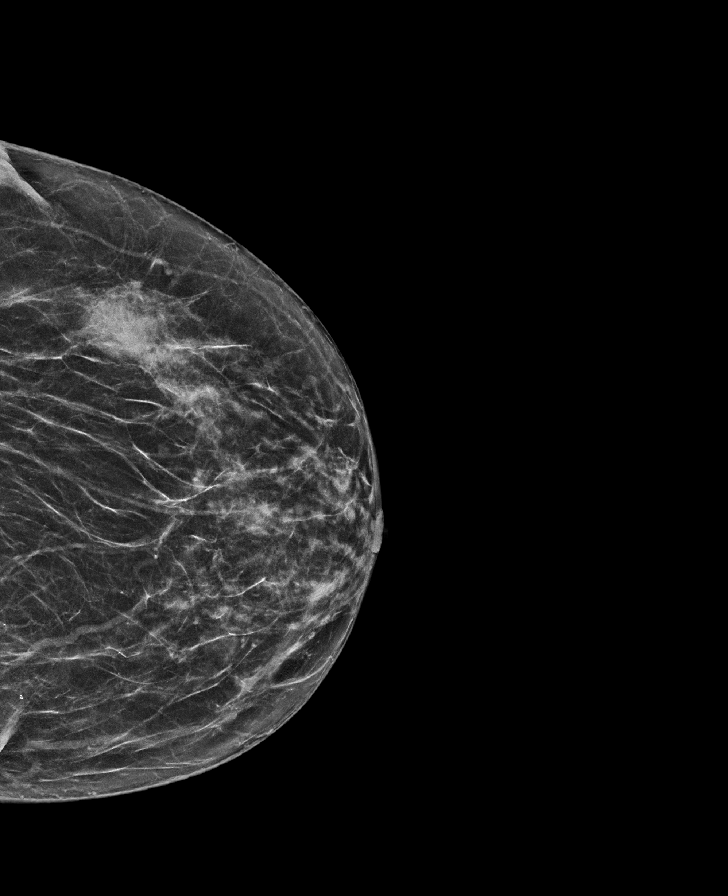

[L MLO synth-2D]
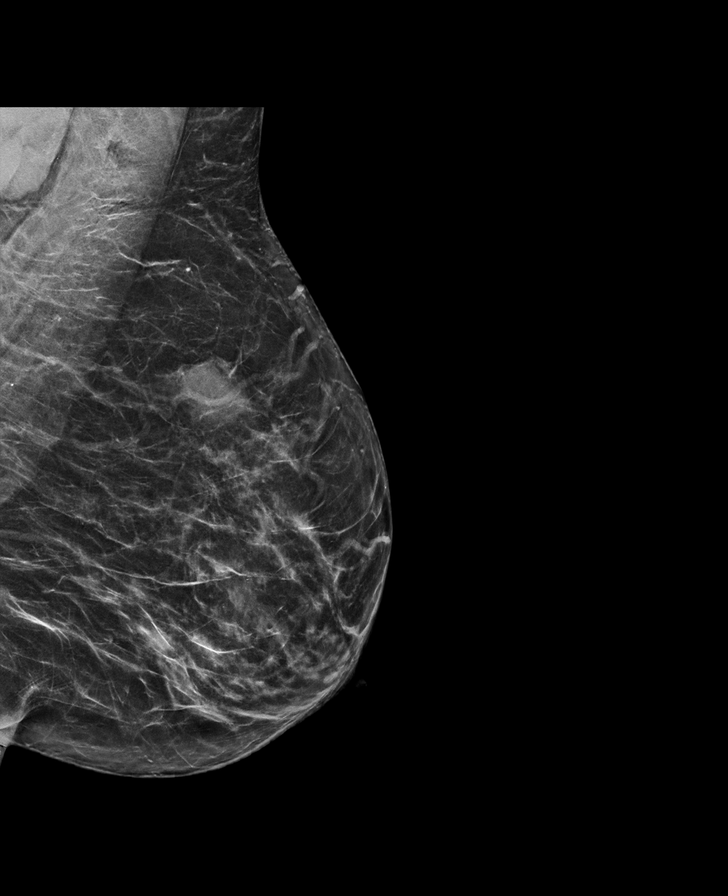

[R CC synth-2D]
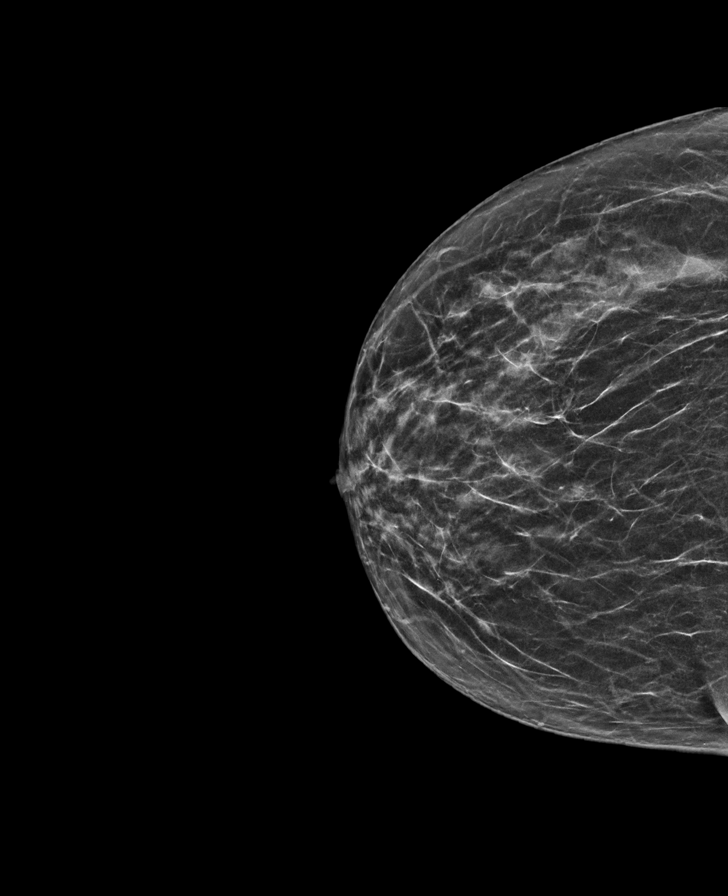

[L MLO tomo · tomo slice 35/68.0]
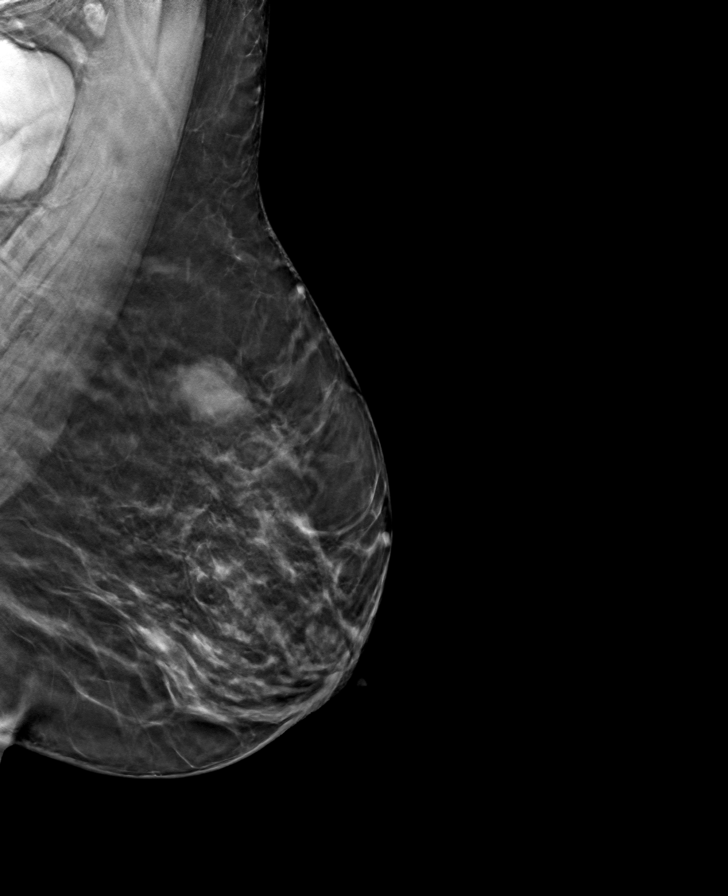

[L CC tomo · tomo slice 29/58.0]
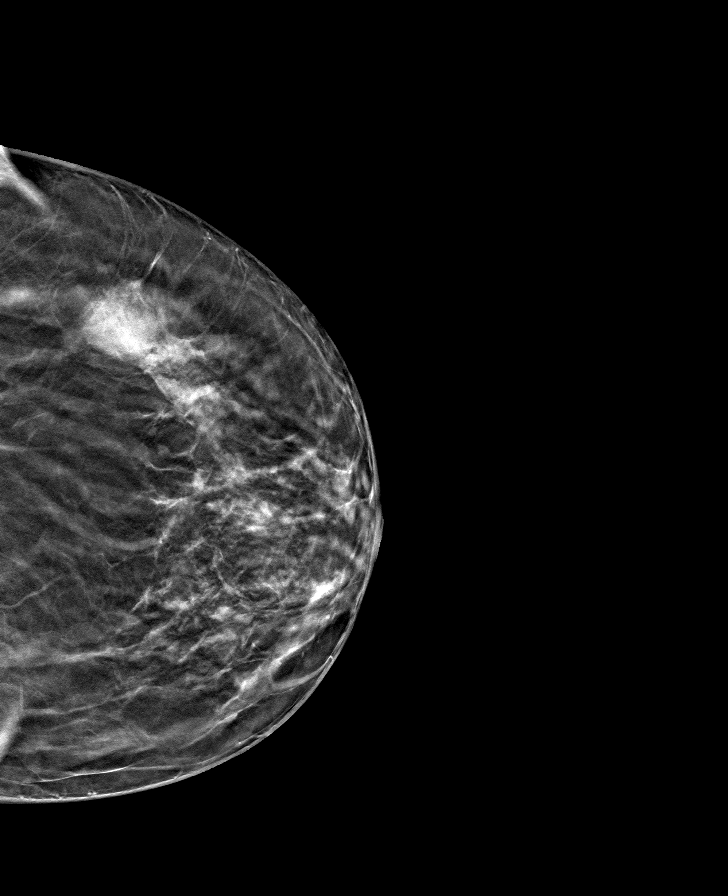

[R CC tomo · tomo slice 29/56.0]
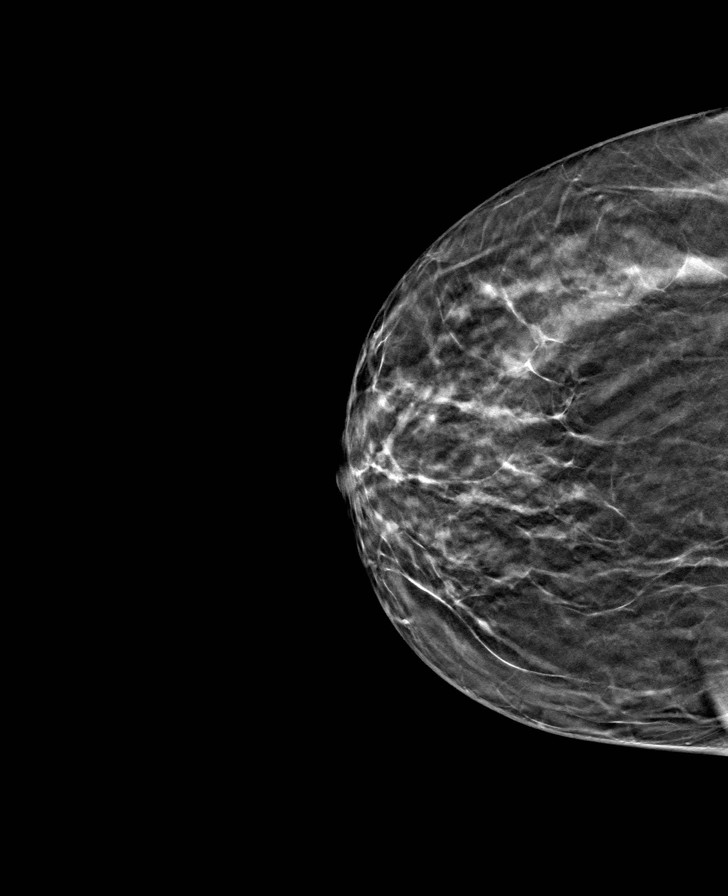

[R MLO tomo · tomo slice 33/65.0]
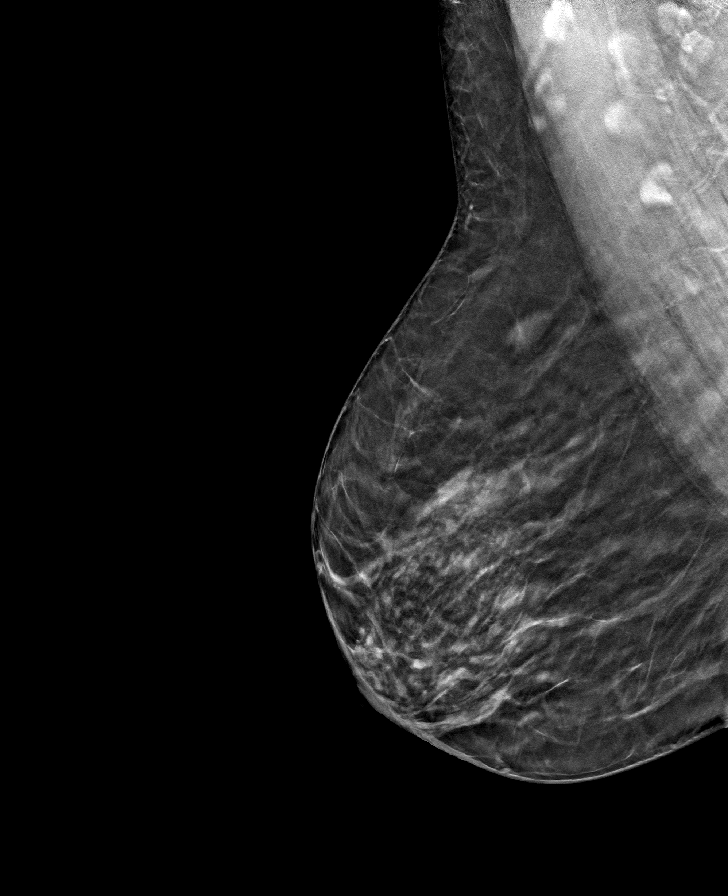

[8 of 24 positions shown; findings below may reference images not displayed]

ACR Breast Density Category b: There are scattered areas of
fibroglandular density.
FINDINGS: In the right breast, a possible mass as well as abnormal right
axillary lymph nodes warrant further evaluation. In the left breast,
no findings suspicious for malignancy. Images were processed with
CAD.
IMPRESSION: Further evaluation is suggested for possible mass in the right
breast and abnormal right axillary lymph node/nodes.

RECOMMENDATION:
Diagnostic mammogram and possibly ultrasound of the right breast.
(Code:N5-5-CCK)

The patient will be contacted regarding the findings, and additional
imaging will be scheduled.

BI-RADS CATEGORY  0: Incomplete. Need additional imaging evaluation
and/or prior mammograms for comparison.

ADDENDUM:
The original report contains laterality errors. The abnormality is
noted in the left breast and left axilla. There is no abnormality in
the right breast. Follow-up diagnostic imaging should be of the left
breast and left axilla.

*** End of Addendum ***
ACR Breast Density Category b: There are scattered areas of
fibroglandular density.
FINDINGS: In the right breast, a possible mass as well as abnormal right
axillary lymph nodes warrant further evaluation. In the left breast,
no findings suspicious for malignancy. Images were processed with
CAD.
IMPRESSION: Further evaluation is suggested for possible mass in the right
breast and abnormal right axillary lymph node/nodes.

RECOMMENDATION:
Diagnostic mammogram and possibly ultrasound of the right breast.
(Code:N5-5-CCK)

The patient will be contacted regarding the findings, and additional
imaging will be scheduled.

BI-RADS CATEGORY  0: Incomplete. Need additional imaging evaluation
and/or prior mammograms for comparison.

## 2021-03-15 DIAGNOSIS — R1013 Epigastric pain: Secondary | ICD-10-CM | POA: Diagnosis not present

## 2021-03-28 DIAGNOSIS — Z171 Estrogen receptor negative status [ER-]: Secondary | ICD-10-CM | POA: Diagnosis not present

## 2021-03-28 DIAGNOSIS — C50812 Malignant neoplasm of overlapping sites of left female breast: Secondary | ICD-10-CM | POA: Diagnosis not present

## 2021-04-11 DIAGNOSIS — C50812 Malignant neoplasm of overlapping sites of left female breast: Secondary | ICD-10-CM | POA: Diagnosis not present

## 2021-04-11 DIAGNOSIS — Z171 Estrogen receptor negative status [ER-]: Secondary | ICD-10-CM | POA: Diagnosis not present

## 2021-04-11 DIAGNOSIS — Z9012 Acquired absence of left breast and nipple: Secondary | ICD-10-CM | POA: Diagnosis not present

## 2021-04-11 DIAGNOSIS — N6459 Other signs and symptoms in breast: Secondary | ICD-10-CM | POA: Diagnosis not present

## 2021-04-11 DIAGNOSIS — L986 Other infiltrative disorders of the skin and subcutaneous tissue: Secondary | ICD-10-CM | POA: Diagnosis not present

## 2021-04-11 DIAGNOSIS — R234 Changes in skin texture: Secondary | ICD-10-CM | POA: Diagnosis not present

## 2021-07-12 ENCOUNTER — Ambulatory Visit (INDEPENDENT_AMBULATORY_CARE_PROVIDER_SITE_OTHER): Payer: Medicare Other | Admitting: Adult Health

## 2021-07-12 ENCOUNTER — Encounter: Payer: Self-pay | Admitting: Adult Health

## 2021-07-12 ENCOUNTER — Telehealth: Payer: Self-pay | Admitting: Family Medicine

## 2021-07-12 ENCOUNTER — Encounter: Payer: Self-pay | Admitting: Hematology and Oncology

## 2021-07-12 VITALS — BP 120/80 | HR 75 | Temp 98.3°F | Ht 65.0 in | Wt 171.0 lb

## 2021-07-12 DIAGNOSIS — R739 Hyperglycemia, unspecified: Secondary | ICD-10-CM

## 2021-07-12 DIAGNOSIS — H6123 Impacted cerumen, bilateral: Secondary | ICD-10-CM | POA: Diagnosis not present

## 2021-07-12 DIAGNOSIS — G629 Polyneuropathy, unspecified: Secondary | ICD-10-CM | POA: Diagnosis not present

## 2021-07-12 LAB — BASIC METABOLIC PANEL
BUN: 15 mg/dL (ref 6–23)
CO2: 28 mEq/L (ref 19–32)
Calcium: 9.9 mg/dL (ref 8.4–10.5)
Chloride: 105 mEq/L (ref 96–112)
Creatinine, Ser: 1.19 mg/dL (ref 0.40–1.20)
GFR: 45.27 mL/min — ABNORMAL LOW (ref >60.00)
Glucose, Bld: 95 mg/dL (ref 70–99)
Potassium: 5 mEq/L (ref 3.5–5.1)
Sodium: 139 mEq/L (ref 135–145)

## 2021-07-12 LAB — HEMOGLOBIN A1C: Hgb A1c MFr Bld: 6 % (ref 4.6–6.5)

## 2021-07-12 NOTE — Telephone Encounter (Signed)
Pt is calling and would like her blood work results

## 2021-07-12 NOTE — Progress Notes (Signed)
Subjective:    Patient ID: Yolanda Green, female    DOB: 12/31/1945, 76 y.o.   MRN: [id number]  HPI 76 year old female who  has a past medical history of Cancer ([place]), Family history of breast cancer, Family history of prostate cancer, Family history of stomach cancer, Fibroids, H/O varicella, History of measles, mumps, or rubella, Hypertension, PMB (postmenopausal bleeding) (2005), Stenotic cervical os (07/08/03), Stress (06/20/09), Trichomonas (05/08/03), and Yeast infection.  She is a patient of Dr. Sarajane Jews who I am seeing today. She has multiple issues she would like to discuss today   Her first issue is that of neuropathy and concern for elevated blood sugars. 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 Neuropathy is in hands and feet. Has been worse over the last 2-3 months.   Additionally, she reports that she believes her ears are clogged up with wax as she has noticed decreased hearing.   Review of Systems See HPI   Past Medical History:  Diagnosis Date   Cancer [facility])    Family history of breast cancer    Family history of prostate cancer    Family history of stomach cancer    Fibroids    H/O varicella    History of measles, mumps, or rubella    Hypertension    PMB (postmenopausal bleeding) 2005   Stenotic cervical os 07/08/03   Stress 06/20/09   Trichomonas 05/08/03   Yeast infection     Social History   Socioeconomic History   Marital status: Widowed    Spouse name: Not on file   Number of children: 2   Years of education: Not on file   Highest education level: Not on file  Occupational History   Not on file  Tobacco Use   Smoking status: Never   Smokeless tobacco: Never  Vaping Use   Vaping Use: Never used  Substance and Sexual Activity   Alcohol use: Yes    Comment: occasional glass of wine   Drug use: No    Sexual activity: Not Currently    Birth control/protection: Surgical, Post-menopausal    Comment: BTL  Other Topics Concern   Not on file  Social History Narrative   Not on file   Social Determinants of Health   Financial Resource Strain: Low Risk  (09/06/2020)   Overall Financial Resource Strain (CARDIA)    Difficulty of Paying Living Expenses: Not hard at all  Food Insecurity: No Food Insecurity (09/06/2020)   Hunger Vital Sign    Worried About Running Out of Food in the Last Year: Never true    [place] in the Last Year: Never true  Transportation Needs: No Transportation Needs (09/06/2020)   PRAPARE - Hydrologist (Medical): No    Lack of Transportation (Non-Medical): No  Physical Activity: Sufficiently Active (09/06/2020)   Exercise Vital Sign    Days of Exercise per Week: 5 days    Minutes of Exercise per Session: 40 min  Stress: No Stress Concern Present (09/06/2020)   [place]    Feeling of Stress : Not at all  Social Connections: Moderately Isolated (09/06/2020)   Social Connection and Isolation Panel [NHANES]    Frequency of Communication with Friends and Family: Three times a  week    Frequency of Social Gatherings with Friends and Family: Three times a week    Attends Religious Services: More than 4 times per year    Active Member of Clubs or Organizations: No    Attends Archivist Meetings: Never    Marital Status: Widowed  Intimate Partner Violence: Not At Risk (09/06/2020)   Humiliation, Afraid, Rape, and Kick questionnaire    Fear of Current or Ex-Partner: No    Emotionally Abused: No    Physically Abused: No    Sexually Abused: No    Past Surgical History:  Procedure Laterality Date   BREAST SURGERY     COLONOSCOPY N/A 09/30/2019   per Dr. Dorris Singh of [facility], precancerous polyps, repeat in 5 yrs   DILATION AND CURETTAGE OF UTERUS  07/08/2003    ESOPHAGOGASTRODUODENOSCOPY N/A 09/30/2019   per Dr. Dorris Singh of [facility], esophagitis, benign gastric polyps, neg H pylori   HYSTEROSCOPY  07/08/2003   INGUINAL HERNIA REPAIR  1996   TUBAL LIGATION  1979   WISDOM TOOTH EXTRACTION      Family History  Problem Relation Age of Onset   Prostate cancer Father 65       not metastatic   Diabetes Sister    Hearing loss Son    Breast cancer Paternal Aunt 28       1 aunt with breast cancer   Depression Maternal Grandmother    Stomach cancer Paternal Grandfather 16    No Known Allergies  Current Outpatient Medications on File Prior to Visit  Medication Sig Dispense Refill   amLODipine (NORVASC) 10 MG tablet Take 1 tablet (10 mg total) by mouth daily. 90 tablet 3   atorvastatin (LIPITOR) 40 MG tablet Take 1 tablet (40 mg total) by mouth daily. 90 tablet 3   No current facility-administered medications on file prior to visit.    There were no vitals taken for this visit.      Objective:   Physical Exam Vitals and nursing note reviewed.  Constitutional:      Appearance: Normal appearance.  HENT:     Right Ear: There is impacted cerumen.     Left Ear: There is impacted cerumen.  Cardiovascular:     Rate and Rhythm: Normal rate and regular rhythm.     Pulses: Normal pulses.     Heart sounds: Normal heart sounds.  Pulmonary:     Effort: Pulmonary effort is normal.     Breath sounds: Normal breath sounds.  Musculoskeletal:        General: Normal range of motion.  Skin:    General: Skin is warm and dry.     Capillary Refill: Capillary refill takes less than 2 seconds.  Neurological:     General: No focal deficit present.     Mental Status: She is alert and oriented to person, place, and time.  Psychiatric:        Mood and Affect: Mood normal.        Behavior: Behavior normal.        Thought Content: Thought content normal.        Judgment: Judgment normal.       Assessment & Plan:  1. Neuropathy - Hemoglobin A1c;  Future - Basic Metabolic Panel; Future  2. Hyperglycemia  - Hemoglobin A1c; Future - Basic Metabolic Panel; Future  3. Bilateral impacted cerumen Verbal Consent obtained Warm water was applied and gentle ear lavage performed to  bilateral ears. There  were no complications and following the disimpaction the tympanic membrane were visible bilaterally. Tympanic membranes are intact following the procedure.  Auditory canals are normal.  The patient reported relief of symptoms after removal of cerumen. Patient tolerated procedure well.    Dorothyann Peng, NP

## 2021-07-15 ENCOUNTER — Ambulatory Visit: Payer: Medicare Other | Admitting: Family Medicine

## 2021-07-15 NOTE — Telephone Encounter (Signed)
Reviewed lab results with pt verbalized understanding 

## 2021-07-17 ENCOUNTER — Ambulatory Visit: Payer: Medicare Other | Admitting: Family Medicine

## 2021-08-02 DIAGNOSIS — Z03818 Encounter for observation for suspected exposure to other biological agents ruled out: Secondary | ICD-10-CM | POA: Diagnosis not present

## 2021-08-02 DIAGNOSIS — J069 Acute upper respiratory infection, unspecified: Secondary | ICD-10-CM | POA: Diagnosis not present

## 2021-08-02 DIAGNOSIS — Z6827 Body mass index (BMI) 27.0-27.9, adult: Secondary | ICD-10-CM | POA: Diagnosis not present

## 2021-08-02 DIAGNOSIS — J029 Acute pharyngitis, unspecified: Secondary | ICD-10-CM | POA: Diagnosis not present

## 2021-08-05 ENCOUNTER — Other Ambulatory Visit: Payer: Self-pay

## 2021-08-08 ENCOUNTER — Other Ambulatory Visit: Payer: Self-pay | Admitting: Family Medicine

## 2021-08-08 DIAGNOSIS — I10 Essential (primary) hypertension: Secondary | ICD-10-CM

## 2021-08-08 DIAGNOSIS — E785 Hyperlipidemia, unspecified: Secondary | ICD-10-CM

## 2021-08-09 ENCOUNTER — Other Ambulatory Visit: Payer: Self-pay

## 2021-09-03 ENCOUNTER — Telehealth: Payer: Self-pay | Admitting: Family Medicine

## 2021-09-03 NOTE — Telephone Encounter (Signed)
Left message for patient to call back and schedule Medicare Annual Wellness Visit (AWV) either virtually or in office. Left  my jabber number [phone]   Last AWV 09/06/20 ; please schedule at anytime with [facility] Nurse Health Advisor 1 or 2    

## 2021-09-04 ENCOUNTER — Other Ambulatory Visit: Payer: Self-pay

## 2021-09-06 ENCOUNTER — Telehealth: Payer: Self-pay | Admitting: Family Medicine

## 2021-09-06 NOTE — Telephone Encounter (Signed)
Pt called to say she does not want a virtual or phone visit for the Medicare AWV on Monday - 09/09/21.  Pt states she would rather come in person. Is Pt able to be seen in person on Monday?  If not, Pt is asking to reschedule appointment.  Please call Pt back to discuss.  [phone]

## 2021-09-09 ENCOUNTER — Ambulatory Visit (INDEPENDENT_AMBULATORY_CARE_PROVIDER_SITE_OTHER): Payer: Medicare Other

## 2021-09-09 VITALS — BP 120/62 | HR 65 | Temp 97.8°F | Ht 65.0 in | Wt 175.1 lb

## 2021-09-09 DIAGNOSIS — Z0000 Encounter for general adult medical examination without abnormal findings: Secondary | ICD-10-CM

## 2021-09-09 NOTE — Telephone Encounter (Signed)
Pt is coming into office

## 2021-09-09 NOTE — Patient Instructions (Addendum)
Yolanda Green , Thank you for taking time to come for your Medicare Wellness Visit. I appreciate your ongoing commitment to your health goals. Please review the following plan we discussed and let me know if I can assist you in the future.   Screening recommendations/referrals: Colonoscopy: Done 01/14/15 repeat 10 yrs Mammogram: Patient deferred Bone Density: Done 04/21/20 Recommended yearly ophthalmology/optometry visit for glaucoma screening and checkup Recommended yearly dental visit for hygiene and checkup  Vaccinations: Influenza vaccine: Declined Pneumococcal vaccine: Declined Tdap vaccine: Up to date Shingles vaccine: Declined   Covid-19: Up to date  Advanced directives: Yes  Conditions/risks identified: None  Next appointment: Follow up in one year for your annual wellness visit    Preventive Care 44 Years and Older, Female Preventive care refers to lifestyle choices and visits with your health care provider that can promote health and wellness. What does preventive care include? A yearly physical exam. This is also called an annual well check. Dental exams once or twice a year. Routine eye exams. Ask your health care provider how often you should have your eyes checked. Personal lifestyle choices, including: Daily care of your teeth and gums. Regular physical activity. Eating a healthy diet. Avoiding tobacco and drug use. Limiting alcohol use. Practicing safe sex. Taking low-dose aspirin every day. Taking vitamin and mineral supplements as recommended by your health care provider. What happens during an annual well check? The services and screenings done by your health care provider during your annual well check will depend on your age, overall health, lifestyle risk factors, and family history of disease. Counseling  Your health care provider may ask you questions about your: Alcohol use. Tobacco use. Drug use. Emotional well-being. Home and relationship  well-being. Sexual activity. Eating habits. History of falls. Memory and ability to understand (cognition). Work and work Statistician. Reproductive health. Screening  You may have the following tests or measurements: Height, weight, and BMI. Blood pressure. Lipid and cholesterol levels. These may be checked every 5 years, or more frequently if you are over 81 years old. Skin check. Lung cancer screening. You may have this screening every year starting at age 49 if you have a 30-pack-year history of smoking and currently smoke or have quit within the past 15 years. Fecal occult blood test (FOBT) of the stool. You may have this test every year starting at age 8. Flexible sigmoidoscopy or colonoscopy. You may have a sigmoidoscopy every 5 years or a colonoscopy every 10 years starting at age 60. Hepatitis C blood test. Hepatitis B blood test. Sexually transmitted disease (STD) testing. Diabetes screening. This is done by checking your blood sugar (glucose) after you have not eaten for a while (fasting). You may have this done every 1-3 years. Bone density scan. This is done to screen for osteoporosis. You may have this done starting at age 45. Mammogram. This may be done every 1-2 years. Talk to your health care provider about how often you should have regular mammograms. Talk with your health care provider about your test results, treatment options, and if necessary, the need for more tests. Vaccines  Your health care provider may recommend certain vaccines, such as: Influenza vaccine. This is recommended every year. Tetanus, diphtheria, and acellular pertussis (Tdap, Td) vaccine. You may need a Td booster every 10 years. Zoster vaccine. You may need this after age 33. Pneumococcal 13-valent conjugate (PCV13) vaccine. One dose is recommended after age 84. Pneumococcal polysaccharide (PPSV23) vaccine. One dose is recommended after age 57. Talk  to your health care provider about which  screenings and vaccines you need and how often you need them. This information is not intended to replace advice given to you by your health care provider. Make sure you discuss any questions you have with your health care provider. Document Released: 01/26/2015 Document Revised: 09/19/2015 Document Reviewed: 10/31/2014 Elsevier Interactive Patient Education  2017 [place] Prevention in the Home Falls can cause injuries. They can happen to people of all ages. There are many things you can do to make your home safe and to help prevent falls. What can I do on the outside of my home? Regularly fix the edges of walkways and driveways and fix any cracks. Remove anything that might make you trip as you walk through a door, such as a raised step or threshold. Trim any bushes or trees on the path to your home. Use bright outdoor lighting. Clear any walking paths of anything that might make someone trip, such as rocks or tools. Regularly check to see if handrails are loose or broken. Make sure that both sides of any steps have handrails. Any raised decks and porches should have guardrails on the edges. Have any leaves, snow, or ice cleared regularly. Use sand or salt on walking paths during winter. Clean up any spills in your garage right away. This includes oil or grease spills. What can I do in the bathroom? Use night lights. Install grab bars by the toilet and in the tub and shower. Do not use towel bars as grab bars. Use non-skid mats or decals in the tub or shower. If you need to sit down in the shower, use a plastic, non-slip stool. Keep the floor dry. Clean up any water that spills on the floor as soon as it happens. Remove soap buildup in the tub or shower regularly. Attach bath mats securely with double-sided non-slip rug tape. Do not have throw rugs and other things on the floor that can make you trip. What can I do in the bedroom? Use night lights. Make sure that you have a  light by your bed that is easy to reach. Do not use any sheets or blankets that are too big for your bed. They should not hang down onto the floor. Have a firm chair that has side arms. You can use this for support while you get dressed. Do not have throw rugs and other things on the floor that can make you trip. What can I do in the kitchen? Clean up any spills right away. Avoid walking on wet floors. Keep items that you use a lot in easy-to-reach places. If you need to reach something above you, use a strong step stool that has a grab bar. Keep electrical cords out of the way. Do not use floor polish or wax that makes floors slippery. If you must use wax, use non-skid floor wax. Do not have throw rugs and other things on the floor that can make you trip. What can I do with my stairs? Do not leave any items on the stairs. Make sure that there are handrails on both sides of the stairs and use them. Fix handrails that are broken or loose. Make sure that handrails are as long as the stairways. Check any carpeting to make sure that it is firmly attached to the stairs. Fix any carpet that is loose or worn. Avoid having throw rugs at the top or bottom of the stairs. If you do have throw rugs,  attach them to the floor with carpet tape. Make sure that you have a light switch at the top of the stairs and the bottom of the stairs. If you do not have them, ask someone to add them for you. What else can I do to help prevent falls? Wear shoes that: Do not have high heels. Have rubber bottoms. Are comfortable and fit you well. Are closed at the toe. Do not wear sandals. If you use a stepladder: Make sure that it is fully opened. Do not climb a closed stepladder. Make sure that both sides of the stepladder are locked into place. Ask someone to hold it for you, if possible. Clearly mark and make sure that you can see: Any grab bars or handrails. First and last steps. Where the edge of each step  is. Use tools that help you move around (mobility aids) if they are needed. These include: Canes. Walkers. Scooters. Crutches. Turn on the lights when you go into a dark area. Replace any light bulbs as soon as they burn out. Set up your furniture so you have a clear path. Avoid moving your furniture around. If any of your floors are uneven, fix them. If there are any pets around you, be aware of where they are. Review your medicines with your doctor. Some medicines can make you feel dizzy. This can increase your chance of falling. Ask your doctor what other things that you can do to help prevent falls. This information is not intended to replace advice given to you by your health care provider. Make sure you discuss any questions you have with your health care provider. Document Released: 10/26/2008 Document Revised: 06/07/2015 Document Reviewed: 02/03/2014 Elsevier Interactive Patient Education  2017 [employer].

## 2021-09-09 NOTE — Progress Notes (Signed)
Subjective:   Yolanda Green is a 76 y.o. female who presents for Medicare Annual (Subsequent) preventive examination.  Review of Systems     Cardiac Risk Factors include: advanced age (>16mn, >>52women);hypertension     Objective:    Today's Vitals   09/09/21 0859  BP: 120/62  Pulse: 65  Temp: 97.8 F (36.6 C)  TempSrc: Oral  SpO2: 98%  Weight: 175 lb 1.6 oz (79.4 kg)  Height: '5\' 5"'$  (1.651 m)   Body mass index is 29.14 kg/m.     09/09/2021    9:16 AM 09/06/2020    9:07 AM 09/06/2019    9:12 AM 10/13/2018   11:31 AM 11/16/2017    6:02 PM 04/18/2016    2:01 PM  Advanced Directives  Does Patient Have a Medical Advance Directive? Yes Yes Yes No No No  Type of AParamedicof AP[place]Living will HA[place]Living will H[place]Living will     Does patient want to make changes to medical advance directive? No - Patient declined       Copy of [place]? No - copy requested No - copy requested Yes - validated most recent copy scanned in chart (See row information)     Would patient like information on creating a medical advance directive?    No - Patient declined  No - Patient declined    Current Medications (verified) Outpatient Encounter Medications as of 09/09/2021  Medication Sig   amLODipine (NORVASC) 10 MG tablet TAKE 1 TABLET BY MOUTH EVERY DAY   atorvastatin (LIPITOR) 40 MG tablet TAKE 1 TABLET BY MOUTH EVERY DAY   No facility-administered encounter medications on file as of 09/09/2021.    Allergies (verified) Patient has no known allergies.   History: Past Medical History:  Diagnosis Date   Cancer ([place]    Family history of breast cancer    Family history of prostate cancer    Family history of stomach cancer    Fibroids    H/O varicella    History of measles, mumps, or rubella    Hypertension    PMB (postmenopausal bleeding) 2005   Stenotic cervical os  07/08/03   Stress 06/20/09   Trichomonas 05/08/03   Yeast infection    Past Surgical History:  Procedure Laterality Date   BREAST SURGERY     COLONOSCOPY N/A 09/30/2019   per Dr. DDorris Singhof [facility], precancerous polyps, repeat in 5 yrs   DILATION AND CURETTAGE OF UTERUS  07/08/2003   ESOPHAGOGASTRODUODENOSCOPY N/A 09/30/2019   per Dr. DDorris Singhof [facility], esophagitis, benign gastric polyps, neg H pylori   HYSTEROSCOPY  07/08/2003   INGUINAL HERNIA REPAIR  1996   TUBAL LIGATION  1979   WISDOM TOOTH EXTRACTION     Family History  Problem Relation Age of Onset   Prostate cancer Father 772      not metastatic   Diabetes Sister    Hearing loss Son    Breast cancer Paternal Aunt 869      1 aunt with breast cancer   Depression Maternal Grandmother    Stomach cancer Paternal Grandfather 74   Social History   Socioeconomic History   Marital status: Widowed    Spouse name: Not on file   Number of children: 2   Years of education: Not on file   Highest education level: Not on file  Occupational History   Not  on file  Tobacco Use   Smoking status: Never   Smokeless tobacco: Never  Vaping Use   Vaping Use: Never used  Substance and Sexual Activity   Alcohol use: Yes    Comment: occasional glass of wine   Drug use: No   Sexual activity: Not Currently    Birth control/protection: Surgical, Post-menopausal    Comment: BTL  Other Topics Concern   Not on file  Social History Narrative   Not on file   Social Determinants of Health   Financial Resource Strain: Low Risk  (09/09/2021)   Overall Financial Resource Strain (CARDIA)    Difficulty of Paying Living Expenses: Not hard at all  Food Insecurity: No Food Insecurity (09/09/2021)   Hunger Vital Sign    Worried About Running Out of Food in the Last Year: Never true    Ran Out of Food in the Last Year: Never true  Transportation Needs: No Transportation Needs (09/09/2021)   PRAPARE - Radiographer, therapeutic (Medical): No    Lack of Transportation (Non-Medical): No  Physical Activity: Sufficiently Active (09/09/2021)   Exercise Vital Sign    Days of Exercise per Week: 6 days    Minutes of Exercise per Session: 40 min  Stress: No Stress Concern Present (09/09/2021)   [place]    Feeling of Stress : Not at all  Social Connections: Moderately Integrated (09/09/2021)   Social Connection and Isolation Panel [NHANES]    Frequency of Communication with Friends and Family: More than three times a week    Frequency of Social Gatherings with Friends and Family: More than three times a week    Attends Religious Services: More than 4 times per year    Active Member of [employer] or Organizations: Yes    Attends Archivist Meetings: More than 4 times per year    Marital Status: Widowed    Tobacco Counseling Counseling given: Not Answered   Clinical Intake:  Pre-visit preparation completed: No  Pain : No/denies pain     BMI - recorded: 28.46 Nutritional Status: BMI 25 -29 Overweight Nutritional Risks: None Diabetes: No  How often do you need to have someone help you when you read instructions, pamphlets, or other written materials from your doctor or pharmacy?: 1 - Never  Diabetic?  No  Interpreter Needed?: No  Information entered by :: Rolene Arbour LPN   Activities of Daily Living    09/09/2021    9:12 AM  In your present state of health, do you have any difficulty performing the following activities:  Hearing? 0  Vision? 0  Difficulty concentrating or making decisions? 0  Walking or climbing stairs? 0  Dressing or bathing? 0  Doing errands, shopping? 0  Preparing Food and eating ? N  Using the Toilet? N  In the past six months, have you accidently leaked urine? N  Do you have problems with loss of bowel control? N  Managing your Medications? N  Managing your Finances? N  Housekeeping or  managing your Housekeeping? N    Patient Care Team: Laurey Morale, MD as PCP - General (Family Medicine) Belva Crome, MD as PCP - Cardiology (Cardiology) Ena Dawley, MD as Consulting Physician (Obstetrics and Gynecology) Mauro Kaufmann, RN as Oncology Nurse Navigator Carlynn Spry, Charlott Holler, RN as Oncology Nurse Navigator Nicholas Lose, MD as Consulting Physician (Hematology and Oncology) Eppie Gibson, MD as Attending Physician (Radiation Oncology)  Indicate any recent Medical Services you may have received from other than [facility] providers in the past year (date may be approximate).     Assessment:   This is a routine wellness examination for Danne.  Hearing/Vision screen Hearing Screening - Comments:: No hearing difficulty Vision Screening - Comments:: Wears glasses. Followed by [place] issues and exercise activities discussed: Current Exercise Habits: Home exercise routine, Time (Minutes): 40, Frequency (Times/Week): 6, Weekly Exercise (Minutes/Week): 240, Intensity: Moderate, Exercise limited by: None identified   Goals Addressed               This Visit's Progress     Patient stated (pt-stated)        Eat right and Stay Healthy.       Depression Screen    09/09/2021    9:10 AM 09/06/2020    9:08 AM 09/06/2020    9:05 AM 07/19/2020   11:45 AM 09/06/2019    9:14 AM 01/29/2018    9:00 AM 12/18/2017    9:14 AM  PHQ 2/9 Scores  PHQ - 2 Score 0 0 0 0 0 0 0  PHQ- 9 Score    4 0      Fall Risk    09/09/2021    9:14 AM 09/06/2020    9:08 AM 07/19/2020   11:45 AM 07/19/2020   10:30 AM 09/06/2019    9:13 AM  Fall Risk   Falls in the past year? 0 0 0 0 0  Number falls in past yr: 0 0 0 0 0  Injury with Fall? 0 0 0 0 0  Risk for fall due to : No Fall Risks No Fall Risks No Fall Risks No Fall Risks Medication side effect  Follow up  Falls evaluation completed   Falls evaluation completed;Falls prevention discussed    FALL RISK PREVENTION PERTAINING TO  THE HOME:  Any stairs in or around the home? Yes  If so, are there any without handrails? No  Home free of loose throw rugs in walkways, pet beds, electrical cords, etc? Yes  Adequate lighting in your home to reduce risk of falls? Yes   ASSISTIVE DEVICES UTILIZED TO PREVENT FALLS:  Life alert? No  Use of a cane, walker or w/c? No  Grab bars in the bathroom? No  Shower chair or bench in shower? No  Elevated toilet seat or a handicapped toilet? No   TIMED UP AND GO:  Was the test performed? Yes .  Length of time to ambulate 10 feet: 10 sec.   Gait steady and fast without use of assistive device  Cognitive Function:        09/09/2021    9:16 AM  6CIT Screen  What Year? 0 points  What month? 0 points  What time? 0 points  Count back from 20 0 points  Months in reverse 0 points  Repeat phrase 0 points  Total Score 0 points    Immunizations Immunization History  Administered Date(s) Administered   Influenza-Unspecified 11/29/2019   PFIZER(Purple Top)SARS-COV-2 Vaccination 06/02/2019, 06/23/2019    TDAP status: Up to date  Flu Vaccine status: Declined, Education has been provided regarding the importance of this vaccine but patient still declined. Advised may receive this vaccine at local pharmacy or Health Dept. Aware to provide a copy of the vaccination record if obtained from local pharmacy or Health Dept. Verbalized acceptance and understanding.  Pneumococcal vaccine status: Declined,  Education has been provided regarding the importance of  this vaccine but patient still declined. Advised may receive this vaccine at local pharmacy or Health Dept. Aware to provide a copy of the vaccination record if obtained from local pharmacy or Health Dept. Verbalized acceptance and understanding.   Covid-19 vaccine status: Completed vaccines  Qualifies for Shingles Vaccine? Yes   Zostavax completed No   Shingrix Completed?: No.    Education has been provided regarding the  importance of this vaccine. Patient has been advised to call insurance company to determine out of pocket expense if they have not yet received this vaccine. Advised may also receive vaccine at local pharmacy or Health Dept. Verbalized acceptance and understanding.  Screening Tests Health Maintenance  Topic Date Due   INFLUENZA VACCINE  08/13/2021   COVID-19 Vaccine (5 - Pfizer risk series) 09/25/2021 (Originally 12/19/2020)   Zoster Vaccines- Shingrix (1 of 2) 12/10/2021 (Originally 09/30/1966)   Pneumonia Vaccine 19+ Years old (1 - PCV) 09/10/2022 (Originally 09/29/1951)   MAMMOGRAM  09/10/2022 (Originally 09/28/2019)   COLONOSCOPY (Pts 45-64yr Insurance coverage will need to be confirmed)  01/13/2025   TETANUS/TDAP  10/13/2026   DEXA SCAN  Completed   Hepatitis C Screening  Completed   HPV VACCINES  Aged Out    Health Maintenance  Health Maintenance Due  Topic Date Due   INFLUENZA VACCINE  08/13/2021    Colorectal cancer screening: Type of screening: Colonoscopy. Completed 01/14/15. Repeat every 10 years  Mammogram status: Ordered Patient deferred. Pt provided with contact info and advised to call to schedule appt.   Bone Density status: Completed 04/21/20. Results reflect: Bone density results: OSTEOPOROSIS. Repeat every   years.  Lung Cancer Screening: (Low Dose CT Chest recommended if Age 74-80years, 30 pack-year currently smoking OR have quit w/in 15years.) does not qualify.     Additional Screening:  Hepatitis C Screening: does qualify; Completed 01/17/17  Vision Screening: Recommended annual ophthalmology exams for early detection of glaucoma and other disorders of the eye. Is the patient up to date with their annual eye exam?  No Who is the provider or what is the name of the office in which the patient attends annual eye exams? TKeysIf pt is not established with a provider, would they like to be referred to a provider to establish care? No .   Dental Screening:  Recommended annual dental exams for proper oral hygiene  Community Resource Referral / Chronic Care Management:  CRR required this visit?  No   CCM required this visit?  No      Plan:     I have personally reviewed and noted the following in the patient's chart:   Medical and social history Use of alcohol, tobacco or illicit drugs  Current medications and supplements including opioid prescriptions. Patient is not currently taking opioid prescriptions. Functional ability and status Nutritional status Physical activity Advanced directives List of other physicians Hospitalizations, surgeries, and ER visits in previous 12 months Vitals Screenings to include cognitive, depression, and falls Referrals and appointments  In addition, I have reviewed and discussed with patient certain preventive protocols, quality metrics, and best practice recommendations. A written personalized care plan for preventive services as well as general preventive health recommendations were provided to patient.     BCriselda Peaches LPN   [id number]  Nurse Notes: Patient request f/u with concerns of pain and tightness in abdomen.

## 2021-09-09 NOTE — Telephone Encounter (Signed)
Left message asking pt to call [phone]. I left message letting patient know she can come in office if she perfers

## 2021-09-10 ENCOUNTER — Other Ambulatory Visit: Payer: Self-pay

## 2021-09-18 ENCOUNTER — Ambulatory Visit (INDEPENDENT_AMBULATORY_CARE_PROVIDER_SITE_OTHER): Payer: Medicare Other | Admitting: Family Medicine

## 2021-09-18 ENCOUNTER — Encounter: Payer: Self-pay | Admitting: Family Medicine

## 2021-09-18 VITALS — BP 130/80 | HR 78 | Temp 98.2°F | Wt 175.0 lb

## 2021-09-18 DIAGNOSIS — K5900 Constipation, unspecified: Secondary | ICD-10-CM

## 2021-09-18 NOTE — Progress Notes (Signed)
   Subjective:    Patient ID: Yolanda Green, female    DOB: September 14, 1945, 76 y.o.   MRN: [id number]  HPI Here to discuss her chronic GI issues, including constipation and bloating. She sees Dr. [facility]ris [facility] at [facility] for [facility]. 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 She does not recall when he recommended she have another colonoscopy, and I cannot locate this in her chart. She has used Miralax sporadically in the past with mixed results. She also had a normal gastric emptying study on 03-15-21. She tries to eat plenty of fiber and to drink water.    Review of Systems  Constitutional: Negative.   Respiratory: Negative.    Cardiovascular: Negative.   Gastrointestinal:  Positive for abdominal distention and constipation. Negative for abdominal pain, blood in stool, diarrhea, nausea and vomiting.  Genitourinary: Negative.        Objective:   Physical Exam Constitutional:      Appearance: Normal appearance. She is not ill-appearing.  Cardiovascular:     Rate and Rhythm: Normal rate and regular rhythm.     Pulses: Normal pulses.     Heart sounds: Normal heart sounds.  Pulmonary:     Effort: Pulmonary effort is normal.     Breath sounds: Normal breath sounds.  Abdominal:     General: Abdomen is flat. Bowel sounds are normal. There is no distension.     Palpations: Abdomen is soft. There is no mass.     Tenderness: There is no abdominal tenderness. There is no right CVA tenderness, left CVA tenderness, guarding or rebound.     Hernia: No hernia is present.  Neurological:     Mental Status: She is alert.           Assessment & Plan:  Chronic constipation. My main advice to her is to use the Miralax every day to keep her bowels moving forward. Recheck as needed.  Alysia Penna, MD

## 2021-09-19 ENCOUNTER — Other Ambulatory Visit: Payer: Self-pay | Admitting: Family Medicine

## 2021-09-19 DIAGNOSIS — I10 Essential (primary) hypertension: Secondary | ICD-10-CM

## 2021-10-23 DIAGNOSIS — N644 Mastodynia: Secondary | ICD-10-CM | POA: Diagnosis not present

## 2021-10-23 DIAGNOSIS — R109 Unspecified abdominal pain: Secondary | ICD-10-CM | POA: Diagnosis not present

## 2021-10-23 DIAGNOSIS — C50812 Malignant neoplasm of overlapping sites of left female breast: Secondary | ICD-10-CM | POA: Diagnosis not present

## 2021-10-23 DIAGNOSIS — Z9012 Acquired absence of left breast and nipple: Secondary | ICD-10-CM | POA: Diagnosis not present

## 2021-10-23 DIAGNOSIS — Z171 Estrogen receptor negative status [ER-]: Secondary | ICD-10-CM | POA: Diagnosis not present

## 2021-11-04 DIAGNOSIS — Z23 Encounter for immunization: Secondary | ICD-10-CM | POA: Diagnosis not present

## 2021-11-09 ENCOUNTER — Other Ambulatory Visit: Payer: Self-pay | Admitting: Family Medicine

## 2021-11-09 DIAGNOSIS — I10 Essential (primary) hypertension: Secondary | ICD-10-CM

## 2021-11-11 ENCOUNTER — Other Ambulatory Visit: Payer: Self-pay | Admitting: Family Medicine

## 2021-11-11 DIAGNOSIS — I10 Essential (primary) hypertension: Secondary | ICD-10-CM

## 2021-12-24 DIAGNOSIS — I10 Essential (primary) hypertension: Secondary | ICD-10-CM | POA: Diagnosis not present

## 2021-12-24 DIAGNOSIS — Z20828 Contact with and (suspected) exposure to other viral communicable diseases: Secondary | ICD-10-CM | POA: Diagnosis not present

## 2021-12-24 DIAGNOSIS — Z6826 Body mass index (BMI) 26.0-26.9, adult: Secondary | ICD-10-CM | POA: Diagnosis not present

## 2022-01-10 DIAGNOSIS — Z1239 Encounter for other screening for malignant neoplasm of breast: Secondary | ICD-10-CM | POA: Diagnosis not present

## 2022-01-27 ENCOUNTER — Ambulatory Visit (INDEPENDENT_AMBULATORY_CARE_PROVIDER_SITE_OTHER): Payer: Medicare Other | Admitting: Family Medicine

## 2022-01-27 ENCOUNTER — Telehealth: Payer: Self-pay | Admitting: Family Medicine

## 2022-01-27 ENCOUNTER — Encounter: Payer: Self-pay | Admitting: Family Medicine

## 2022-01-27 VITALS — BP 130/78 | HR 65 | Temp 98.0°F | Ht 64.75 in | Wt 173.0 lb

## 2022-01-27 DIAGNOSIS — G629 Polyneuropathy, unspecified: Secondary | ICD-10-CM | POA: Diagnosis not present

## 2022-01-27 DIAGNOSIS — R739 Hyperglycemia, unspecified: Secondary | ICD-10-CM

## 2022-01-27 LAB — POCT GLYCOSYLATED HEMOGLOBIN (HGB A1C): Hemoglobin A1C: 5.5 % (ref 4.0–5.6)

## 2022-01-27 NOTE — Telephone Encounter (Signed)
Scheduled CPE

## 2022-01-27 NOTE — Telephone Encounter (Signed)
Tried calling pt but no answer. Per Dr. Sarajane Jews notes:   "However I asked her to schedule a complete well exam with labs sometime soon.  Yolanda Penna, MD"  Pt only need to schedule a Physical and fast 8 hrs prior for labs but no labs were needed today.  Can you please schedule pt for a physical?

## 2022-01-27 NOTE — Telephone Encounter (Signed)
Patient states she and PCP discussed getting labs in her appointment today, requesting lab orders for the tests that were discussed.

## 2022-01-27 NOTE — Progress Notes (Signed)
   Subjective:    Patient ID: Yolanda Green, female    DOB: April 05, 1945, 77 y.o.   MRN: [id number]  HPI Here to check her glucose. She has had neuropathy in her hands and feet for years as a result of getting chemotherapy. Lately this has been getting a little worse. She wants to make sure she does not have diabetes. Her last well exam was in July 2022.  Here A1c today is 5.5.   Review of Systems  Constitutional: Negative.   Respiratory: Negative.    Cardiovascular: Negative.   Neurological:  Positive for numbness.       Objective:   Physical Exam Constitutional:      Appearance: She is well-developed.  Cardiovascular:     Rate and Rhythm: Normal rate and regular rhythm.     Pulses: Normal pulses.     Heart sounds: Normal heart sounds.  Pulmonary:     Effort: Pulmonary effort is normal.     Breath sounds: Normal breath sounds.  Neurological:     Mental Status: She is alert.           Assessment & Plan:  Her glucose is perfectly normal, so we reassured her today. However I asked her to schedule a complete well exam with labs sometime soon.  Alysia Penna, MD

## 2022-01-29 NOTE — Telephone Encounter (Signed)
Noted  

## 2022-02-19 ENCOUNTER — Encounter: Payer: Self-pay | Admitting: Family Medicine

## 2022-02-19 ENCOUNTER — Ambulatory Visit (INDEPENDENT_AMBULATORY_CARE_PROVIDER_SITE_OTHER): Payer: Medicare Other | Admitting: Family Medicine

## 2022-02-19 ENCOUNTER — Telehealth: Payer: Self-pay | Admitting: Family Medicine

## 2022-02-19 VITALS — BP 130/82 | HR 84 | Temp 98.0°F | Ht 64.75 in | Wt 174.4 lb

## 2022-02-19 DIAGNOSIS — K2100 Gastro-esophageal reflux disease with esophagitis, without bleeding: Secondary | ICD-10-CM | POA: Diagnosis not present

## 2022-02-19 DIAGNOSIS — E538 Deficiency of other specified B group vitamins: Secondary | ICD-10-CM | POA: Diagnosis not present

## 2022-02-19 DIAGNOSIS — G629 Polyneuropathy, unspecified: Secondary | ICD-10-CM | POA: Diagnosis not present

## 2022-02-19 DIAGNOSIS — K5900 Constipation, unspecified: Secondary | ICD-10-CM | POA: Diagnosis not present

## 2022-02-19 DIAGNOSIS — M810 Age-related osteoporosis without current pathological fracture: Secondary | ICD-10-CM

## 2022-02-19 DIAGNOSIS — E785 Hyperlipidemia, unspecified: Secondary | ICD-10-CM

## 2022-02-19 DIAGNOSIS — I10 Essential (primary) hypertension: Secondary | ICD-10-CM

## 2022-02-19 LAB — BASIC METABOLIC PANEL
BUN: 17 mg/dL (ref 6–23)
CO2: 26 mEq/L (ref 19–32)
Calcium: 9.7 mg/dL (ref 8.4–10.5)
Chloride: 104 mEq/L (ref 96–112)
Creatinine, Ser: 1.07 mg/dL (ref 0.40–1.20)
GFR: 51.21 mL/min — ABNORMAL LOW (ref >60.00)
Glucose, Bld: 89 mg/dL (ref 70–99)
Potassium: 4 mEq/L (ref 3.5–5.1)
Sodium: 136 mEq/L (ref 135–145)

## 2022-02-19 LAB — LIPID PANEL
Cholesterol: 184 mg/dL (ref 0–200)
HDL: 69.3 mg/dL (ref >39.00)
LDL Cholesterol: 104 mg/dL — ABNORMAL HIGH (ref 0–99)
NonHDL: 114.83
Total CHOL/HDL Ratio: 3
Triglycerides: 54 mg/dL (ref 0.0–149.0)
VLDL: 10.8 mg/dL (ref 0.0–40.0)

## 2022-02-19 LAB — HEPATIC FUNCTION PANEL
ALT: 21 U/L (ref 0–35)
AST: 19 U/L (ref 0–37)
Albumin: 4.5 g/dL (ref 3.5–5.2)
Alkaline Phosphatase: 79 U/L (ref 39–117)
Bilirubin, Direct: 0.2 mg/dL (ref 0.0–0.3)
Total Bilirubin: 0.6 mg/dL (ref 0.2–1.2)
Total Protein: 7.9 g/dL (ref 6.0–8.3)

## 2022-02-19 LAB — CBC WITH DIFFERENTIAL/PLATELET
Basophils Absolute: 0 10*3/uL (ref 0.0–0.1)
Basophils Relative: 0.6 % (ref 0.0–3.0)
Eosinophils Absolute: 0.1 10*3/uL (ref 0.0–0.7)
Eosinophils Relative: 1.3 % (ref 0.0–5.0)
HCT: 42.1 % (ref 36.0–46.0)
Hemoglobin: 14.4 g/dL (ref 12.0–15.0)
Lymphocytes Relative: 39 % (ref 12.0–46.0)
Lymphs Abs: 1.8 10*3/uL (ref 0.7–4.0)
MCHC: 34.1 g/dL (ref 30.0–36.0)
MCV: 98.4 fl (ref 78.0–100.0)
Monocytes Absolute: 0.3 10*3/uL (ref 0.1–1.0)
Monocytes Relative: 6.2 % (ref 3.0–12.0)
Neutro Abs: 2.5 10*3/uL (ref 1.4–7.7)
Neutrophils Relative %: 52.9 % (ref 43.0–77.0)
Platelets: 209 10*3/uL (ref 150.0–400.0)
RBC: 4.28 Mil/uL (ref 3.87–5.11)
RDW: 14.1 % (ref 11.5–15.5)
WBC: 4.7 10*3/uL (ref 4.0–10.5)

## 2022-02-19 LAB — VITAMIN B12: Vitamin B-12: 430 pg/mL (ref 211–911)

## 2022-02-19 LAB — TSH: TSH: 2 u[IU]/mL (ref 0.35–5.50)

## 2022-02-19 NOTE — Progress Notes (Signed)
Subjective:    Patient ID: Yolanda Green, female    DOB: 11-12-1945, 77 y.o.   MRN: [id number]  HPI Here to follow up on issues. She was here a few weeks ago for worsening neuropathy, and her A1c that day was 5.5%. she says the neuropathy waxes and wanes, and she says it does not bother her enough to warrant taking any medication for it. She tried to watch her diet, but she admits to not getting much exercise this winter. She plans to return to the gym soon. She sees [facility] and [facility] regularly. Her GERD is stable, and her BP has been well controlled.    Review of Systems  Constitutional: Negative.   HENT: Negative.    Eyes: Negative.   Respiratory: Negative.    Cardiovascular: Negative.   Gastrointestinal: Negative.   Genitourinary:  Negative for decreased urine volume, difficulty urinating, dyspareunia, dysuria, enuresis, flank pain, frequency, hematuria, pelvic pain and urgency.  Musculoskeletal: Negative.   Skin: Negative.   Neurological:  Positive for numbness. Negative for headaches.  Psychiatric/Behavioral: Negative.         Objective:   Physical Exam Constitutional:      General: She is not in acute distress.    Appearance: Normal appearance. She is well-developed.  HENT:     Head: Normocephalic and atraumatic.     Right Ear: External ear normal.     Left Ear: External ear normal.     Nose: Nose normal.     Mouth/Throat:     Pharynx: No oropharyngeal exudate.  Eyes:     General: No scleral icterus.    Conjunctiva/sclera: Conjunctivae normal.     Pupils: Pupils are equal, round, and reactive to light.  Neck:     Thyroid: No thyromegaly.     Vascular: No JVD.  Cardiovascular:     Rate and Rhythm: Normal rate and regular rhythm.     Heart sounds: Normal heart sounds. No murmur heard.    No friction rub. No gallop.  Pulmonary:     Effort: Pulmonary effort is normal. No respiratory distress.     Breath sounds: Normal breath sounds. No wheezing or  rales.  Chest:     Chest wall: No tenderness.  Abdominal:     General: Bowel sounds are normal. There is no distension.     Palpations: Abdomen is soft. There is no mass.     Tenderness: There is no abdominal tenderness. There is no guarding or rebound.  Musculoskeletal:        General: No tenderness. Normal range of motion.     Cervical back: Normal range of motion and neck supple.  Lymphadenopathy:     Cervical: No cervical adenopathy.  Skin:    General: Skin is warm and dry.     Findings: No erythema or rash.  Neurological:     Mental Status: She is alert and oriented to person, place, and time.     Cranial Nerves: No cranial nerve deficit.     Motor: No abnormal muscle tone.     Coordination: Coordination normal.     Deep Tendon Reflexes: Reflexes are normal and symmetric. Reflexes normal.  Psychiatric:        Behavior: Behavior normal.        Thought Content: Thought content normal.        Judgment: Judgment normal.           Assessment & Plan:  She seems to be doing well  overall. She will follow up with [facility] for her hx of breast cancer. We will get fasting labs today for lipids, etc. For the neuropahty, we will check a B12 level. Her HTN and GERD are stable. We will send her for another DEXA soon. We spent a total of ( 34  ) minutes reviewing records and discussing these issues.  Alysia Penna, MD

## 2022-02-19 NOTE — Telephone Encounter (Signed)
Patient states she would like a call to find out what mg. Calcium she is supposed to be taking.  She has been directed not to take it 24-48 hours before Bone Density on 02/24/22.

## 2022-02-20 NOTE — Telephone Encounter (Signed)
She should take 1500 mg of calcium daily

## 2022-02-20 NOTE — Telephone Encounter (Signed)
Called patient, message given ,voiced understanding.

## 2022-02-21 ENCOUNTER — Telehealth: Payer: Self-pay | Admitting: Family Medicine

## 2022-02-21 NOTE — Telephone Encounter (Signed)
Reviewed lab results with pt verbalized understanding 

## 2022-02-21 NOTE — Telephone Encounter (Signed)
Requesting call to discuss lab results

## 2022-02-24 ENCOUNTER — Inpatient Hospital Stay: Admission: RE | Admit: 2022-02-24 | Payer: Medicare Other | Source: Ambulatory Visit

## 2022-02-26 ENCOUNTER — Encounter: Payer: Medicare Other | Admitting: Family Medicine

## 2022-02-26 ENCOUNTER — Ambulatory Visit (INDEPENDENT_AMBULATORY_CARE_PROVIDER_SITE_OTHER)
Admission: RE | Admit: 2022-02-26 | Discharge: 2022-02-26 | Disposition: A | Discharge status: Home / Self Care | Payer: Medicare Other | Source: Ambulatory Visit | Attending: Family Medicine | Admitting: Family Medicine

## 2022-02-26 DIAGNOSIS — M810 Age-related osteoporosis without current pathological fracture: Secondary | ICD-10-CM

## 2022-03-13 ENCOUNTER — Other Ambulatory Visit: Payer: Self-pay | Admitting: Family Medicine

## 2022-03-13 DIAGNOSIS — I10 Essential (primary) hypertension: Secondary | ICD-10-CM

## 2022-03-13 DIAGNOSIS — E785 Hyperlipidemia, unspecified: Secondary | ICD-10-CM

## 2022-05-05 DIAGNOSIS — K7689 Other specified diseases of liver: Secondary | ICD-10-CM | POA: Diagnosis not present

## 2022-05-05 DIAGNOSIS — R140 Abdominal distension (gaseous): Secondary | ICD-10-CM | POA: Diagnosis not present

## 2022-05-05 DIAGNOSIS — K869 Disease of pancreas, unspecified: Secondary | ICD-10-CM | POA: Diagnosis not present

## 2022-05-05 DIAGNOSIS — K769 Liver disease, unspecified: Secondary | ICD-10-CM | POA: Diagnosis not present

## 2022-05-05 DIAGNOSIS — N289 Disorder of kidney and ureter, unspecified: Secondary | ICD-10-CM | POA: Diagnosis not present

## 2022-05-30 ENCOUNTER — Other Ambulatory Visit: Payer: Self-pay | Admitting: Family Medicine

## 2022-05-30 DIAGNOSIS — E785 Hyperlipidemia, unspecified: Secondary | ICD-10-CM

## 2022-06-12 DIAGNOSIS — I7789 Other specified disorders of arteries and arterioles: Secondary | ICD-10-CM | POA: Diagnosis not present

## 2022-06-12 DIAGNOSIS — N289 Disorder of kidney and ureter, unspecified: Secondary | ICD-10-CM | POA: Diagnosis not present

## 2022-06-12 DIAGNOSIS — K869 Disease of pancreas, unspecified: Secondary | ICD-10-CM | POA: Diagnosis not present

## 2022-06-12 DIAGNOSIS — N200 Calculus of kidney: Secondary | ICD-10-CM | POA: Diagnosis not present

## 2022-06-12 DIAGNOSIS — R140 Abdominal distension (gaseous): Secondary | ICD-10-CM | POA: Diagnosis not present

## 2022-06-12 DIAGNOSIS — K7689 Other specified diseases of liver: Secondary | ICD-10-CM | POA: Diagnosis not present

## 2022-06-12 DIAGNOSIS — K769 Liver disease, unspecified: Secondary | ICD-10-CM | POA: Diagnosis not present

## 2022-07-27 ENCOUNTER — Other Ambulatory Visit: Payer: Self-pay | Admitting: Family Medicine

## 2022-07-27 DIAGNOSIS — I10 Essential (primary) hypertension: Secondary | ICD-10-CM

## 2022-08-19 DIAGNOSIS — R140 Abdominal distension (gaseous): Secondary | ICD-10-CM | POA: Diagnosis not present

## 2022-08-27 DIAGNOSIS — R1010 Upper abdominal pain, unspecified: Secondary | ICD-10-CM | POA: Diagnosis not present

## 2022-08-27 DIAGNOSIS — Z6828 Body mass index (BMI) 28.0-28.9, adult: Secondary | ICD-10-CM | POA: Diagnosis not present

## 2022-08-27 DIAGNOSIS — Z1152 Encounter for screening for COVID-19: Secondary | ICD-10-CM | POA: Diagnosis not present

## 2022-08-29 ENCOUNTER — Ambulatory Visit: Payer: Medicare Other | Admitting: Family Medicine

## 2022-08-29 ENCOUNTER — Encounter: Payer: Self-pay | Admitting: Family Medicine

## 2022-08-29 VITALS — BP 110/78 | HR 81 | Temp 97.6°F | Wt 175.8 lb

## 2022-08-29 DIAGNOSIS — Z131 Encounter for screening for diabetes mellitus: Secondary | ICD-10-CM

## 2022-08-29 DIAGNOSIS — G629 Polyneuropathy, unspecified: Secondary | ICD-10-CM

## 2022-08-29 LAB — POCT GLYCOSYLATED HEMOGLOBIN (HGB A1C): Hemoglobin A1C: 5.4 % (ref 4.0–5.6)

## 2022-08-29 NOTE — Progress Notes (Signed)
   Subjective:    Patient ID: Yolanda Green, female    DOB: 18-Mar-1945, 77 y.o.   MRN: [id number]  HPI Here to check her blood sugar. Her A1c here in January was normal. She has had some neuropathy in her feet the past 2 years, which causes her to have numbness and tingling. This was felt to be the result of chemotherapy for her breast cancer. She has noticed lately that whenever she eats something sweet or something with a lot of carbohydrates the neuropathy gets worse for a few hours. After that it goes back to her baseline. She wants to make sure she is not diabetic. Her A1c today is 5.4%.   Review of Systems  Constitutional: Negative.   Respiratory: Negative.    Cardiovascular: Negative.   Neurological:  Positive for numbness.       Objective:   Physical Exam Constitutional:      Appearance: Normal appearance.  Cardiovascular:     Rate and Rhythm: Normal rate and regular rhythm.     Pulses: Normal pulses.     Heart sounds: Normal heart sounds.  Pulmonary:     Effort: Pulmonary effort is normal.     Breath sounds: Normal breath sounds.  Neurological:     Mental Status: She is alert.           Assessment & Plan:  I reassured her that she does not have diabetes, nor is she prediabetic. She will follow up if the neuropathy gets worse.  Gershon Crane, MD

## 2022-08-29 NOTE — Addendum Note (Signed)
Addended by: Carola Rhine on: 08/29/2022 11:34 AM   Modules accepted: Orders

## 2022-09-12 ENCOUNTER — Ambulatory Visit: Payer: Medicare Other

## 2022-09-12 DIAGNOSIS — Z0000 Encounter for general adult medical examination without abnormal findings: Secondary | ICD-10-CM

## 2022-09-12 NOTE — Patient Instructions (Signed)
Ms. Over , Thank you for taking time to come for your Medicare Wellness Visit. I appreciate your ongoing commitment to your health goals. Please review the following plan we discussed and let me know if I can assist you in the future.   Referrals/Orders/Follow-Ups/Clinician Recommendations: none  This is a list of the screening recommended for you and due dates:  Health Maintenance  Topic Date Due   Pneumonia Vaccine (1 of 1 - PCV) Never done   Mammogram  10/21/2019   Flu Shot  08/14/2022   COVID-19 Vaccine (5 - 2023-24 season) 03/13/2023*   Zoster (Shingles) Vaccine (1 of 2) 03/13/2023*   Medicare Annual Wellness Visit  09/12/2023   Colon Cancer Screening  09/29/2029   DEXA scan (bone density measurement)  Completed   Hepatitis C Screening  Completed   HPV Vaccine  Aged Out   DTaP/Tdap/Td vaccine  Discontinued  *Topic was postponed. The date shown is not the original due date.    Advanced directives: (Copy Requested) Please bring a copy of your health care power of attorney and living will to the office to be added to your chart at your convenience.  Next Medicare Annual Wellness Visit scheduled for next year: Yes  Insert Preventive Care attachment Insert FALL PREVENTION attachment if needed

## 2022-09-12 NOTE — Progress Notes (Signed)
Subjective:   Yolanda Green is a 77 y.o. female who presents for Medicare Annual (Subsequent) preventive examination.  Visit Complete: Virtual  I connected with  Yolanda Green on 09/12/22 by a audio enabled telemedicine application and verified that I am speaking with the correct person using two identifiers.  Patient Location: Home  Provider Location: Home Office  I discussed the limitations of evaluation and management by telemedicine. The patient expressed understanding and agreed to proceed.  Vital Signs: Unable to obtain new vitals due to this being a telehealth visit.  Review of Systems     Cardiac Risk Factors include: advanced age (>86men, >78 women);dyslipidemia;hypertension     Objective:    Today's Vitals   There is no height or weight on file to calculate BMI.     09/12/2022   10:16 AM 09/09/2021    9:16 AM 09/06/2020    9:07 AM 09/06/2019    9:12 AM 10/13/2018   11:31 AM 11/16/2017    6:02 PM 04/18/2016    2:01 PM  Advanced Directives  Does Patient Have a Medical Advance Directive? Yes Yes Yes Yes No No No  Type of Estate agent of [place];Living will Healthcare Power of [place];Living will Healthcare Power of [place];Living will Healthcare Power of [place];Living will     Does patient want to make changes to medical advance directive?  No - Patient declined       Copy of Healthcare Power of Attorney in Chart? No - copy requested No - copy requested No - copy requested Yes - validated most recent copy scanned in chart (See row information)     Would patient like information on creating a medical advance directive?     No - Patient declined  No - Patient declined    Current Medications (verified) Outpatient Encounter Medications as of 09/12/2022  Medication Sig   amLODipine (NORVASC) 10 MG tablet TAKE 1 TABLET BY MOUTH EVERY DAY   atorvastatin (LIPITOR) 40 MG tablet TAKE 1 TABLET BY MOUTH EVERY DAY   No  facility-administered encounter medications on file as of 09/12/2022.    Allergies (verified) Patient has no known allergies.   History: Past Medical History:  Diagnosis Date   Cancer (HCC)    Family history of breast cancer    Family history of prostate cancer    Family history of stomach cancer    Fibroids    H/O varicella    History of measles, mumps, or rubella    Hypertension    PMB (postmenopausal bleeding) 2005   Stenotic cervical os 07/08/03   Stress 06/20/09   Trichomonas 05/08/03   Yeast infection    Past Surgical History:  Procedure Laterality Date   BREAST SURGERY     COLONOSCOPY N/A 09/30/2019   per Dr. Pearletha Forge of [facility], precancerous polyps, repeat in 5 yrs   DILATION AND CURETTAGE OF UTERUS  07/08/2003   ESOPHAGOGASTRODUODENOSCOPY N/A 09/30/2019   per Dr. Pearletha Forge of [facility], esophagitis, benign gastric polyps, neg H pylori   HYSTEROSCOPY  07/08/2003   INGUINAL HERNIA REPAIR  1996   TUBAL LIGATION  1979   WISDOM TOOTH EXTRACTION     Family History  Problem Relation Age of Onset   Prostate cancer Father 37       not metastatic   Diabetes Sister    Hearing loss Son    Breast cancer Paternal Aunt 33       1 aunt with breast cancer  Depression Maternal Grandmother    Stomach cancer Paternal Grandfather 27   Social History   Socioeconomic History   Marital status: Widowed    Spouse name: Not on file   Number of children: 2   Years of education: Not on file   Highest education level: Not on file  Occupational History   Not on file  Tobacco Use   Smoking status: Never   Smokeless tobacco: Never  Vaping Use   Vaping status: Never Used  Substance and Sexual Activity   Alcohol use: Yes    Comment: occasional glass of wine   Drug use: No   Sexual activity: Not Currently    Birth control/protection: Surgical, Post-menopausal    Comment: BTL  Other Topics Concern   Not on file  Social History Narrative   Not on file   Social  Determinants of Health   Financial Resource Strain: Low Risk  (09/12/2022)   Overall Financial Resource Strain (CARDIA)    Difficulty of Paying Living Expenses: Not hard at all  Food Insecurity: No Food Insecurity (09/12/2022)   Hunger Vital Sign    Worried About Running Out of Food in the Last Year: Never true    Ran Out of Food in the Last Year: Never true  Transportation Needs: No Transportation Needs (09/12/2022)   PRAPARE - Administrator, Civil Service (Medical): No    Lack of Transportation (Non-Medical): No  Physical Activity: Sufficiently Active (09/12/2022)   Exercise Vital Sign    Days of Exercise per Week: 7 days    Minutes of Exercise per Session: 40 min  Stress: No Stress Concern Present (09/12/2022)   [employer] of Occupational Health - Occupational Stress Questionnaire    Feeling of Stress : Not at all  Social Connections: Moderately Isolated (09/12/2022)   Social Connection and Isolation Panel [NHANES]    Frequency of Communication with Friends and Family: More than three times a week    Frequency of Social Gatherings with Friends and Family: Once a week    Attends Religious Services: More than 4 times per year    Active Member of [employer] or Organizations: No    Attends Banker Meetings: Never    Marital Status: Widowed    Tobacco Counseling Counseling given: Not Answered   Clinical Intake:  Pre-visit preparation completed: Yes  Pain : No/denies pain     Nutritional Risks: None Diabetes: No  How often do you need to have someone help you when you read instructions, pamphlets, or other written materials from your doctor or pharmacy?: 1 - Never  Interpreter Needed?: No  Information entered by :: NAllen LPN   Activities of Daily Living    09/12/2022   10:12 AM  In your present state of health, do you have any difficulty performing the following activities:  Hearing? 0  Vision? 0  Difficulty concentrating or making  decisions? 0  Walking or climbing stairs? 0  Dressing or bathing? 0  Doing errands, shopping? 0  Preparing Food and eating ? N  Using the Toilet? N  In the past six months, have you accidently leaked urine? N  Do you have problems with loss of bowel control? N  Managing your Medications? N  Managing your Finances? N  Housekeeping or managing your Housekeeping? N    Patient Care Team: Nelwyn Salisbury, MD as PCP - General (Family Medicine) Lyn Records, MD (Inactive) as PCP - Cardiology (Cardiology) Kirkland Hun, MD as Consulting  Physician (Obstetrics and Gynecology) Pershing Proud, RN as Oncology Nurse Navigator Donnelly Angelica, RN as Oncology Nurse Navigator Serena Croissant, MD as Consulting Physician (Hematology and Oncology) Lonie Peak, MD as Attending Physician (Radiation Oncology)  Indicate any recent Medical Services you may have received from other than [facility] providers in the past year (date may be approximate).     Assessment:   This is a routine wellness examination for Yolanda Green.  Hearing/Vision screen Hearing Screening - Comments:: Denies hearing issues Vision Screening - Comments:: No regular eye exams, [facility]  Dietary issues and exercise activities discussed:     Goals Addressed             This Visit's Progress    Patient Stated       09/12/2022, wants to lose weight and find out about stomach problems       Depression Screen    09/12/2022   10:18 AM 02/19/2022    1:37 PM 09/09/2021    9:10 AM 09/06/2020    9:08 AM 09/06/2020    9:05 AM 07/19/2020   11:45 AM 09/06/2019    9:14 AM  PHQ 2/9 Scores  PHQ - 2 Score 0 0 0 0 0 0 0  PHQ- 9 Score 0 0    4 0    Fall Risk    09/12/2022   10:17 AM 02/19/2022    1:38 PM 09/09/2021    9:14 AM 09/06/2020    9:08 AM 07/19/2020   11:45 AM  Fall Risk   Falls in the past year? 0 0 0 0 0  Number falls in past yr: 0 0 0 0 0  Injury with Fall? 0 0 0 0 0  Risk for fall due to : Medication side effect No Fall  Risks No Fall Risks No Fall Risks No Fall Risks  Follow up Falls prevention discussed;Falls evaluation completed Falls evaluation completed  Falls evaluation completed     MEDICARE RISK AT HOME: Medicare Risk at Home Any stairs in or around the home?: Yes If so, are there any without handrails?: No Home free of loose throw rugs in walkways, pet beds, electrical cords, etc?: Yes Adequate lighting in your home to reduce risk of falls?: Yes Life alert?: No Use of a cane, walker or w/c?: No Grab bars in the bathroom?: No Shower chair or bench in shower?: No Elevated toilet seat or a handicapped toilet?: No  TIMED UP AND GO:  Was the test performed?  No    Cognitive Function:        09/12/2022   10:18 AM 09/09/2021    9:16 AM  6CIT Screen  What Year? 0 points 0 points  What month? 0 points 0 points  What time? 0 points 0 points  Count back from 20 0 points 0 points  Months in reverse 0 points 0 points  Repeat phrase 2 points 0 points  Total Score 2 points 0 points    Immunizations Immunization History  Administered Date(s) Administered   Influenza-Unspecified 11/29/2019   PFIZER Comirnaty(Gray Top)Covid-19 Tri-Sucrose Vaccine 06/02/2020   PFIZER(Purple Top)SARS-COV-2 Vaccination 06/02/2019, 06/23/2019   Pfizer Covid-19 Vaccine Bivalent Booster 51yrs & up 10/24/2020    TDAP status: Due, Education has been provided regarding the importance of this vaccine. Advised may receive this vaccine at local pharmacy or Health Dept. Aware to provide a copy of the vaccination record if obtained from local pharmacy or Health Dept. Verbalized acceptance and understanding.  Flu Vaccine status:  Declined, Education has been provided regarding the importance of this vaccine but patient still declined. Advised may receive this vaccine at local pharmacy or Health Dept. Aware to provide a copy of the vaccination record if obtained from local pharmacy or Health Dept. Verbalized acceptance and  understanding.  Pneumococcal vaccine status: Declined,  Education has been provided regarding the importance of this vaccine but patient still declined. Advised may receive this vaccine at local pharmacy or Health Dept. Aware to provide a copy of the vaccination record if obtained from local pharmacy or Health Dept. Verbalized acceptance and understanding.   Covid-19 vaccine status: Declined, Education has been provided regarding the importance of this vaccine but patient still declined. Advised may receive this vaccine at local pharmacy or Health Dept.or vaccine clinic. Aware to provide a copy of the vaccination record if obtained from local pharmacy or Health Dept. Verbalized acceptance and understanding.  Qualifies for Shingles Vaccine? Yes   Zostavax completed No   Shingrix Completed?: No.    Education has been provided regarding the importance of this vaccine. Patient has been advised to call insurance company to determine out of pocket expense if they have not yet received this vaccine. Advised may also receive vaccine at local pharmacy or Health Dept. Verbalized acceptance and understanding.  Screening Tests Health Maintenance  Topic Date Due   Pneumonia Vaccine 67+ Years old (1 of 1 - PCV) Never done   MAMMOGRAM  10/21/2019   INFLUENZA VACCINE  08/14/2022   COVID-19 Vaccine (5 - 2023-24 season) 03/13/2023 (Originally 09/13/2021)   Zoster Vaccines- Shingrix (1 of 2) 03/13/2023 (Originally 09/30/1964)   Medicare Annual Wellness (AWV)  09/12/2023   Colonoscopy  09/29/2029   DEXA SCAN  Completed   Hepatitis C Screening  Completed   HPV VACCINES  Aged Out   DTaP/Tdap/Td  Discontinued    Health Maintenance  Health Maintenance Due  Topic Date Due   Pneumonia Vaccine 76+ Years old (1 of 1 - PCV) Never done   MAMMOGRAM  10/21/2019   INFLUENZA VACCINE  08/14/2022    Colorectal cancer screening: scheduled for November  Mammogram status: Completed 11/2021. Repeat every year  Bone  Density status: Completed 02/26/2022.  Lung Cancer Screening: (Low Dose CT Chest recommended if Age 64-80 years, 20 pack-year currently smoking OR have quit w/in 15years.) does not qualify.   Lung Cancer Screening Referral: no  Additional Screening:  Hepatitis C Screening: does qualify; Completed 01/17/2017  Vision Screening: Recommended annual ophthalmology exams for early detection of glaucoma and other disorders of the eye. Is the patient up to date with their annual eye exam?  No  Who is the provider or what is the name of the office in which the patient attends annual eye exams? [facility] If pt is not established with a provider, would they like to be referred to a provider to establish care? No .   Dental Screening: Recommended annual dental exams for proper oral hygiene  Diabetic Foot Exam: n/a  Community Resource Referral / Chronic Care Management: CRR required this visit?  No   CCM required this visit?  No     Plan:     I have personally reviewed and noted the following in the patient's chart:   Medical and social history Use of alcohol, tobacco or illicit drugs  Current medications and supplements including opioid prescriptions. Patient is not currently taking opioid prescriptions. Functional ability and status Nutritional status Physical activity Advanced directives List of other physicians Hospitalizations, surgeries,  and ER visits in previous 12 months Vitals Screenings to include cognitive, depression, and falls Referrals and appointments  In addition, I have reviewed and discussed with patient certain preventive protocols, quality metrics, and best practice recommendations. A written personalized care plan for preventive services as well as general preventive health recommendations were provided to patient.     Barb Merino, LPN   04/05/4008   After Visit Summary: (Pick Up) Due to this being a telephonic visit, with patients personalized plan was  offered to patient and patient has requested to Pick up at office.  Nurse Notes: none

## 2022-09-13 ENCOUNTER — Other Ambulatory Visit: Payer: Self-pay

## 2022-09-13 DIAGNOSIS — Z20822 Contact with and (suspected) exposure to covid-19: Secondary | ICD-10-CM | POA: Diagnosis not present

## 2022-09-21 ENCOUNTER — Other Ambulatory Visit: Payer: Self-pay | Admitting: Family Medicine

## 2022-09-21 DIAGNOSIS — E785 Hyperlipidemia, unspecified: Secondary | ICD-10-CM

## 2022-11-13 DIAGNOSIS — C50812 Malignant neoplasm of overlapping sites of left female breast: Secondary | ICD-10-CM | POA: Diagnosis not present

## 2022-11-13 DIAGNOSIS — Z171 Estrogen receptor negative status [ER-]: Secondary | ICD-10-CM | POA: Diagnosis not present

## 2022-11-13 DIAGNOSIS — R92321 Mammographic fibroglandular density, right breast: Secondary | ICD-10-CM | POA: Diagnosis not present

## 2022-11-21 ENCOUNTER — Other Ambulatory Visit: Payer: Self-pay | Admitting: Family Medicine

## 2022-11-21 DIAGNOSIS — I10 Essential (primary) hypertension: Secondary | ICD-10-CM

## 2022-12-01 DIAGNOSIS — E119 Type 2 diabetes mellitus without complications: Secondary | ICD-10-CM | POA: Diagnosis not present

## 2022-12-01 DIAGNOSIS — K317 Polyp of stomach and duodenum: Secondary | ICD-10-CM | POA: Diagnosis not present

## 2022-12-01 DIAGNOSIS — E785 Hyperlipidemia, unspecified: Secondary | ICD-10-CM | POA: Diagnosis not present

## 2022-12-01 DIAGNOSIS — Z1211 Encounter for screening for malignant neoplasm of colon: Secondary | ICD-10-CM | POA: Diagnosis not present

## 2022-12-01 DIAGNOSIS — K209 Esophagitis, unspecified without bleeding: Secondary | ICD-10-CM | POA: Diagnosis not present

## 2022-12-01 DIAGNOSIS — Z09 Encounter for follow-up examination after completed treatment for conditions other than malignant neoplasm: Secondary | ICD-10-CM | POA: Diagnosis not present

## 2022-12-01 DIAGNOSIS — K5730 Diverticulosis of large intestine without perforation or abscess without bleeding: Secondary | ICD-10-CM | POA: Diagnosis not present

## 2022-12-01 DIAGNOSIS — Z860101 Personal history of adenomatous and serrated colon polyps: Secondary | ICD-10-CM | POA: Diagnosis not present

## 2022-12-01 DIAGNOSIS — D123 Benign neoplasm of transverse colon: Secondary | ICD-10-CM | POA: Diagnosis not present

## 2022-12-01 DIAGNOSIS — R140 Abdominal distension (gaseous): Secondary | ICD-10-CM | POA: Diagnosis not present

## 2022-12-01 DIAGNOSIS — I10 Essential (primary) hypertension: Secondary | ICD-10-CM | POA: Diagnosis not present

## 2022-12-01 DIAGNOSIS — Z79899 Other long term (current) drug therapy: Secondary | ICD-10-CM | POA: Diagnosis not present

## 2022-12-03 DIAGNOSIS — K911 Postgastric surgery syndromes: Secondary | ICD-10-CM | POA: Diagnosis not present

## 2022-12-03 DIAGNOSIS — R140 Abdominal distension (gaseous): Secondary | ICD-10-CM | POA: Diagnosis not present

## 2022-12-03 DIAGNOSIS — R1013 Epigastric pain: Secondary | ICD-10-CM | POA: Diagnosis not present

## 2022-12-06 ENCOUNTER — Other Ambulatory Visit: Payer: Self-pay | Admitting: Family Medicine

## 2022-12-06 DIAGNOSIS — E785 Hyperlipidemia, unspecified: Secondary | ICD-10-CM

## 2023-01-11 DIAGNOSIS — U071 COVID-19: Secondary | ICD-10-CM | POA: Diagnosis not present

## 2023-01-15 DIAGNOSIS — U071 COVID-19: Secondary | ICD-10-CM | POA: Diagnosis not present

## 2023-01-18 ENCOUNTER — Other Ambulatory Visit: Payer: Self-pay | Admitting: Family Medicine

## 2023-01-18 DIAGNOSIS — I10 Essential (primary) hypertension: Secondary | ICD-10-CM

## 2023-02-27 DIAGNOSIS — R140 Abdominal distension (gaseous): Secondary | ICD-10-CM | POA: Diagnosis not present

## 2023-03-09 ENCOUNTER — Other Ambulatory Visit: Payer: Self-pay

## 2023-06-04 DIAGNOSIS — Z171 Estrogen receptor negative status [ER-]: Secondary | ICD-10-CM | POA: Diagnosis not present

## 2023-06-04 DIAGNOSIS — Z853 Personal history of malignant neoplasm of breast: Secondary | ICD-10-CM | POA: Diagnosis not present

## 2023-06-04 DIAGNOSIS — N6320 Unspecified lump in the left breast, unspecified quadrant: Secondary | ICD-10-CM | POA: Diagnosis not present

## 2023-06-04 DIAGNOSIS — C50812 Malignant neoplasm of overlapping sites of left female breast: Secondary | ICD-10-CM | POA: Diagnosis not present

## 2023-06-04 DIAGNOSIS — Z1231 Encounter for screening mammogram for malignant neoplasm of breast: Secondary | ICD-10-CM | POA: Diagnosis not present

## 2023-06-04 DIAGNOSIS — N6324 Unspecified lump in the left breast, lower inner quadrant: Secondary | ICD-10-CM | POA: Diagnosis not present

## 2023-07-03 DIAGNOSIS — C50812 Malignant neoplasm of overlapping sites of left female breast: Secondary | ICD-10-CM | POA: Diagnosis not present

## 2023-07-03 DIAGNOSIS — Z171 Estrogen receptor negative status [ER-]: Secondary | ICD-10-CM | POA: Diagnosis not present

## 2023-07-03 DIAGNOSIS — N6324 Unspecified lump in the left breast, lower inner quadrant: Secondary | ICD-10-CM | POA: Diagnosis not present

## 2023-07-03 DIAGNOSIS — N6320 Unspecified lump in the left breast, unspecified quadrant: Secondary | ICD-10-CM | POA: Diagnosis not present

## 2023-07-24 DIAGNOSIS — J029 Acute pharyngitis, unspecified: Secondary | ICD-10-CM | POA: Diagnosis not present

## 2023-10-13 ENCOUNTER — Other Ambulatory Visit: Payer: Self-pay | Admitting: Family Medicine

## 2023-10-13 DIAGNOSIS — I10 Essential (primary) hypertension: Secondary | ICD-10-CM

## 2023-11-06 DIAGNOSIS — Z1231 Encounter for screening mammogram for malignant neoplasm of breast: Secondary | ICD-10-CM | POA: Diagnosis not present

## 2023-11-06 DIAGNOSIS — R92321 Mammographic fibroglandular density, right breast: Secondary | ICD-10-CM | POA: Diagnosis not present

## 2023-12-16 ENCOUNTER — Other Ambulatory Visit: Payer: Self-pay | Admitting: Family Medicine

## 2023-12-16 DIAGNOSIS — E785 Hyperlipidemia, unspecified: Secondary | ICD-10-CM

## 2024-01-07 ENCOUNTER — Other Ambulatory Visit: Payer: Self-pay | Admitting: Family Medicine

## 2024-01-07 DIAGNOSIS — E785 Hyperlipidemia, unspecified: Secondary | ICD-10-CM

## 2024-02-17 ENCOUNTER — Other Ambulatory Visit: Payer: Self-pay | Admitting: Family Medicine

## 2024-02-17 DIAGNOSIS — I10 Essential (primary) hypertension: Secondary | ICD-10-CM
